# Patient Record
Sex: Male | Born: 1955 | Race: White | Hispanic: No | Smoking: Never smoker
Health system: Southern US, Community
[De-identification: ages and names within clinical notes are randomized; demographics above are authoritative.]

## PROBLEM LIST (undated history)

## (undated) DIAGNOSIS — F329 Major depressive disorder, single episode, unspecified: Secondary | ICD-10-CM

## (undated) DIAGNOSIS — R600 Localized edema: Secondary | ICD-10-CM

## (undated) DIAGNOSIS — Z9189 Other specified personal risk factors, not elsewhere classified: Secondary | ICD-10-CM

## (undated) DIAGNOSIS — Z8673 Personal history of transient ischemic attack (TIA), and cerebral infarction without residual deficits: Secondary | ICD-10-CM

## (undated) DIAGNOSIS — I319 Disease of pericardium, unspecified: Secondary | ICD-10-CM

## (undated) DIAGNOSIS — D126 Benign neoplasm of colon, unspecified: Secondary | ICD-10-CM

## (undated) DIAGNOSIS — E785 Hyperlipidemia, unspecified: Secondary | ICD-10-CM

## (undated) DIAGNOSIS — C801 Malignant (primary) neoplasm, unspecified: Secondary | ICD-10-CM

## (undated) DIAGNOSIS — M545 Low back pain, unspecified: Secondary | ICD-10-CM

## (undated) DIAGNOSIS — I10 Essential (primary) hypertension: Principal | ICD-10-CM

## (undated) DIAGNOSIS — K760 Fatty (change of) liver, not elsewhere classified: Secondary | ICD-10-CM

## (undated) DIAGNOSIS — M5136 Other intervertebral disc degeneration, lumbar region: Secondary | ICD-10-CM

## (undated) DIAGNOSIS — N529 Male erectile dysfunction, unspecified: Secondary | ICD-10-CM

## (undated) DIAGNOSIS — M51369 Other intervertebral disc degeneration, lumbar region without mention of lumbar back pain or lower extremity pain: Secondary | ICD-10-CM

## (undated) DIAGNOSIS — F418 Other specified anxiety disorders: Principal | ICD-10-CM

## (undated) HISTORY — PX: COLONOSCOPY: SHX174

## (undated) HISTORY — DX: Other specified anxiety disorders: F41.8

## (undated) HISTORY — PX: TONSILLECTOMY: SUR1361

## (undated) HISTORY — DX: Hyperlipidemia, unspecified: E78.5

## (undated) HISTORY — PX: OTHER SURGICAL HISTORY: SHX169

## (undated) HISTORY — DX: Benign neoplasm of colon, unspecified: D12.6

## (undated) HISTORY — DX: Localized edema: R60.0

## (undated) HISTORY — DX: Malignant (primary) neoplasm, unspecified: C80.1

## (undated) HISTORY — DX: Other intervertebral disc degeneration, lumbar region: M51.36

## (undated) HISTORY — DX: Other specified personal risk factors, not elsewhere classified: Z91.89

## (undated) HISTORY — DX: Fatty (change of) liver, not elsewhere classified: K76.0

## (undated) HISTORY — DX: Essential (primary) hypertension: I10

## (undated) HISTORY — DX: Disease of pericardium, unspecified: I31.9

## (undated) HISTORY — DX: Personal history of transient ischemic attack (TIA), and cerebral infarction without residual deficits: Z86.73

## (undated) HISTORY — DX: Male erectile dysfunction, unspecified: N52.9

## (undated) HISTORY — DX: Major depressive disorder, single episode, unspecified: F32.9

## (undated) HISTORY — DX: Low back pain, unspecified: M54.50

## (undated) HISTORY — DX: Other intervertebral disc degeneration, lumbar region without mention of lumbar back pain or lower extremity pain: M51.369

---

## 1984-05-22 DIAGNOSIS — I319 Disease of pericardium, unspecified: Secondary | ICD-10-CM

## 1984-05-22 HISTORY — DX: Disease of pericardium, unspecified: I31.9

## 2016-01-13 ENCOUNTER — Telehealth: Payer: Self-pay | Admitting: Behavioral Health

## 2016-01-13 NOTE — Telephone Encounter (Signed)
Patient declined to provide pre-visit info today, voicing that he will update all info at the appointment tomorrow.

## 2016-01-14 ENCOUNTER — Ambulatory Visit (INDEPENDENT_AMBULATORY_CARE_PROVIDER_SITE_OTHER): Payer: Managed Care, Other (non HMO) | Admitting: Family Medicine

## 2016-01-14 ENCOUNTER — Encounter: Payer: Self-pay | Admitting: Family Medicine

## 2016-01-14 DIAGNOSIS — I10 Essential (primary) hypertension: Secondary | ICD-10-CM

## 2016-01-14 DIAGNOSIS — F329 Major depressive disorder, single episode, unspecified: Secondary | ICD-10-CM | POA: Diagnosis not present

## 2016-01-14 DIAGNOSIS — G4700 Insomnia, unspecified: Secondary | ICD-10-CM | POA: Diagnosis not present

## 2016-01-14 DIAGNOSIS — F32A Depression, unspecified: Secondary | ICD-10-CM

## 2016-01-14 HISTORY — DX: Depression, unspecified: F32.A

## 2016-01-14 HISTORY — DX: Essential (primary) hypertension: I10

## 2016-01-14 MED ORDER — BUPROPION HCL 100 MG PO TABS: 100.0000 mg | ORAL_TABLET | Freq: Two times a day (BID) | ORAL | 3 refills | Status: DC

## 2016-01-14 MED ORDER — ZOLPIDEM TARTRATE 5 MG PO TABS: 5.0000 mg | ORAL_TABLET | Freq: Every evening | ORAL | 2 refills | Status: DC | PRN

## 2016-01-14 MED ORDER — LISINOPRIL 40 MG PO TABS: 40.0000 mg | ORAL_TABLET | Freq: Every day | ORAL | 3 refills | Status: DC

## 2016-01-14 NOTE — Progress Notes (Signed)
Pre visit review using our clinic review tool, if applicable. No additional management support is needed unless otherwise documented below in the visit note. 

## 2016-01-14 NOTE — Patient Instructions (Addendum)
Drink a big glass of water, maybe add Metamucil, before eating and eat slow. Bananas and pineapple have considerable amounts of sugar so eat these in moderation. White potatoes are not considered a "healthy" vegetable. Another option for portion control is to get a food scale and weigh your portions.  Consider water aerobics or cycling to help with exercise.  EXERCISES  RANGE OF MOTION (ROM) AND STRETCHING EXERCISES - Low Back Sprain Most people with lower back pain will find that their symptoms get worse with excessive bending forward (flexion) or arching at the lower back (extension). The exercises that will help resolve your symptoms will focus on the opposite motion.  Your physician, physical therapist or athletic trainer will help you determine which exercises will be most helpful to resolve your lower back pain. Do not complete any exercises without first consulting with your caregiver. Discontinue any exercises which make your symptoms worse, until you speak to your caregiver. If you have pain, numbness or tingling which travels down into your buttocks, leg or foot, the goal of the therapy is for these symptoms to move closer to your back and eventually resolve. Sometimes, these leg symptoms will get better, but your lower back pain may worsen. This is often an indication of progress in your rehabilitation. Be very alert to any changes in your symptoms and the activities in which you participated in the 24 hours prior to the change. Sharing this information with your caregiver will allow him or her to most efficiently treat your condition. These exercises may help you when beginning to rehabilitate your injury. Your symptoms may resolve with or without further involvement from your physician, physical therapist or athletic trainer. While completing these exercises, remember:   Restoring tissue flexibility helps normal motion to return to the joints. This allows healthier, less painful movement  and activity.  An effective stretch should be held for at least 30 seconds.  A stretch should never be painful. You should only feel a gentle lengthening or release in the stretched tissue. FLEXION RANGE OF MOTION AND STRETCHING EXERCISES:  STRETCH - Flexion, Single Knee to Chest   Lie on a firm bed or floor with both legs extended in front of you.  Keeping one leg in contact with the floor, bring your opposite knee to your chest. Hold your leg in place by either grabbing behind your thigh or at your knee.  Pull until you feel a gentle stretch in your low back. Hold __________ seconds.  Slowly release your grasp and repeat the exercise with the opposite side. Repeat __________ times. Complete this exercise __________ times per day.   STRETCH - Flexion, Double Knee to Chest  Lie on a firm bed or floor with both legs extended in front of you.  Keeping one leg in contact with the floor, bring your opposite knee to your chest.  Tense your stomach muscles to support your back and then lift your other knee to your chest. Hold your legs in place by either grabbing behind your thighs or at your knees.  Pull both knees toward your chest until you feel a gentle stretch in your low back. Hold __________ seconds.  Tense your stomach muscles and slowly return one leg at a time to the floor. Repeat __________ times. Complete this exercise __________ times per day.   STRETCH - Low Trunk Rotation  Lie on a firm bed or floor. Keeping your legs in front of you, bend your knees so they are both pointed toward  the ceiling and your feet are flat on the floor.  Extend your arms out to the side. This will stabilize your upper body by keeping your shoulders in contact with the floor.  Gently and slowly drop both knees together to one side until you feel a gentle stretch in your low back. Hold for __________ seconds.  Tense your stomach muscles to support your lower back as you bring your knees back to  the starting position. Repeat the exercise to the other side. Repeat __________ times. Complete this exercise __________ times per day  EXTENSION RANGE OF MOTION AND FLEXIBILITY EXERCISES:  STRETCH - Extension, Prone on Elbows   Lie on your stomach on the floor, a bed will be too soft. Place your palms about shoulder width apart and at the height of your head.  Place your elbows under your shoulders. If this is too painful, stack pillows under your chest.  Allow your body to relax so that your hips drop lower and make contact more completely with the floor.  Hold this position for __________ seconds.  Slowly return to lying flat on the floor. Repeat __________ times. Complete this exercise __________ times per day.   RANGE OF MOTION - Extension, Prone Press Ups  Lie on your stomach on the floor, a bed will be too soft. Place your palms about shoulder width apart and at the height of your head.  Keeping your back as relaxed as possible, slowly straighten your elbows while keeping your hips on the floor. You may adjust the placement of your hands to maximize your comfort. As you gain motion, your hands will come more underneath your shoulders.  Hold this position __________ seconds.  Slowly return to lying flat on the floor. Repeat __________ times. Complete this exercise __________ times per day.   RANGE OF MOTION- Quadruped, Neutral Spine   Assume a hands and knees position on a firm surface. Keep your hands under your shoulders and your knees under your hips. You may place padding under your knees for comfort.  Drop your head and point your tailbone toward the ground below you. This will round out your lower back like an angry cat. Hold this position for __________ seconds.  Slowly lift your head and release your tail bone so that your back sags into a large arch, like an old horse.  Hold this position for __________ seconds.  Repeat this until you feel limber in your low  back.  Now, find your "sweet spot." This will be the most comfortable position somewhere between the two previous positions. This is your neutral spine. Once you have found this position, tense your stomach muscles to support your low back.  Hold this position for __________ seconds. Repeat __________ times. Complete this exercise __________ times per day.  STRENGTHENING EXERCISES - Low Back Sprain These exercises may help you when beginning to rehabilitate your injury. These exercises should be done near your "sweet spot." This is the neutral, low-back arch, somewhere between fully rounded and fully arched, that is your least painful position. When performed in this safe range of motion, these exercises can be used for people who have either a flexion or extension based injury. These exercises may resolve your symptoms with or without further involvement from your physician, physical therapist or athletic trainer. While completing these exercises, remember:   Muscles can gain both the endurance and the strength needed for everyday activities through controlled exercises.  Complete these exercises as instructed by your physician, physical  therapist or athletic trainer. Increase the resistance and repetitions only as guided.  You may experience muscle soreness or fatigue, but the pain or discomfort you are trying to eliminate should never worsen during these exercises. If this pain does worsen, stop and make certain you are following the directions exactly. If the pain is still present after adjustments, discontinue the exercise until you can discuss the trouble with your caregiver.  STRENGTHENING - Deep Abdominals, Pelvic Tilt   Lie on a firm bed or floor. Keeping your legs in front of you, bend your knees so they are both pointed toward the ceiling and your feet are flat on the floor.  Tense your lower abdominal muscles to press your low back into the floor. This motion will rotate your pelvis so  that your tail bone is scooping upwards rather than pointing at your feet or into the floor. With a gentle tension and even breathing, hold this position for __________ seconds. Repeat __________ times. Complete this exercise __________ times per day.   STRENGTHENING - Abdominals, Crunches   Lie on a firm bed or floor. Keeping your legs in front of you, bend your knees so they are both pointed toward the ceiling and your feet are flat on the floor. Cross your arms over your chest.  Slightly tip your chin down without bending your neck.  Tense your abdominals and slowly lift your trunk high enough to just clear your shoulder blades. Lifting higher can put excessive stress on the lower back and does not further strengthen your abdominal muscles.  Control your return to the starting position. Repeat __________ times. Complete this exercise __________ times per day.   STRENGTHENING - Quadruped, Opposite UE/LE Lift   Assume a hands and knees position on a firm surface. Keep your hands under your shoulders and your knees under your hips. You may place padding under your knees for comfort.  Find your neutral spine and gently tense your abdominal muscles so that you can maintain this position. Your shoulders and hips should form a rectangle that is parallel with the floor and is not twisted.  Keeping your trunk steady, lift your right hand no higher than your shoulder and then your left leg no higher than your hip. Make sure you are not holding your breath. Hold this position for __________ seconds.  Continuing to keep your abdominal muscles tense and your back steady, slowly return to your starting position. Repeat with the opposite arm and leg. Repeat __________ times. Complete this exercise __________ times per day.   STRENGTHENING - Abdominals and Quadriceps, Straight Leg Raise   Lie on a firm bed or floor with both legs extended in front of you.  Keeping one leg in contact with the floor,  bend the other knee so that your foot can rest flat on the floor.  Find your neutral spine, and tense your abdominal muscles to maintain your spinal position throughout the exercise.  Slowly lift your straight leg off the floor about 6 inches for a count of 15, making sure to not hold your breath.  Still keeping your neutral spine, slowly lower your leg all the way to the floor. Repeat this exercise with each leg __________ times. Complete this exercise __________ times per day. POSTURE AND BODY MECHANICS CONSIDERATIONS - Low Back Sprain Keeping correct posture when sitting, standing or completing your activities will reduce the stress put on different body tissues, allowing injured tissues a chance to heal and limiting painful experiences. The following  are general guidelines for improved posture. Your physician or physical therapist will provide you with any instructions specific to your needs. While reading these guidelines, remember:  The exercises prescribed by your provider will help you have the flexibility and strength to maintain correct postures.  The correct posture provides the best environment for your joints to work. All of your joints have less wear and tear when properly supported by a spine with good posture. This means you will experience a healthier, less painful body.  Correct posture must be practiced with all of your activities, especially prolonged sitting and standing. Correct posture is as important when doing repetitive low-stress activities (typing) as it is when doing a single heavy-load activity (lifting).  RESTING POSITIONS Consider which positions are most painful for you when choosing a resting position. If you have pain with flexion-based activities (sitting, bending, stooping, squatting), choose a position that allows you to rest in a less flexed posture. You would want to avoid curling into a fetal position on your side. If your pain worsens with extension-based  activities (prolonged standing, working overhead), avoid resting in an extended position such as sleeping on your stomach. Most people will find more comfort when they rest with their spine in a more neutral position, neither too rounded nor too arched. Lying on a non-sagging bed on your side with a pillow between your knees, or on your back with a pillow under your knees will often provide some relief. Keep in mind, being in any one position for a prolonged period of time, no matter how correct your posture, can still lead to stiffness. PROPER SITTING POSTURE In order to minimize stress and discomfort on your spine, you must sit with correct posture. Sitting with good posture should be effortless for a healthy body. Returning to good posture is a gradual process. Many people can work toward this most comfortably by using various supports until they have the flexibility and strength to maintain this posture on their own. When sitting with proper posture, your ears will fall over your shoulders and your shoulders will fall over your hips. You should use the back of the chair to support your upper back. Your lower back will be in a neutral position, just slightly arched. You may place a small pillow or folded towel at the base of your lower back for  support.  When working at a desk, create an environment that supports good, upright posture. Without extra support, muscles tire, which leads to excessive strain on joints and other tissues. Keep these recommendations in mind:  CHAIR:  A chair should be able to slide under your desk when your back makes contact with the back of the chair. This allows you to work closely.  The chair's height should allow your eyes to be level with the upper part of your monitor and your hands to be slightly lower than your elbows.  BODY POSITION  Your feet should make contact with the floor. If this is not possible, use a foot rest.  Keep your ears over your shoulders.  This will reduce stress on your neck and low back.  INCORRECT SITTING POSTURES  If you are feeling tired and unable to assume a healthy sitting posture, do not slouch or slump. This puts excessive strain on your back tissues, causing more damage and pain. Healthier options include:  Using more support, like a lumbar pillow.  Switching tasks to something that requires you to be upright or walking.  Talking a brief  walk.  Lying down to rest in a neutral-spine position.  PROLONGED STANDING WHILE SLIGHTLY LEANING FORWARD  When completing a task that requires you to lean forward while standing in one place for a long time, place either foot up on a stationary 2-4 inch high object to help maintain the best posture. When both feet are on the ground, the lower back tends to lose its slight inward curve. If this curve flattens (or becomes too large), then the back and your other joints will experience too much stress, tire more quickly, and can cause pain.  CORRECT STANDING POSTURES Proper standing posture should be assumed with all daily activities, even if they only take a few moments, like when brushing your teeth. As in sitting, your ears should fall over your shoulders and your shoulders should fall over your hips. You should keep a slight tension in your abdominal muscles to brace your spine. Your tailbone should point down to the ground, not behind your body, resulting in an over-extended swayback posture.   INCORRECT STANDING POSTURES  Common incorrect standing postures include a forward head, locked knees and/or an excessive swayback. WALKING Walk with an upright posture. Your ears, shoulders and hips should all line-up.  PROLONGED ACTIVITY IN A FLEXED POSITION When completing a task that requires you to bend forward at your waist or lean over a low surface, try to find a way to stabilize 3 out of 4 of your limbs. You can place a hand or elbow on your thigh or rest a knee on the surface you  are reaching across. This will provide you more stability, so that your muscles do not tire as quickly. By keeping your knees relaxed, or slightly bent, you will also reduce stress across your lower back. CORRECT LIFTING TECHNIQUES  DO :  Assume a wide stance. This will provide you more stability and the opportunity to get as close as possible to the object which you are lifting.  Tense your abdominals to brace your spine. Bend at the knees and hips. Keeping your back locked in a neutral-spine position, lift using your leg muscles. Lift with your legs, keeping your back straight.  Test the weight of unknown objects before attempting to lift them.  Try to keep your elbows locked down at your sides in order get the best strength from your shoulders when carrying an object.     Always ask for help when lifting heavy or awkward objects. INCORRECT LIFTING TECHNIQUES DO NOT:   Lock your knees when lifting, even if it is a small object.  Bend and twist. Pivot at your feet or move your feet when needing to change directions.  Assume that you can safely pick up even a paperclip without proper posture.  This information is not intended to replace advice given to you by your health care provider. Make sure you discuss any questions you have with your health care provider.  Document Released: 05/08/2005 Document Revised: 05/29/2014 Document Reviewed: 08/20/2008 Elsevier Interactive Patient Education [employer].

## 2016-01-14 NOTE — Progress Notes (Signed)
Chief Complaint  Patient presents with  . New Patient (Initial Visit)       New Patient Visit SUBJECTIVE: HPI: Javier King is an 60 y.o.male who is being seen for establishing care.  The patient was previously seen at [facility].  Health maintenance history: PSA: Unsure Colonoscopy: Done in 2013; normal due in 2023 Routine blood work: Earlier in the year, unsure what he checked HIV/Hep C screening: Has been screened for HIV, unsure about Hep C Tetanus: Unsure, would like to wait for the records  Concerns: None at this time.  Hypertension Patient presents for hypertension follow up. He does monitor home blood pressures - hasn't been taking medications routinely since his move.  He is compliant with medication- Lisinopril 40 mg daily. Patient has these side effects of medication: none He specifically denies headache, visual changes, chest pain, palpitations, dyspnea, orthopnea, PND or peripheral edema. He is adhering to a low sodium and low fat diet. He exercises rarely.  ROS:  Psych: +racing thoughts Heart: +LE swelling, no chest pain Lungs: No SOB  Past Medical History:  Diagnosis Date  . Depression 01/14/2016  . Essential hypertension 01/14/2016   Past Surgical History:  Procedure Laterality Date  . OTHER SURGICAL HISTORY  2003   Back surgery   Social History   Social History  . Marital status: Divorced   Social History Main Topics  . Smoking status: Never Smoker  . Smokeless tobacco: Never Used  . Alcohol use 1.8 oz/week    1 Glasses of wine, 1 Cans of beer, 1 Shots of liquor per week     Comment: occassionall  . Drug use: No  . Sexual activity: Not Currently   No family history on file.   Current Outpatient Prescriptions:  .  buPROPion (WELLBUTRIN) 100 MG tablet, Take 1 tablet (100 mg total) by mouth 2 (two) times daily., Disp: 180 tablet, Rfl: 3 .  lisinopril (PRINIVIL,ZESTRIL) 40 MG tablet, Take 1 tablet (40 mg total) by mouth daily., Disp: 90 tablet,  Rfl: 3 .  zolpidem (AMBIEN) 5 MG tablet, Take 1 tablet (5 mg total) by mouth at bedtime as needed for sleep., Disp: 30 tablet, Rfl: 2  ROS Consitutional: Denies fevers, chills  Cardiovascular: Denies chest pain or pressure, palpitations  Respiratory: Denies dyspnea, cough   OBJECTIVE: BP (!) 155/80 (BP Location: Right Arm, Cuff Size: Large)   Pulse 73   Temp 98.4 F (36.9 C) (Oral)   Resp 17   Ht 6' (1.829 m)   Wt 268 lb 9.6 oz (121.8 kg)   SpO2 98%   BMI 36.43 kg/m   Constitutional: -  VS reviewed -  Well developed, well nourished, appears stated age -  No apparent distress  Psychiatric: -  Oriented to person, place, and time -  Memory intact -  Affect and mood normal -  Fluent conversation, good eye contact -  Judgment and insight age appropriate  ENMT: -  Ears are patent b/l without erythema or discharge. TM's are shiny and clear b/l without evidence of effusion or infection. -  Oral mucosa without lesions, tongue and uvula midline    Tonsils not enlarged, no erythema, no exudate, trachea midline    Pharynx moist, no lesions, no erythema  Neck: -  No gross swelling, no palpable masses -  Thyroid midline, not enlarged, mobile, no palpable masses  Cardiovascular: -  RRR, no murmurs -  1+ pitting edema b/l up to distal 1/3 of tibia b/l  Respiratory: -  Normal  respiratory effort, no accessory muscle use, no retraction -  Breath sounds equal, no wheezes, no ronchi, no crackles  Musculoskeletal: -  No clubbing, no cyanosis -  Gait normal  Skin: -  No significant lesion on inspection -  Warm and dry to palpation   ASSESSMENT/PLAN: Essential hypertension  Depression  Insomnia  Patient instructed to sign release of records form for her previous PCP. Will hold off on labs and imms for now. BP is elevated, but he has not been taking his medications routinely. Discussed his long-term use of Ambien. He uses it on weeknights due to racing thoughts and need for sleep. He has  been using it for 10 years. I stated that I will not stop him cold [place] today, but would like him to consider other options for sleep in the future. I suspect he has an anxiety component that is not adequately treated causing this. He did agree to consider this and voiced understanding of my future vision of his health. Patient should return in 4 weeks to recheck BP. The patient voiced understanding and agreement to the plan.   [place]

## 2016-01-20 ENCOUNTER — Telehealth: Payer: Self-pay | Admitting: Family Medicine

## 2016-01-20 ENCOUNTER — Encounter: Payer: Self-pay | Admitting: Family Medicine

## 2016-01-20 DIAGNOSIS — D369 Benign neoplasm, unspecified site: Secondary | ICD-10-CM | POA: Insufficient documentation

## 2016-01-20 DIAGNOSIS — Z9189 Other specified personal risk factors, not elsewhere classified: Secondary | ICD-10-CM | POA: Insufficient documentation

## 2016-01-20 DIAGNOSIS — E785 Hyperlipidemia, unspecified: Secondary | ICD-10-CM

## 2016-01-20 HISTORY — DX: Other specified personal risk factors, not elsewhere classified: Z91.89

## 2016-01-20 NOTE — Telephone Encounter (Signed)
Review of Medical Records  Javier King is a 60 y.o. male last seen on 01/14/2016 to establish with my practice as a new patient.  Javier King has a medical history and records which I felt required additional time spent outside of his office visit for the purpose of reviewing his records.  Available records were reviewed in detail and updates to Javier King medical records include:  Update problem list: Lumbar DDD- XR 01/16/14 showed this Erectile dysfunction History of TIA/Stroke Hx of abnormal liver enzymes Hyperlipidemia Tubular adenoma of colon (scope done 02/17/10)-needed 5 year follow up, not 10; according to his records, he was due in 01/2015; the polyp was found in the descending colon.  Famhx Mother- heart disease Sibilings: DM II, leukemia  Allergies: PCN  Caffeine intake: 2-3 cups of coffee daily  Medication list Cialis 5 mg q 24 hrs  Flonase 50 mcg, 2 sprays each nostril daily Gabapentin 300 mg TID He has tried and failed Celexa for his anxiety/depression  Labs: CMP and CBC unremarkable; lipid profile showed HDL of 61, TG's of 158, and LDL of 124, 10 yr CVD is >7.5% combined with his BP at his visit with me.   Past Medical History:  Diagnosis Date  . 10 year risk of MI or stroke 7.5% or greater 01/20/2016  . DDD (degenerative disc disease), lumbar   . Depression 01/14/2016  . Erectile dysfunction   . Essential hypertension 01/14/2016  . History of TIA (transient ischemic attack)   . Hyperlipidemia   . Tubular adenoma of colon    02/17/10    Past Surgical History:  Procedure Laterality Date  . OTHER SURGICAL HISTORY  2003   Back surgery    Family History  Problem Relation Age of Onset  . Heart disease Mother   . Diabetes Sister     Social History   Social History  . Marital status: Divorced   Social History Main Topics  . Smoking status: Never Smoker  . Smokeless tobacco: Never Used  . Alcohol use 1.8 oz/week    1 Glasses of wine, 1 Cans of beer, 1 Shots  of liquor per week     Comment: occassionall  . Drug use: No  . Sexual activity: Not Currently    Current Outpatient Prescriptions on File Prior to Visit  Medication Sig Dispense Refill  . buPROPion (WELLBUTRIN) 100 MG tablet Take 1 tablet (100 mg total) by mouth 2 (two) times daily. 180 tablet 3  . lisinopril (PRINIVIL,ZESTRIL) 40 MG tablet Take 1 tablet (40 mg total) by mouth daily. 90 tablet 3  . zolpidem (AMBIEN) 5 MG tablet Take 1 tablet (5 mg total) by mouth at bedtime as needed for sleep. 30 tablet 2   Additional health maintenance recommendations and orders following his initial visit include the following:  Hyperlipidemia  10 year risk of MI or stroke 7.5% or greater  Tubular adenoma  I did not see any evidence of immunization records other than a note that showed he received an influenza vaccination.  I recommended Javier King follow up in 3 week(s) as originally planned.  Total service time without direct contact: 35 minutes.  Javier King

## 2016-01-21 ENCOUNTER — Encounter: Payer: Self-pay | Admitting: Family Medicine

## 2016-02-17 ENCOUNTER — Ambulatory Visit (INDEPENDENT_AMBULATORY_CARE_PROVIDER_SITE_OTHER): Payer: Managed Care, Other (non HMO) | Admitting: Family Medicine

## 2016-02-17 ENCOUNTER — Encounter: Payer: Self-pay | Admitting: Family Medicine

## 2016-02-17 VITALS — BP 130/68 | HR 82 | Temp 97.7°F | Ht 72.0 in | Wt 269.2 lb

## 2016-02-17 DIAGNOSIS — F418 Other specified anxiety disorders: Secondary | ICD-10-CM

## 2016-02-17 DIAGNOSIS — I10 Essential (primary) hypertension: Secondary | ICD-10-CM

## 2016-02-17 DIAGNOSIS — D369 Benign neoplasm, unspecified site: Secondary | ICD-10-CM

## 2016-02-17 DIAGNOSIS — Z9189 Other specified personal risk factors, not elsewhere classified: Secondary | ICD-10-CM | POA: Diagnosis not present

## 2016-02-17 HISTORY — DX: Other specified anxiety disorders: F41.8

## 2016-02-17 MED ORDER — DULOXETINE HCL 30 MG PO CPEP: 30.0000 mg | ORAL_CAPSULE | Freq: Every day | ORAL | 0 refills | Status: DC

## 2016-02-17 NOTE — Progress Notes (Signed)
Chief Complaint  Patient presents with  . Follow-up    on BP  . Anxiety    along trouble sleeping    Subjective Javier King is a 60 y.o. male who presents for hypertension follow up. He does not monitor home blood pressures. He is compliant with medications- Lisinopril 40 mg daily. Patient has these side effects of medication: none He is adhering to a low sodium and low fat diet. Current exercise: rare walking   Anxiety +Hx of anxiety. Poor sleep- will wake up in middle of night and be unable to return to sleep, racing thoughts, anxious thoughts. May be a component of depression as well. He has been on Zoloft and Prozac in the past, but had sexual side effects with both. No thoughts of harming self or others. No self medication with alcohol. rx drugs or illicit drugs. He does not follow with a counselor and is not currently on any medication for this other then Ambien, which he was started on in the past.  Tubular adenoma +hx of tubular adenoma with recommended f/u in 5 years. He was due in 2016 and confirms that he has not had a follow up colonoscopy just yet.  Cholesterol 10 yr CVD is >7.5%. He did have TIA listed in his medical history, but he questions this diagnosis. He is not on a statin.   Past Medical History:  Diagnosis Date  . 10 year risk of MI or stroke 7.5% or greater 01/20/2016  . 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 DDD (degenerative disc disease), lumbar   . Depression 01/14/2016  . Erectile dysfunction   . Essential hypertension 01/14/2016  . History of TIA (transient ischemic attack)   . Hyperlipidemia   . Tubular adenoma of colon    02/17/10   Family History  Problem Relation Age of Onset  . Heart disease Mother   . Diabetes Sister      Medications Current Outpatient Prescriptions on File Prior to Visit  Medication Sig Dispense Refill  . buPROPion (WELLBUTRIN) 100 MG tablet Take 1 tablet (100 mg total) by mouth 2 (two) times daily. 180  tablet 3  . lisinopril (PRINIVIL,ZESTRIL) 40 MG tablet Take 1 tablet (40 mg total) by mouth daily. 90 tablet 3  . zolpidem (AMBIEN) 5 MG tablet Take 1 tablet (5 mg total) by mouth at bedtime as needed for sleep. 30 tablet 2   Allergies Allergies  Allergen Reactions  . Penicillins Itching    Review of Systems Eye:  no recent significant change in vision Cardiovascular:  no exercise intolerance, no chest pain, no palpitations Respiratory:  no cough or shortness of breath Psych: No homicidal or suicidal thoughts  Exam BP 130/68 (BP Location: Left Arm, Patient Position: Sitting, Cuff Size: Large)   Pulse 82   Temp 97.7 F (36.5 C) (Oral)   Ht 6' (1.829 m)   Wt 269 lb 3.2 oz (122.1 kg)   SpO2 97%   BMI 36.51 kg/m  General:  well developed, well nourished, in no apparent distress Skin:  warm, no pallor or diaphoresis Eyes:  pupils equal and round, sclera anicteric without injection Neck: neck supple without adenopathy, thyromegaly, masses, or bruits  Lungs:  clear to auscultation, breath sounds equal bilaterally Cardio:  regular rate and rhythm without murmurs, heart sounds without clicks or rubs Abdomen:  abdomen soft, nontender; bowel sounds normal; no masses or organomegaly Musculoskeletal:  symmetrical muscle groups noted without atrophy or deformity Extremities:  no clubbing, cyanosis, or edema, no  deformities, no skin discoloration Psych: well oriented with normal range of affect and appropriate judgment/insight  Anxiety associated with depression - Plan: DULoxetine (CYMBALTA) 30 MG capsule  Essential hypertension  Tubular adenoma - Plan: Ambulatory referral to Gastroenterology  10 year risk of MI or stroke 7.5% or greater  Orders as above. Continue current therapy for HTN. Start SNRI for anxiety, could try Trazodone vs Wellbutrin vs Effexor vs Elavil in future. Recommended counseling as well and provided number to call if he changes his mind. Refer to GI for  colonoscopy to recheck adenoma.  Opted for lifestyle modifications regarding cholesterol/risk; will recheck lipids in 6 mo. F/u in 4-6 weeks to recheck anxiety. The patient voiced understanding and agreement to the plan.  [place], DO 02/17/16  8:45 AM

## 2016-02-17 NOTE — Patient Instructions (Addendum)
[phone]: number for counseling  Continue with your blood pressure medicine.  Exercise, 150 min/week, generally 30 minutes on 5 days of the week is recommended.

## 2016-02-17 NOTE — Progress Notes (Signed)
Pre visit review using our clinic review tool, if applicable. No additional management support is needed unless otherwise documented below in the visit note. 

## 2016-03-23 ENCOUNTER — Telehealth: Payer: Self-pay | Admitting: Internal Medicine

## 2016-03-30 ENCOUNTER — Encounter: Payer: Self-pay | Admitting: Family Medicine

## 2016-03-30 ENCOUNTER — Ambulatory Visit (INDEPENDENT_AMBULATORY_CARE_PROVIDER_SITE_OTHER): Payer: Managed Care, Other (non HMO) | Admitting: Family Medicine

## 2016-03-30 VITALS — BP 152/84 | HR 78 | Temp 98.2°F | Ht 73.0 in | Wt 267.4 lb

## 2016-03-30 DIAGNOSIS — M545 Low back pain, unspecified: Secondary | ICD-10-CM

## 2016-03-30 DIAGNOSIS — F418 Other specified anxiety disorders: Secondary | ICD-10-CM

## 2016-03-30 DIAGNOSIS — I10 Essential (primary) hypertension: Secondary | ICD-10-CM | POA: Diagnosis not present

## 2016-03-30 MED ORDER — VENLAFAXINE HCL ER 37.5 MG PO CP24: 37.5000 mg | ORAL_CAPSULE | Freq: Every day | ORAL | 1 refills | Status: DC

## 2016-03-30 MED ORDER — CYCLOBENZAPRINE HCL 5 MG PO TABS: 5.0000 mg | ORAL_TABLET | Freq: Three times a day (TID) | ORAL | 0 refills | Status: DC | PRN

## 2016-03-30 NOTE — Progress Notes (Signed)
Pre visit review using our clinic review tool, if applicable. No additional management support is needed unless otherwise documented below in the visit note. 

## 2016-03-30 NOTE — Patient Instructions (Addendum)
Please consider counseling. The medical literature and evidence-based guidelines support it. Contact [phone] to schedule an appointment or inquire about cost/insurance coverage.  EXERCISES  RANGE OF MOTION (ROM) AND STRETCHING EXERCISES - Low Back Sprain Most people with lower back pain will find that their symptoms get worse with excessive bending forward (flexion) or arching at the lower back (extension). The exercises that will help resolve your symptoms will focus on the opposite motion.  Your physician, physical therapist or athletic trainer will help you determine which exercises will be most helpful to resolve your lower back pain. Do not complete any exercises without first consulting with your caregiver. Discontinue any exercises which make your symptoms worse, until you speak to your caregiver. If you have pain, numbness or tingling which travels down into your buttocks, leg or foot, the goal of the therapy is for these symptoms to move closer to your back and eventually resolve. Sometimes, these leg symptoms will get better, but your lower back pain may worsen. This is often an indication of progress in your rehabilitation. Be very alert to any changes in your symptoms and the activities in which you participated in the 24 hours prior to the change. Sharing this information with your caregiver will allow him or her to most efficiently treat your condition. These exercises may help you when beginning to rehabilitate your injury. Your symptoms may resolve with or without further involvement from your physician, physical therapist or athletic trainer. While completing these exercises, remember:   Restoring tissue flexibility helps normal motion to return to the joints. This allows healthier, less painful movement and activity.  An effective stretch should be held for at least 30 seconds.  A stretch should never be painful. You should only feel a gentle lengthening or release in the  stretched tissue. FLEXION RANGE OF MOTION AND STRETCHING EXERCISES:  STRETCH - Flexion, Single Knee to Chest   Lie on a firm bed or floor with both legs extended in front of you.  Keeping one leg in contact with the floor, bring your opposite knee to your chest. Hold your leg in place by either grabbing behind your thigh or at your knee.  Pull until you feel a gentle stretch in your low back. Hold 15-20 seconds.  Slowly release your grasp and repeat the exercise with the opposite side. Repeat 2 times. Complete this exercise 1-2 times per day.   STRETCH - Flexion, Double Knee to Chest  Lie on a firm bed or floor with both legs extended in front of you.  Keeping one leg in contact with the floor, bring your opposite knee to your chest.  Tense your stomach muscles to support your back and then lift your other knee to your chest. Hold your legs in place by either grabbing behind your thighs or at your knees.  Pull both knees toward your chest until you feel a gentle stretch in your low back. Hold 15-20 seconds.  Tense your stomach muscles and slowly return one leg at a time to the floor. Repeat 2 times. Complete this exercise 1-2 times per day.   STRETCH - Low Trunk Rotation  Lie on a firm bed or floor. Keeping your legs in front of you, bend your knees so they are both pointed toward the ceiling and your feet are flat on the floor.  Extend your arms out to the side. This will stabilize your upper body by keeping your shoulders in contact with the floor.  Gently and slowly drop  both knees together to one side until you feel a gentle stretch in your low back. Hold for 15-20 seconds.  Tense your stomach muscles to support your lower back as you bring your knees back to the starting position. Repeat the exercise to the other side. Repeat 2 times. Complete this exercise 1-2 times per day  EXTENSION RANGE OF MOTION AND FLEXIBILITY EXERCISES:  STRETCH - Extension, Prone on Elbows   Lie  on your stomach on the floor, a bed will be too soft. Place your palms about shoulder width apart and at the height of your head.  Place your elbows under your shoulders. If this is too painful, stack pillows under your chest.  Allow your body to relax so that your hips drop lower and make contact more completely with the floor.  Hold this position for 15-20 seconds.  Slowly return to lying flat on the floor. Repeat 2 times. Complete this exercise 1-2 times per day.   RANGE OF MOTION - Extension, Prone Press Ups  Lie on your stomach on the floor, a bed will be too soft. Place your palms about shoulder width apart and at the height of your head.  Keeping your back as relaxed as possible, slowly straighten your elbows while keeping your hips on the floor. You may adjust the placement of your hands to maximize your comfort. As you gain motion, your hands will come more underneath your shoulders.  Hold this position 15-20 seconds.  Slowly return to lying flat on the floor. Repeat 2 times. Complete this exercise 1-2 times per day.   RANGE OF MOTION- Quadruped, Neutral Spine   Assume a hands and knees position on a firm surface. Keep your hands under your shoulders and your knees under your hips. You may place padding under your knees for comfort.  Drop your head and point your tailbone toward the ground below you. This will round out your lower back like an angry cat. Hold this position for 15-20 seconds.  Slowly lift your head and release your tail bone so that your back sags into a large arch, like an old horse.  Hold this position for 15-20 seconds.  Repeat this until you feel limber in your low back.  Now, find your "sweet spot." This will be the most comfortable position somewhere between the two previous positions. This is your neutral spine. Once you have found this position, tense your stomach muscles to support your low back.  Hold this position for 15-20 seconds. Repeat 2  times. Complete this exercise 1-2 times per day.  STRENGTHENING EXERCISES - Low Back Sprain These exercises may help you when beginning to rehabilitate your injury. These exercises should be done near your "sweet spot." This is the neutral, low-back arch, somewhere between fully rounded and fully arched, that is your least painful position. When performed in this safe range of motion, these exercises can be used for people who have either a flexion or extension based injury. These exercises may resolve your symptoms with or without further involvement from your physician, physical therapist or athletic trainer. While completing these exercises, remember:   Muscles can gain both the endurance and the strength needed for everyday activities through controlled exercises.  Complete these exercises as instructed by your physician, physical therapist or athletic trainer. Increase the resistance and repetitions only as guided.  You may experience muscle soreness or fatigue, but the pain or discomfort you are trying to eliminate should never worsen during these exercises. If this  pain does worsen, stop and make certain you are following the directions exactly. If the pain is still present after adjustments, discontinue the exercise until you can discuss the trouble with your caregiver.  STRENGTHENING - Deep Abdominals, Pelvic Tilt   Lie on a firm bed or floor. Keeping your legs in front of you, bend your knees so they are both pointed toward the ceiling and your feet are flat on the floor.  Tense your lower abdominal muscles to press your low back into the floor. This motion will rotate your pelvis so that your tail bone is scooping upwards rather than pointing at your feet or into the floor. With a gentle tension and even breathing, hold this position for 10-15 seconds. Repeat 2 times. Complete this exercise 1 time per day.   STRENGTHENING - Abdominals, Crunches   Lie on a firm bed or floor. Keeping  your legs in front of you, bend your knees so they are both pointed toward the ceiling and your feet are flat on the floor. Cross your arms over your chest.  Slightly tip your chin down without bending your neck.  Tense your abdominals and slowly lift your trunk high enough to just clear your shoulder blades. Lifting higher can put excessive stress on the lower back and does not further strengthen your abdominal muscles.  Control your return to the starting position. Repeat 2 times. Complete this exercise once every 1-2 days.   STRENGTHENING - Quadruped, Opposite UE/LE Lift   Assume a hands and knees position on a firm surface. Keep your hands under your shoulders and your knees under your hips. You may place padding under your knees for comfort.  Find your neutral spine and gently tense your abdominal muscles so that you can maintain this position. Your shoulders and hips should form a rectangle that is parallel with the floor and is not twisted.  Keeping your trunk steady, lift your right hand no higher than your shoulder and then your left leg no higher than your hip. Make sure you are not holding your breath. Hold this position for 15-20 seconds.  Continuing to keep your abdominal muscles tense and your back steady, slowly return to your starting position. Repeat with the opposite arm and leg. Repeat 2 times. Complete this exercise once every 1-2 days.   STRENGTHENING - Abdominals and Quadriceps, Straight Leg Raise   Lie on a firm bed or floor with both legs extended in front of you.  Keeping one leg in contact with the floor, bend the other knee so that your foot can rest flat on the floor.  Find your neutral spine, and tense your abdominal muscles to maintain your spinal position throughout the exercise.  Slowly lift your straight leg off the floor about 6 inches for a count of 15, making sure to not hold your breath.  Still keeping your neutral spine, slowly lower your leg all  the way to the floor. Repeat this exercise with each leg 2 times. Complete this exercise once every 1-2 days. POSTURE AND BODY MECHANICS CONSIDERATIONS - Low Back Sprain Keeping correct posture when sitting, standing or completing your activities will reduce the stress put on different body tissues, allowing injured tissues a chance to heal and limiting painful experiences. The following are general guidelines for improved posture. Your physician or physical therapist will provide you with any instructions specific to your needs. While reading these guidelines, remember:  The exercises prescribed by your provider will help you have the  flexibility and strength to maintain correct postures.  The correct posture provides the best environment for your joints to work. All of your joints have less wear and tear when properly supported by a spine with good posture. This means you will experience a healthier, less painful body.  Correct posture must be practiced with all of your activities, especially prolonged sitting and standing. Correct posture is as important when doing repetitive low-stress activities (typing) as it is when doing a single heavy-load activity (lifting).  RESTING POSITIONS Consider which positions are most painful for you when choosing a resting position. If you have pain with flexion-based activities (sitting, bending, stooping, squatting), choose a position that allows you to rest in a less flexed posture. You would want to avoid curling into a fetal position on your side. If your pain worsens with extension-based activities (prolonged standing, working overhead), avoid resting in an extended position such as sleeping on your stomach. Most people will find more comfort when they rest with their spine in a more neutral position, neither too rounded nor too arched. Lying on a non-sagging bed on your side with a pillow between your knees, or on your back with a pillow under your knees will  often provide some relief. Keep in mind, being in any one position for a prolonged period of time, no matter how correct your posture, can still lead to stiffness. PROPER SITTING POSTURE In order to minimize stress and discomfort on your spine, you must sit with correct posture. Sitting with good posture should be effortless for a healthy body. Returning to good posture is a gradual process. Many people can work toward this most comfortably by using various supports until they have the flexibility and strength to maintain this posture on their own. When sitting with proper posture, your ears will fall over your shoulders and your shoulders will fall over your hips. You should use the back of the chair to support your upper back. Your lower back will be in a neutral position, just slightly arched. You may place a small pillow or folded towel at the base of your lower back for  support.  When working at a desk, create an environment that supports good, upright posture. Without extra support, muscles tire, which leads to excessive strain on joints and other tissues. Keep these recommendations in mind:  CHAIR:  A chair should be able to slide under your desk when your back makes contact with the back of the chair. This allows you to work closely.  The chair's height should allow your eyes to be level with the upper part of your monitor and your hands to be slightly lower than your elbows.  BODY POSITION  Your feet should make contact with the floor. If this is not possible, use a foot rest.  Keep your ears over your shoulders. This will reduce stress on your neck and low back.  INCORRECT SITTING POSTURES  If you are feeling tired and unable to assume a healthy sitting posture, do not slouch or slump. This puts excessive strain on your back tissues, causing more damage and pain. Healthier options include:  Using more support, like a lumbar pillow.  Switching tasks to something that requires you to  be upright or walking.  Talking a brief walk.  Lying down to rest in a neutral-spine position.  PROLONGED STANDING WHILE SLIGHTLY LEANING FORWARD  When completing a task that requires you to lean forward while standing in one place for a long time, place  either foot up on a stationary 2-4 inch high object to help maintain the best posture. When both feet are on the ground, the lower back tends to lose its slight inward curve. If this curve flattens (or becomes too large), then the back and your other joints will experience too much stress, tire more quickly, and can cause pain.  CORRECT STANDING POSTURES Proper standing posture should be assumed with all daily activities, even if they only take a few moments, like when brushing your teeth. As in sitting, your ears should fall over your shoulders and your shoulders should fall over your hips. You should keep a slight tension in your abdominal muscles to brace your spine. Your tailbone should point down to the ground, not behind your body, resulting in an over-extended swayback posture.   INCORRECT STANDING POSTURES  Common incorrect standing postures include a forward head, locked knees and/or an excessive swayback. WALKING Walk with an upright posture. Your ears, shoulders and hips should all line-up.  PROLONGED ACTIVITY IN A FLEXED POSITION When completing a task that requires you to bend forward at your waist or lean over a low surface, try to find a way to stabilize 3 out of 4 of your limbs. You can place a hand or elbow on your thigh or rest a knee on the surface you are reaching across. This will provide you more stability, so that your muscles do not tire as quickly. By keeping your knees relaxed, or slightly bent, you will also reduce stress across your lower back. CORRECT LIFTING TECHNIQUES  DO :  Assume a wide stance. This will provide you more stability and the opportunity to get as close as possible to the object which you are  lifting.  Tense your abdominals to brace your spine. Bend at the knees and hips. Keeping your back locked in a neutral-spine position, lift using your leg muscles. Lift with your legs, keeping your back straight.  Test the weight of unknown objects before attempting to lift them.  Try to keep your elbows locked down at your sides in order get the best strength from your shoulders when carrying an object.     Always ask for help when lifting heavy or awkward objects. INCORRECT LIFTING TECHNIQUES DO NOT:   Lock your knees when lifting, even if it is a small object.  Bend and twist. Pivot at your feet or move your feet when needing to change directions.  Assume that you can safely pick up even a paperclip without proper posture.

## 2016-03-30 NOTE — Progress Notes (Signed)
Chief Complaint  Patient presents with  . Follow-up    pt states he feel like he is coming down with the flu/ Pt also reports new anxiety rx helps but has issues with sexual climax    Subjective Javier King presents for f/u anxiety/depression.  Recently started on Cymbalta-worked well. Has failed Zoloft, Prozac and now having issue with sexual side effects of Cymbalta. No thoughts of harming self or others. No self-medication with alcohol, prescription drugs or illicit drugs. He is not following with a counselor or psychologist.  Low back pain Strained back, given Flexeril that was helpful. Would like a refill. Does not do a whole lot of stretching/exercises at home. Has had PT in the past and did not do well with it. No numbness, tingling, weakness, or loss of bowel/bladder control.  Hypertension Patient presents for hypertension follow up. He does not monitor home blood pressures. He is compliant with medications- Lisinopril 40 mg daily. Patient has these side effects of medication: none He specifically denies headache, visual changes, chest pain, palpitations, dyspnea, orthopnea, PND or peripheral edema. He is adhering to a low sodium and low fat diet. He exercises rarely.  ROS Psych: No homicidal or suicidal thoughts Chest: No chest pain Lungs: No SOB  Past Medical History:  Diagnosis Date  . 10 year risk of MI or stroke 7.5% or greater 01/20/2016  . 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 DDD (degenerative disc disease), lumbar   . Depression 01/14/2016  . Erectile dysfunction   . Essential hypertension 01/14/2016  . History of TIA (transient ischemic attack)   . Hyperlipidemia   . Tubular adenoma of colon    02/17/10   Family History  Problem Relation Age of Onset  . Heart disease Mother   . Diabetes Sister      Medication List       Accurate as of 03/30/16  4:41 PM. Always use your most recent med list.          buPROPion 100 MG  tablet Commonly known as:  WELLBUTRIN Take 1 tablet (100 mg total) by mouth 2 (two) times daily.   cyclobenzaprine 5 MG tablet Commonly known as:  FLEXERIL Take 1 tablet (5 mg total) by mouth 3 (three) times daily as needed for muscle spasms. 1-2 tablets by mouth every 8 hours as needed.   lisinopril 40 MG tablet Commonly known as:  PRINIVIL,ZESTRIL Take 1 tablet (40 mg total) by mouth daily.   venlafaxine XR 37.5 MG 24 hr capsule Commonly known as:  EFFEXOR-XR Take 1 capsule (37.5 mg total) by mouth daily with breakfast.   zolpidem 5 MG tablet Commonly known as:  AMBIEN Take 1 tablet (5 mg total) by mouth at bedtime as needed for sleep.       Exam BP (!) 152/84 (BP Location: Left Arm, Patient Position: Sitting, Cuff Size: Large)   Pulse 78   Temp 98.2 F (36.8 C) (Oral)   Ht 6\' 1"  (1.854 m)   Wt 267 lb 6.4 oz (121.3 kg)   SpO2 98%   BMI 35.28 kg/m  General:  well developed, well nourished, in no apparent distress Eyes: PERRLA, EOMi, MMM Neck: neck supple without adenopathy, thyromegaly, or masses Lungs:  clear to auscultation, breath sounds equal bilaterally, no respiratory distress Cardio:  regular rate and rhythm without murmurs, heart sounds without clicks or rubs, no LE edema, no bruits Neuro: No cerebellar signs, patellar reflex 2/4 b/l, calcaneal reflex 1/4 b/l, no clonus MSK: Mild TTP  and hypertonicity over lumbar paraspinal musculature; neg straight leg and lesegue b/l; +Poor ROM of hamstrings b/l Psych: well oriented with normal range of affect and age-appropriate judgement/insight, alert and oriented x4.  Assessment and Plan  Anxiety associated with depression - Plan: venlafaxine XR (EFFEXOR-XR) 37.5 MG 24 hr capsule  Acute bilateral low back pain without sciatica - Plan: cyclobenzaprine (FLEXERIL) 5 MG tablet  Essential hypertension  Orders as above. Will change Cymbalta to Effexor. If still having issues with side effects, will try Trazodone and  consider increasing Wellbutrin. Number for counseling also provided again.  Home stretches and given for back. Offered PT, but due to his bad experiences, will hold off for now. I do not want him on chronic muscle relaxants. F/u in 1 mo, 2 weeks to recheck BP. The patient voiced understanding and agreement to the plan.  [place], DO 03/30/16 4:41 PM

## 2016-04-05 ENCOUNTER — Encounter: Payer: Self-pay | Admitting: Internal Medicine

## 2016-04-05 NOTE — Telephone Encounter (Signed)
Dr. Henrene Pastor reviewed records and has accepted patient. Ok to schedule Direct colon. Colonoscopy scheduled.

## 2016-04-20 ENCOUNTER — Ambulatory Visit (INDEPENDENT_AMBULATORY_CARE_PROVIDER_SITE_OTHER): Payer: Managed Care, Other (non HMO) | Admitting: Family Medicine

## 2016-04-20 VITALS — BP 138/78 | HR 85

## 2016-04-20 DIAGNOSIS — I10 Essential (primary) hypertension: Secondary | ICD-10-CM

## 2016-04-20 NOTE — Progress Notes (Signed)
Pre visit review using our clinic review tool, if applicable. No additional management support is needed unless otherwise documented below in the visit note.   Per 03/30/16 AVS: Return in about 4 weeks (around 04/27/2016) for Recheck anxiety; 2 weeks for BP check nurse visit.     Per Dr.Wendling: Continue current medication regimen. Follow up as            scheduled 05/02/16.

## 2016-04-20 NOTE — Patient Instructions (Signed)
Per Dr.Wendling: Continue current medication regimen. Follow up as scheduled 05/02/16.

## 2016-04-20 NOTE — Progress Notes (Signed)
Before implementing new blood pressure guidelines, I will await a statement from the [employer]. As of now he is at goal. I will see him at his follow-up visit.

## 2016-05-03 ENCOUNTER — Ambulatory Visit (INDEPENDENT_AMBULATORY_CARE_PROVIDER_SITE_OTHER): Payer: Managed Care, Other (non HMO) | Admitting: Family Medicine

## 2016-05-03 ENCOUNTER — Encounter: Payer: Self-pay | Admitting: Family Medicine

## 2016-05-03 VITALS — BP 130/80 | HR 80 | Temp 97.7°F | Ht 73.0 in | Wt 269.8 lb

## 2016-05-03 DIAGNOSIS — F418 Other specified anxiety disorders: Secondary | ICD-10-CM | POA: Diagnosis not present

## 2016-05-03 DIAGNOSIS — Z23 Encounter for immunization: Secondary | ICD-10-CM | POA: Diagnosis not present

## 2016-05-03 MED ORDER — DULOXETINE HCL 60 MG PO CPEP: 60.0000 mg | ORAL_CAPSULE | Freq: Every day | ORAL | 1 refills | Status: DC

## 2016-05-03 NOTE — Patient Instructions (Signed)
Think about cognitive behavioral therapy over the next month. If the medicine is not as effective as we hope, this would be a great adjunctive option.   If you are interested: Contact [phone] to schedule an appointment or inquire about cost/insurance coverage. Request "cognitive behavioral therapy for anxiety symptoms" if you choose to do this.

## 2016-05-03 NOTE — Progress Notes (Signed)
Chief Complaint  Patient presents with  . Follow-up    4 weeks-on anxiety-pt states he don't feel the new med helped much.    Subjective Javier King presents for f/u anxiety/depression.   Recently changed from Zoloft to Effexor 37.5 mg. Does not follow with psychiatrist or counselor. Has failed Zoloft and Prozac due to sexual side effects. No thoughts of harming self or others. No self-medication with alcohol, prescription drugs or illicit drugs.  ROS Psych: No homicidal or suicidal thoughts  Past Medical History:  Diagnosis Date  . 10 year risk of MI or stroke 7.5% or greater 01/20/2016  . 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 DDD (degenerative disc disease), lumbar   . Depression 01/14/2016  . Erectile dysfunction   . Essential hypertension 01/14/2016  . History of TIA (transient ischemic attack)   . Hyperlipidemia   . Tubular adenoma of colon    02/17/10   Family History  Problem Relation Age of Onset  . Heart disease Mother   . Diabetes Sister      Medication List       Accurate as of 05/03/16  4:27 PM. Always use your most recent med list.          buPROPion 100 MG tablet Commonly known as:  WELLBUTRIN Take 1 tablet (100 mg total) by mouth 2 (two) times daily.   cyclobenzaprine 5 MG tablet Commonly known as:  FLEXERIL Take 1 tablet (5 mg total) by mouth 3 (three) times daily as needed for muscle spasms. 1-2 tablets by mouth every 8 hours as needed.   DULoxetine 60 MG capsule Commonly known as:  CYMBALTA Take 1 capsule (60 mg total) by mouth daily.   lisinopril 40 MG tablet Commonly known as:  PRINIVIL,ZESTRIL Take 1 tablet (40 mg total) by mouth daily.   zolpidem 5 MG tablet Commonly known as:  AMBIEN Take 1 tablet (5 mg total) by mouth at bedtime as needed for sleep.       Exam BP 130/80 (BP Location: Left Arm, Patient Position: Sitting, Cuff Size: Large)   Pulse 80   Temp 97.7 F (36.5 C) (Oral)   Ht 6\' 1"  (1.854 m)   Wt 269  lb 12.8 oz (122.4 kg)   SpO2 99%   BMI 35.60 kg/m  General:  well developed, well nourished, in no apparent distress Neck: neck supple without adenopathy, thyromegaly, or masses Lungs:  clear to auscultation, breath sounds equal bilaterally, no respiratory distress Cardio:  regular rate and rhythm without murmurs, heart sounds without clicks or rubs, no LE edema Psych: well oriented with normal range of affect and age-appropriate judgement/insight, alert and oriented x4.  Assessment and Plan  Anxiety associated with depression - Plan: DULoxetine (CYMBALTA) 60 MG capsule  Orders as above. Stop Effexor. F/u in 1 mo. If no improvement, will push CBT harder and will try Trintellix.  The patient voiced understanding and agreement to the plan.  [place], DO 05/03/16 4:27 PM

## 2016-05-25 ENCOUNTER — Ambulatory Visit (AMBULATORY_SURGERY_CENTER): Payer: Self-pay

## 2016-05-25 VITALS — Ht 73.0 in | Wt 272.0 lb

## 2016-05-25 DIAGNOSIS — Z86010 Personal history of colonic polyps: Secondary | ICD-10-CM

## 2016-05-25 MED ORDER — NA SULFATE-K SULFATE-MG SULF 17.5-3.13-1.6 GM/177ML PO SOLN: ORAL | 0 refills | Status: DC

## 2016-05-25 NOTE — Progress Notes (Signed)
Per pt, no allergies to soy or egg products.Pt not taking any weight loss meds or using  O2 at home. 

## 2016-06-05 ENCOUNTER — Ambulatory Visit (AMBULATORY_SURGERY_CENTER): Payer: Managed Care, Other (non HMO) | Admitting: Internal Medicine

## 2016-06-05 ENCOUNTER — Encounter: Payer: Self-pay | Admitting: Internal Medicine

## 2016-06-05 VITALS — BP 156/84 | HR 78 | Temp 97.1°F | Resp 19 | Ht 73.0 in | Wt 272.0 lb

## 2016-06-05 DIAGNOSIS — Z86010 Personal history of colonic polyps: Secondary | ICD-10-CM | POA: Diagnosis not present

## 2016-06-05 DIAGNOSIS — D120 Benign neoplasm of cecum: Secondary | ICD-10-CM | POA: Diagnosis not present

## 2016-06-05 MED ORDER — SODIUM CHLORIDE 0.9 % IV SOLN: 500.0000 mL | INTRAVENOUS | Status: DC

## 2016-06-05 NOTE — Op Note (Signed)
[facility] Patient Name: Javier King Procedure Date: 06/05/2016 3:13 PM MRN: [id number] Endoscopist: Docia Chuck. Henrene Pastor , MD Age: 61 Referring MD:  Date of Birth: 1955/07/07 Gender: Male Account #: [id number] Procedure:                Colonoscopy, with hot snare polypectomy x 1 Indications:              Surveillance: Personal history of adenomatous                            polyps on last colonoscopy > 5 years ago, High risk                            colon cancer surveillance: Personal history of                            non-advanced adenoma. Index examination September                            2011 in [place]:                Monitored Anesthesia Care Procedure:                Pre-Anesthesia Assessment:                           - Prior to the procedure, a History and Physical                            was performed, and patient medications and                            allergies were reviewed. The patient's tolerance of                            previous anesthesia was also reviewed. The risks                            and benefits of the procedure and the sedation                            options and risks were discussed with the patient.                            All questions were answered, and informed consent                            was obtained. Prior Anticoagulants: The patient has                            taken no previous anticoagulant or antiplatelet                            agents. ASA Grade Assessment: II - A patient with  mild systemic disease. After reviewing the risks                            and benefits, the patient was deemed in                            satisfactory condition to undergo the procedure.                           After obtaining informed consent, the colonoscope                            was passed under direct vision. Throughout the                            procedure, the patient's  blood pressure, pulse, and                            oxygen saturations were monitored continuously. The                            Model CF-HQ190L [id number]) scope was introduced                            through the anus and advanced to the the cecum,                            identified by appendiceal orifice and ileocecal                            valve. The ileocecal valve, appendiceal orifice,                            and rectum were photographed. The quality of the                            bowel preparation was good. The colonoscopy was                            performed without difficulty. The patient tolerated                            the procedure well. The bowel preparation used was                            Suprep. Scope In: 3:22:44 PM Scope Out: 3:41:40 PM Scope Withdrawal Time: 0 hours 17 minutes 1 second  Total Procedure Duration: 0 hours 18 minutes 56 seconds  Findings:                 A 12 mm polyp was found in the ileocecal valve. The                            polyp was sessile. The polyp was removed with a  hot                            snare. Resection and retrieval were complete.                           A few diverticula were found in the left colon.                           Internal hemorrhoids were found during retroflexion.                           The exam was otherwise without abnormality on                            direct and retroflexion views. Complications:            No immediate complications. Estimated blood loss:                            None. Estimated Blood Loss:     Estimated blood loss: none. Impression:               - One 12 mm polyp at the ileocecal valve, removed                            with a hot snare. Resected and retrieved.                           - Diverticulosis in the left colon.                           - Internal hemorrhoids.                           - The examination was otherwise normal on direct                             and retroflexion views. Recommendation:           - Repeat colonoscopy in 3 years for surveillance.                           - Patient has a contact number available for                            emergencies. The signs and symptoms of potential                            delayed complications were discussed with the                            patient. Return to normal activities tomorrow.                            Written discharge instructions were provided to the  patient.                           - Resume previous diet.                           - Continue present medications.                           - Await pathology results. Docia Chuck. Henrene Pastor, MD 06/05/2016 3:52:04 PM This report has been signed electronically.

## 2016-06-05 NOTE — Progress Notes (Signed)
Called to room to assist during endoscopic procedure.  Patient ID and intended procedure confirmed with present staff. Received instructions for my participation in the procedure from the performing physician.  

## 2016-06-05 NOTE — Patient Instructions (Signed)
Impression/Recommendations:  Polyp handout given to patient. Diverticulosis handout given to patient. Hemorrhoid handout given to patient.  Repeat colonoscopy in 3 years for surveillance.  YOU HAD AN ENDOSCOPIC PROCEDURE TODAY AT THE [facility]:   Refer to the procedure report that was given to you for any specific questions about what was found during the examination.  If the procedure report does not answer your questions, please call your gastroenterologist to clarify.  If you requested that your care partner not be given the details of your procedure findings, then the procedure report has been included in a sealed envelope for you to review at your convenience later.  YOU SHOULD EXPECT: Some feelings of bloating in the abdomen. Passage of more gas than usual.  Walking can help get rid of the air that was put into your GI tract during the procedure and reduce the bloating. If you had a lower endoscopy (such as a colonoscopy or flexible sigmoidoscopy) you may notice spotting of blood in your stool or on the toilet paper. If you underwent a bowel prep for your procedure, you may not have a normal bowel movement for a few days.  Please Note:  You might notice some irritation and congestion in your nose or some drainage.  This is from the oxygen used during your procedure.  There is no need for concern and it should clear up in a day or so.  SYMPTOMS TO REPORT IMMEDIATELY:   Following lower endoscopy (colonoscopy or flexible sigmoidoscopy):  Excessive amounts of blood in the stool  Significant tenderness or worsening of abdominal pains  Swelling of the abdomen that is new, acute  Fever of 100F or higher For urgent or emergent issues, a gastroenterologist can be reached at any hour by calling [phone].   DIET:  We do recommend a small meal at first, but then you may proceed to your regular diet.  Drink plenty of fluids but you should avoid alcoholic beverages for 24  hours.  ACTIVITY:  You should plan to take it easy for the rest of today and you should NOT DRIVE or use heavy machinery until tomorrow (because of the sedation medicines used during the test).    FOLLOW UP: Our staff will call the number listed on your records the next business day following your procedure to check on you and address any questions or concerns that you may have regarding the information given to you following your procedure. If we do not reach you, we will leave a message.  However, if you are feeling well and you are not experiencing any problems, there is no need to return our call.  We will assume that you have returned to your regular daily activities without incident.  If any biopsies were taken you will be contacted by phone or by letter within the next 1-3 weeks.  Please call us at [phone] if you have not heard about the biopsies in 3 weeks.    SIGNATURES/CONFIDENTIALITY: You and/or your care partner have signed paperwork which will be entered into your electronic medical record.  These signatures attest to the fact that that the information above on your After Visit Summary has been reviewed and is understood.  Full responsibility of the confidentiality of this discharge information lies with you and/or your care-partner. 

## 2016-06-05 NOTE — Progress Notes (Signed)
To recovery, report to [place], RN, VSS 

## 2016-06-06 ENCOUNTER — Telehealth: Payer: Self-pay

## 2016-06-06 NOTE — Telephone Encounter (Signed)
  Follow up Call-  Call back number 06/05/2016  Post procedure Call Back phone  # [phone]  Permission to leave phone message Yes  Some recent data might be hidden     Patient questions:  Do you have a fever, pain , or abdominal swelling? No. Pain Score  0 *  Have you tolerated food without any problems? Yes.    Have you been able to return to your normal activities? Yes.    Do you have any questions about your discharge instructions: Diet   No. Medications  No. Follow up visit  No.  Do you have questions or concerns about your Care? No.  Actions: * If pain score is 4 or above: No action needed, pain <4.

## 2016-06-07 ENCOUNTER — Ambulatory Visit: Payer: Managed Care, Other (non HMO) | Admitting: Family Medicine

## 2016-06-12 ENCOUNTER — Encounter: Payer: Self-pay | Admitting: Internal Medicine

## 2016-06-23 ENCOUNTER — Ambulatory Visit (INDEPENDENT_AMBULATORY_CARE_PROVIDER_SITE_OTHER): Payer: Managed Care, Other (non HMO) | Admitting: Family Medicine

## 2016-06-23 ENCOUNTER — Encounter: Payer: Self-pay | Admitting: Family Medicine

## 2016-06-23 VITALS — BP 140/80 | HR 86 | Temp 97.7°F | Ht 73.0 in | Wt 272.4 lb

## 2016-06-23 DIAGNOSIS — F418 Other specified anxiety disorders: Secondary | ICD-10-CM | POA: Diagnosis not present

## 2016-06-23 DIAGNOSIS — G4700 Insomnia, unspecified: Secondary | ICD-10-CM | POA: Diagnosis not present

## 2016-06-23 MED ORDER — TRAZODONE HCL 100 MG PO TABS: 100.0000 mg | ORAL_TABLET | Freq: Every day | ORAL | 1 refills | Status: DC

## 2016-06-23 MED ORDER — ZOLPIDEM TARTRATE 5 MG PO TABS: 5.0000 mg | ORAL_TABLET | Freq: Every evening | ORAL | 5 refills | Status: DC | PRN

## 2016-06-23 NOTE — Progress Notes (Signed)
Pre visit review using our clinic review tool, if applicable. No additional management support is needed unless otherwise documented below in the visit note. 

## 2016-06-23 NOTE — Progress Notes (Signed)
Chief Complaint  Patient presents with  . Follow-up    1 month on meds    Subjective Javier King presents for f/u anxiety/depression.  Was changed from Effexor to Cymbalta at his last visit. Noticed some improvement, but still was not as much as the Zoloft. He is still having issues with ED. He is not following w a Social worker. Has failed Zoloft, Prozac, Effexor, Cymbalta. No thoughts of harming self or others. No self-medication with alcohol, prescription drugs or illicit drugs.  ROS Psych: No homicidal or suicidal thoughts  Past Medical History:  Diagnosis Date  . 10 year risk of MI or stroke 7.5% or greater 01/20/2016  . Anxiety   . 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 Cancer ([place]) [id number]   skin cancer  . DDD (degenerative disc disease), lumbar   . Depression 01/14/2016  . Erectile dysfunction   . Essential hypertension 01/14/2016  . History of TIA (transient ischemic attack)    per pt/ unsure if had TIA   . Hyperlipidemia   . Pericarditis 1986  . Tubular adenoma of colon    02/17/10   Family History  Problem Relation Age of Onset  . Heart disease Mother   . Diabetes Sister   . Alzheimer's disease Father   . Parkinsonism Father   . Leukemia Sister    Allergies as of 06/23/2016      Reactions   Penicillins Itching      Medication List       Accurate as of 06/23/16  4:31 PM. Always use your most recent med list.          buPROPion 100 MG tablet Commonly known as:  WELLBUTRIN Take 1 tablet (100 mg total) by mouth 2 (two) times daily.   cyclobenzaprine 5 MG tablet Commonly known as:  FLEXERIL Take 1 tablet (5 mg total) by mouth 3 (three) times daily as needed for muscle spasms. 1-2 tablets by mouth every 8 hours as needed.   DULoxetine 60 MG capsule Commonly known as:  CYMBALTA Take 1 capsule (60 mg total) by mouth daily.   lisinopril 40 MG tablet Commonly known as:  PRINIVIL,ZESTRIL Take 1 tablet (40 mg total) by mouth daily.    traZODone 100 MG tablet Commonly known as:  DESYREL Take 1 tablet (100 mg total) by mouth at bedtime.   zolpidem 5 MG tablet Commonly known as:  AMBIEN Take 1 tablet (5 mg total) by mouth at bedtime as needed for sleep.       Exam BP 140/80 (BP Location: Left Arm, Patient Position: Sitting, Cuff Size: Large)   Pulse 86   Temp 97.7 F (36.5 C) (Oral)   Ht 6\' 1"  (1.854 m)   Wt 272 lb 6.4 oz (123.6 kg)   SpO2 98%   BMI 35.94 kg/m  General:  well developed, well nourished, in no apparent distress Neck: neck supple without adenopathy, thyromegaly, or masses Lungs:  clear to auscultation, breath sounds equal bilaterally, no respiratory distress Cardio:  regular rate and rhythm without murmurs, heart sounds without clicks or rubs Psych: well oriented with normal range of affect and age-appropriate judgement/insight, alert and oriented x4.  Assessment and Plan  Anxiety associated with depression - Plan: zolpidem (AMBIEN) 5 MG tablet, traZODone (DESYREL) 100 MG tablet  Insomnia, unspecified type - Plan: zolpidem (AMBIEN) 5 MG tablet, traZODone (DESYREL) 100 MG tablet  Orders as above. Start Trazodone. May add Buspar if he is feeling well and not having adverse effects. F/u in 6  weeks- will also recheck BP. The patient voiced understanding and agreement to the plan.  [place], DO 06/23/16 4:31 PM

## 2016-08-03 ENCOUNTER — Ambulatory Visit: Payer: Managed Care, Other (non HMO) | Admitting: Family Medicine

## 2016-08-12 ENCOUNTER — Other Ambulatory Visit: Payer: Self-pay | Admitting: Family Medicine

## 2016-08-12 DIAGNOSIS — F418 Other specified anxiety disorders: Secondary | ICD-10-CM

## 2016-08-12 DIAGNOSIS — G4700 Insomnia, unspecified: Secondary | ICD-10-CM

## 2016-08-14 ENCOUNTER — Other Ambulatory Visit: Payer: Self-pay | Admitting: Family Medicine

## 2016-08-14 DIAGNOSIS — G4700 Insomnia, unspecified: Secondary | ICD-10-CM

## 2016-08-14 DIAGNOSIS — F418 Other specified anxiety disorders: Secondary | ICD-10-CM

## 2016-08-14 NOTE — Telephone Encounter (Signed)
Called and [facility] @ 7:22pm @ [phone]) asking the pt to RTC regarding refill request.//AB/CMA

## 2016-08-16 NOTE — Telephone Encounter (Signed)
Rx was filled on (08/16/16).//AB/CMA

## 2016-08-16 NOTE — Telephone Encounter (Signed)
Rx sent to the pharmacy by e-script.//AB/CMA 

## 2016-08-17 ENCOUNTER — Encounter: Payer: Self-pay | Admitting: Family Medicine

## 2016-08-17 ENCOUNTER — Ambulatory Visit (INDEPENDENT_AMBULATORY_CARE_PROVIDER_SITE_OTHER): Payer: Managed Care, Other (non HMO) | Admitting: Family Medicine

## 2016-08-17 VITALS — BP 160/80 | HR 81 | Temp 97.7°F | Ht 73.0 in | Wt 275.6 lb

## 2016-08-17 DIAGNOSIS — I10 Essential (primary) hypertension: Secondary | ICD-10-CM

## 2016-08-17 DIAGNOSIS — F418 Other specified anxiety disorders: Secondary | ICD-10-CM

## 2016-08-17 MED ORDER — CHLORTHALIDONE 25 MG PO TABS: 25.0000 mg | ORAL_TABLET | Freq: Every day | ORAL | 2 refills | Status: DC

## 2016-08-17 NOTE — Patient Instructions (Signed)
Around 3 times per week, check your blood pressure 4 times per day. Twice in the morning and twice in the evening. The readings should be at least one minute apart. Write down these values and bring them to your next nurse visit/appointment.  When you check your BP, make sure you have been doing something calm/relaxing 5 minutes prior to checking. Both feet should be flat on the floor and you should be sitting. Use your left arm and make sure it is in a relaxed position (on a table), and that the cuff is at the approximate level/height of your heart.

## 2016-08-17 NOTE — Progress Notes (Signed)
Pre visit review using our clinic review tool, if applicable. No additional management support is needed unless otherwise documented below in the visit note. 

## 2016-08-17 NOTE — Progress Notes (Signed)
Chief Complaint  Patient presents with  . Follow-up    on anxiety meds-pt states the meds are working    Subjective Javier King is a 61 y.o. male who presents for hypertension follow up. He does not monitor home blood pressures. He is compliant with medications- Lisinopril 40 mg daily. Patient has these side effects of medication: none He is not adhering to a healthy diet overall. Current exercise: starting to walk  Around 2 months ago, the patient was started on trazodone 100 mg nightly. He states that he is not having any side effects from this and feels it is working well. He is also sleeping better not taking Ambien every night. No self-medication. No homicidal or suicidal ideation.   Past Medical History:  Diagnosis Date  . 10 year risk of MI or stroke 7.5% or greater 01/20/2016  . Anxiety   . 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 Cancer ([place]) [id number]   skin cancer  . DDD (degenerative disc disease), lumbar   . Depression 01/14/2016  . Erectile dysfunction   . Essential hypertension 01/14/2016  . History of TIA (transient ischemic attack)    per pt/ unsure if had TIA   . Hyperlipidemia   . Pericarditis 1986  . Tubular adenoma of colon    02/17/10   Family History  Problem Relation Age of Onset  . Heart disease Mother   . Diabetes Sister   . Alzheimer's disease Father   . Parkinsonism Father   . Leukemia Sister      Medications Current Outpatient Prescriptions on File Prior to Visit  Medication Sig Dispense Refill  . buPROPion (WELLBUTRIN) 100 MG tablet Take 1 tablet (100 mg total) by mouth 2 (two) times daily. 180 tablet 3  . cyclobenzaprine (FLEXERIL) 5 MG tablet Take 1 tablet (5 mg total) by mouth 3 (three) times daily as needed for muscle spasms. 1-2 tablets by mouth every 8 hours as needed. 30 tablet 0  . lisinopril (PRINIVIL,ZESTRIL) 40 MG tablet Take 1 tablet (40 mg total) by mouth daily. 90 tablet 3  . traZODone (DESYREL) 100 MG tablet TAKE 1  TABLET(100 MG) BY MOUTH AT BEDTIME 30 tablet 1  . zolpidem (AMBIEN) 5 MG tablet Take 1 tablet (5 mg total) by mouth at bedtime as needed for sleep. 30 tablet 5   Allergies Allergies  Allergen Reactions  . Penicillins Itching    Review of Systems Cardiovascular: no chest pain Respiratory:  no shortness of breath  Exam BP (!) 160/80 (BP Location: Left Arm, Patient Position: Sitting, Cuff Size: Large)   Pulse 81   Temp 97.7 F (36.5 C) (Oral)   Ht 6\' 1"  (1.854 m)   Wt 275 lb 9.6 oz (125 kg)   SpO2 98%   BMI 36.36 kg/m  General:  well developed, well nourished, in no apparent distress Skin:  warm, no pallor or diaphoresis Eyes:  pupils equal and round, sclera anicteric without injection Heart :RRR, no murmurs, no bruits, no LE edema Lungs:  clear to auscultation, no accessory muscle use Psych: well oriented with normal range of affect and appropriate judgment/insight  Essential hypertension - Plan: chlorthalidone (HYGROTON) 25 MG tablet, Basic metabolic panel  Anxiety associated with depression  Orders as above.  Counseled on diet and exercise. We have finally made some improvement with his medication regimen for anxiety. I will not change anything. F/u in 6 weeks to recheck BP. Instructed him to keep home blood pressures. Follow-up in one week for labs. The  patient voiced understanding and agreement to the plan.  [place], DO 08/17/16  4:38 PM

## 2016-08-24 ENCOUNTER — Other Ambulatory Visit (INDEPENDENT_AMBULATORY_CARE_PROVIDER_SITE_OTHER): Payer: Managed Care, Other (non HMO)

## 2016-08-24 DIAGNOSIS — I10 Essential (primary) hypertension: Secondary | ICD-10-CM | POA: Diagnosis not present

## 2016-08-24 LAB — BASIC METABOLIC PANEL
BUN: 14 mg/dL (ref 6–23)
CALCIUM: 9.8 mg/dL (ref 8.4–10.5)
CO2: 29 meq/L (ref 19–32)
CREATININE: 1.31 mg/dL (ref 0.40–1.50)
Chloride: 99 mEq/L (ref 96–112)
GFR: 59.62 mL/min — ABNORMAL LOW (ref >60.00)
Glucose, Bld: 101 mg/dL — ABNORMAL HIGH (ref 70–99)
Potassium: 3.7 mEq/L (ref 3.5–5.1)
Sodium: 135 mEq/L (ref 135–145)

## 2016-09-08 ENCOUNTER — Telehealth: Payer: Self-pay | Admitting: Family Medicine

## 2016-09-08 DIAGNOSIS — M545 Low back pain, unspecified: Secondary | ICD-10-CM

## 2016-09-08 MED ORDER — CYCLOBENZAPRINE HCL 5 MG PO TABS: 5.0000 mg | ORAL_TABLET | Freq: Three times a day (TID) | ORAL | 3 refills | Status: DC | PRN

## 2016-09-08 MED ORDER — METHYLPREDNISOLONE 4 MG PO TBPK: ORAL_TABLET | ORAL | 0 refills | Status: DC

## 2016-09-08 NOTE — Telephone Encounter (Signed)
Done

## 2016-09-08 NOTE — Telephone Encounter (Signed)
°  Relation to EV:QWQV Call back number:[phone] Pharmacy: [id number] - [place], [place] - [address] [phone] (Phone) [phone] (Fax)    Reason for call:  Patient requesting Methylprednisolone pill form (patient states Rx has been prescribed before, chart doesn't reflect) for he's ongoing back pain and cyclobenzaprine (FLEXERIL) 5 MG tablet , please advise

## 2016-09-28 ENCOUNTER — Encounter: Payer: Self-pay | Admitting: Family Medicine

## 2016-09-28 ENCOUNTER — Ambulatory Visit (INDEPENDENT_AMBULATORY_CARE_PROVIDER_SITE_OTHER): Payer: Managed Care, Other (non HMO) | Admitting: Family Medicine

## 2016-09-28 VITALS — BP 130/78 | HR 80 | Temp 97.9°F | Ht 73.0 in | Wt 272.2 lb

## 2016-09-28 DIAGNOSIS — I10 Essential (primary) hypertension: Secondary | ICD-10-CM | POA: Diagnosis not present

## 2016-09-28 NOTE — Patient Instructions (Addendum)
Healthy Eating Plan Many factors influence your heart health, including eating and exercise habits. Heart (coronary) risk increases with abnormal blood fat (lipid) levels. Heart-healthy meal planning includes limiting unhealthy fats, increasing healthy fats, and making other small dietary changes. This includes maintaining a healthy body weight to help keep lipid levels within a normal range.  WHAT IS MY PLAN?  Your health care provider recommends that you:  Drink a glass of water before meals to help with satiety.  Eat slowly.  An alternative to the water is to add Metamucil. This will help with satiety as well. It does contain calories, unlike water.  WHAT TYPES OF FAT SHOULD I CHOOSE?  Choose healthy fats more often. Choose monounsaturated and polyunsaturated fats, such as olive oil and canola oil, flaxseeds, walnuts, almonds, and seeds.  Eat more omega-3 fats. Good choices include salmon, mackerel, sardines, tuna, flaxseed oil, and ground flaxseeds. Aim to eat fish at least two times each week.  Avoid foods with partially hydrogenated oils in them. These contain trans fats. Examples of foods that contain trans fats are stick margarine, some tub margarines, cookies, crackers, and other baked goods. If you are going to avoid a fat, this is the one to avoid!  WHAT GENERAL GUIDELINES DO I NEED TO FOLLOW?  Check food labels carefully to identify foods with trans fats. Avoid these types of options when possible.  Fill one half of your plate with vegetables and green salads. Eat 4-5 servings of vegetables per day. A serving of vegetables equals 1 cup of raw leafy vegetables,  cup of raw or cooked cut-up vegetables, or  cup of vegetable juice.  Fill one fourth of your plate with whole grains. Look for the word "whole" as the first word in the ingredient list.  Fill one fourth of your plate with lean protein foods.  Eat 4-5 servings of fruit per day. A serving of fruit equals one medium  whole fruit,  cup of dried fruit,  cup of fresh, frozen, or canned fruit. Try to avoid fruits in cups/syrups as the sugar content can be high.  Eat more foods that contain soluble fiber. Examples of foods that contain this type of fiber are apples, broccoli, carrots, beans, peas, and barley. Aim to get 20-30 g of fiber per day.  Eat more home-cooked food and less restaurant, buffet, and fast food.  Limit or avoid alcohol.  Limit foods that are high in starch and sugar.  Avoid fried foods when able.  Cook foods by using methods other than frying. Baking, boiling, grilling, and broiling are all great options. Other fat-reducing suggestions include: ? Removing the skin from poultry. ? Removing all visible fats from meats. ? Skimming the fat off of stews, soups, and gravies before serving them. ? Steaming vegetables in water or broth.  Lose weight if you are overweight. Losing just 5-10% of your initial body weight can help your overall health and prevent diseases such as diabetes and heart disease.  Increase your consumption of nuts, legumes, and seeds to 4-5 servings per week. One serving of dried beans or legumes equals  cup after being cooked, one serving of nuts equals 1 ounces, and one serving of seeds equals  ounce or 1 tablespoon.  WHAT ARE GOOD FOODS CAN I EAT? Grains Grainy breads (try to find bread that is 3 g of fiber per slice or greater), oatmeal, light popcorn. Whole-grain cereals. Rice and pasta, including brown rice and those that are made with whole wheat.   Edamame pasta is a great alternative to grain pasta. It has a higher protein content. Try to avoid significant consumption of white bread, sugary cereals, or pastries/baked goods.  Vegetables All vegetables. Cooked white potatoes do not count as vegetables.  Fruits All fruits, but limit pineapple and bananas as these fruits have a higher sugar content.  Meats and Other Protein Sources Lean, well-trimmed beef,  veal, pork, and lamb. Chicken and [place] without skin. All fish and shellfish. Wild duck, rabbit, pheasant, and venison. Egg whites or low-cholesterol egg substitutes. Dried beans, peas, lentils, and tofu.Seeds and most nuts.  Dairy Low-fat or nonfat cheeses, including ricotta, string, and mozzarella. Skim or 1% milk that is liquid, powdered, or evaporated. Buttermilk that is made with low-fat milk. Nonfat or low-fat yogurt. Soy/Almond milk are good alternatives if you cannot handle dairy.  Beverages Water is the best for you. Sports drinks with less sugar are more desirable unless you are a highly active athlete.  Sweets and Desserts Sherbets and fruit ices. Honey, jam, marmalade, jelly, and syrups. Dark chocolate.  Eat all sweets and desserts in moderation.  Fats and Oils Nonhydrogenated (trans-free) margarines. Vegetable oils, including soybean, sesame, sunflower, olive, peanut, safflower, corn, canola, and cottonseed. Salad dressings or mayonnaise that are made with a vegetable oil. Limit added fats and oils that you use for cooking, baking, salads, and as spreads.  Other Cocoa powder. Coffee and tea. Most condiments.  The items listed above may not be a complete list of recommended foods or beverages. Contact your dietitian for more options.  OK to check BP 1-2 times per week, 2 readings per day that you check, 1 minute apart. Alternate between checking AM and PM.

## 2016-09-28 NOTE — Progress Notes (Signed)
Chief Complaint  Patient presents with  . Follow-up    6 weeks for recheck on BP    Subjective Javier King is a 61 y.o. male who presents for hypertension follow up. He does monitor home blood pressures. Blood pressures ranging from 120-130's/70's on average. He is compliant with medications- lisinopril 40 mg daily, recently started on Chlorthalidone 25 mg daily. Patient has these side effects of medication: none He is adhering to a healthy diet overall. Subscribes to [employer]- is doing much better with portion control.  Current exercise: walking- used to be a runner but it is too hard on his back now.    Past Medical History:  Diagnosis Date  . 10 year risk of MI or stroke 7.5% or greater 01/20/2016  . Anxiety   . 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 Cancer ([place]) [id number]   skin cancer  . DDD (degenerative disc disease), lumbar   . Depression 01/14/2016  . Erectile dysfunction   . Essential hypertension 01/14/2016  . History of TIA (transient ischemic attack)    per pt/ unsure if had TIA   . Hyperlipidemia   . Pericarditis 1986  . Tubular adenoma of colon    02/17/10   Family History  Problem Relation Age of Onset  . Heart disease Mother   . Diabetes Sister   . Alzheimer's disease Father   . Parkinsonism Father   . Leukemia Sister      Medications Current Outpatient Prescriptions on File Prior to Visit  Medication Sig Dispense Refill  . buPROPion (WELLBUTRIN) 100 MG tablet Take 1 tablet (100 mg total) by mouth 2 (two) times daily. 180 tablet 3  . chlorthalidone (HYGROTON) 25 MG tablet Take 1 tablet (25 mg total) by mouth daily. 30 tablet 2  . cyclobenzaprine (FLEXERIL) 5 MG tablet Take 1 tablet (5 mg total) by mouth 3 (three) times daily as needed for muscle spasms. 1-2 tablets by mouth every 8 hours as needed. 30 tablet 3  . lisinopril (PRINIVIL,ZESTRIL) 40 MG tablet Take 1 tablet (40 mg total) by mouth daily. 90 tablet 3  . traZODone (DESYREL) 100  MG tablet TAKE 1 TABLET(100 MG) BY MOUTH AT BEDTIME 30 tablet 1  . zolpidem (AMBIEN) 5 MG tablet Take 1 tablet (5 mg total) by mouth at bedtime as needed for sleep. 30 tablet 5   Allergies Allergies  Allergen Reactions  . Penicillins Itching    Review of Systems Cardiovascular: no chest pain Respiratory:  no shortness of breath  Exam BP 130/78 (BP Location: Left Arm, Patient Position: Sitting, Cuff Size: Large)   Pulse 80   Temp 97.9 F (36.6 C) (Oral)   Ht 6\' 1"  (1.854 m)   Wt 272 lb 3.2 oz (123.5 kg)   SpO2 98%   BMI 35.91 kg/m  General:  well developed, well nourished, in no apparent distress Skin:  warm, no pallor or diaphoresis Eyes:  pupils equal and round, sclera anicteric without injection Heart :RRR, no murmurs, no bruits, 2+ pitting LE edema b/l up to 1/2 tibia Lungs:  clear to auscultation, no accessory muscle use Psych: well oriented with normal range of affect and appropriate judgment/insight  Essential hypertension  Cont current regimen. Counseled on diet and exercise- Challenged him to go to the gym. It seems like there are some self-conscious issues. Cycling is another option. F/u in 3 mo, at earliest convenience for nurse visit to make sure his home BP monitor is accurate. OK to back off on BP  readings to 1-2 times per week, 2 readings per day, still 1 min apart. Of note, the patient noted that he uses Ambien around 1-2 times per mo now that the Trazodone is working well! Great news. The patient voiced understanding and agreement to the plan.  Greater than 15 minutes were spent face to face with the patient with greater than 50% of this time spent counseling on diet and exercise, treatment options and plan moving forward.    [place], DO 09/28/16  4:48 PM

## 2016-10-12 ENCOUNTER — Other Ambulatory Visit: Payer: Self-pay | Admitting: Family Medicine

## 2016-10-12 DIAGNOSIS — F418 Other specified anxiety disorders: Secondary | ICD-10-CM

## 2016-10-12 DIAGNOSIS — G4700 Insomnia, unspecified: Secondary | ICD-10-CM

## 2016-10-12 NOTE — Telephone Encounter (Signed)
Requesting Trazodone 100mg -Take 1 tablet by mouth as bedtime. Last refill:08/16/16;#30,1 Last OV:09/28/16 Please advise.//AB/CMA

## 2016-11-07 ENCOUNTER — Other Ambulatory Visit: Payer: Self-pay | Admitting: Medical

## 2016-11-07 DIAGNOSIS — G4700 Insomnia, unspecified: Secondary | ICD-10-CM

## 2016-11-07 DIAGNOSIS — F418 Other specified anxiety disorders: Secondary | ICD-10-CM

## 2016-12-14 ENCOUNTER — Other Ambulatory Visit: Payer: Self-pay | Admitting: Family Medicine

## 2016-12-14 DIAGNOSIS — G4700 Insomnia, unspecified: Secondary | ICD-10-CM

## 2016-12-14 DIAGNOSIS — F418 Other specified anxiety disorders: Secondary | ICD-10-CM

## 2016-12-15 NOTE — Telephone Encounter (Signed)
Requesting Trazodone 100mg -Take 1 tablet by mouth every night at bedtime. Last refill:11/07/16;#30,0 Last OV:09/28/16-Next:12/28/16 Please advise.//AB/CMA

## 2016-12-18 MED ORDER — TRAZODONE HCL 100 MG PO TABS: 100.0000 mg | ORAL_TABLET | Freq: Every day | ORAL | 1 refills | Status: DC

## 2016-12-28 ENCOUNTER — Encounter: Payer: Self-pay | Admitting: Family Medicine

## 2016-12-28 ENCOUNTER — Ambulatory Visit (INDEPENDENT_AMBULATORY_CARE_PROVIDER_SITE_OTHER): Payer: Managed Care, Other (non HMO) | Admitting: Family Medicine

## 2016-12-28 ENCOUNTER — Telehealth: Payer: Self-pay | Admitting: Family Medicine

## 2016-12-28 VITALS — BP 120/78 | HR 78 | Temp 97.6°F | Ht 73.0 in | Wt 256.0 lb

## 2016-12-28 DIAGNOSIS — I10 Essential (primary) hypertension: Secondary | ICD-10-CM | POA: Diagnosis not present

## 2016-12-28 DIAGNOSIS — G4700 Insomnia, unspecified: Secondary | ICD-10-CM | POA: Diagnosis not present

## 2016-12-28 DIAGNOSIS — F418 Other specified anxiety disorders: Secondary | ICD-10-CM | POA: Diagnosis not present

## 2016-12-28 MED ORDER — ZOLPIDEM TARTRATE 5 MG PO TABS: 5.0000 mg | ORAL_TABLET | Freq: Every evening | ORAL | 2 refills | Status: DC | PRN

## 2016-12-28 MED ORDER — CHLORTHALIDONE 25 MG PO TABS: 25.0000 mg | ORAL_TABLET | Freq: Every day | ORAL | 1 refills | Status: DC

## 2016-12-28 MED ORDER — BUPROPION HCL 100 MG PO TABS: 100.0000 mg | ORAL_TABLET | Freq: Two times a day (BID) | ORAL | 3 refills | Status: DC

## 2016-12-28 NOTE — Progress Notes (Addendum)
Chief Complaint  Patient presents with  . Follow-up    3 mos for recheck HTN and mood-pt states doing good    Subjective Javier King is a 61 y.o. male who presents for hypertension follow up. He does not routinely monitor home blood pressures. He is compliant with medications- chlorthalidone 25 mg daily and lisinopril 40 mg daily. Patient has these side effects of medication: none He is adhering to a healthy diet overall. He has lost around 16 pounds since last visit. Current exercise: walking and biking  He also presented for follow-up depression with anxiety. His mood is doing very well. He is compliant with his trazodone 100 mg daily. He is not having any side effects with this medicine. He is using Ambien around 1-2 times per month.    Past Medical History:  Diagnosis Date  . 10 year risk of MI or stroke 7.5% or greater 01/20/2016  . Anxiety   . 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 Cancer ([place]) [id number]   skin cancer  . DDD (degenerative disc disease), lumbar   . Depression 01/14/2016  . Erectile dysfunction   . Essential hypertension 01/14/2016  . History of TIA (transient ischemic attack)    per pt/ unsure if had TIA   . Hyperlipidemia   . Pericarditis 1986  . Tubular adenoma of colon    02/17/10   Family History  Problem Relation Age of Onset  . Heart disease Mother   . Diabetes Sister   . Alzheimer's disease Father   . Parkinsonism Father   . Leukemia Sister      Medications Current Outpatient Prescriptions on File Prior to Visit  Medication Sig Dispense Refill  . buPROPion (WELLBUTRIN) 100 MG tablet Take 1 tablet (100 mg total) by mouth 2 (two) times daily. 180 tablet 3  . chlorthalidone (HYGROTON) 25 MG tablet Take 1 tablet (25 mg total) by mouth daily. 30 tablet 2  . cyclobenzaprine (FLEXERIL) 5 MG tablet Take 1 tablet (5 mg total) by mouth 3 (three) times daily as needed for muscle spasms. 1-2 tablets by mouth every 8 hours as needed. 30  tablet 3  . lisinopril (PRINIVIL,ZESTRIL) 40 MG tablet Take 1 tablet (40 mg total) by mouth daily. 90 tablet 3  . traZODone (DESYREL) 100 MG tablet Take 1 tablet (100 mg total) by mouth at bedtime. 90 tablet 1  . zolpidem (AMBIEN) 5 MG tablet Take 1 tablet (5 mg total) by mouth at bedtime as needed for sleep. 30 tablet 5   Allergies Allergies  Allergen Reactions  . Penicillins Itching    Review of Systems Cardiovascular: no chest pain Respiratory:  no shortness of breath  Exam BP 120/78 (BP Location: Left Arm, Patient Position: Sitting, Cuff Size: Normal)   Pulse 78   Temp 97.6 F (36.4 C) (Oral)   Ht 6\' 1"  (1.854 m)   Wt 256 lb (116.1 kg)   SpO2 98%   BMI 33.78 kg/m  General:  well developed, well nourished, in no apparent distress Skin:  warm, no pallor or diaphoresis Eyes:  pupils equal and round, sclera anicteric without injection Heart :RRR, no bruits, 1+ pitting b/l LE edema up to distal 1/3 of tibia (improved from last time) Lungs:  clear to auscultation, no accessory muscle use Psych: well oriented with normal range of affect and appropriate judgment/insight  Essential hypertension - Plan: chlorthalidone (HYGROTON) 25 MG tablet  Anxiety associated with depression - Plan: zolpidem (AMBIEN) 5 MG tablet  Insomnia, unspecified type -  Plan: zolpidem (AMBIEN) 5 MG tablet  Orders as above. Recheck renal and potassium levels today. Counseled on diet and exercise- he is doing very well. As he continues to lose weight, I would like him to check his blood pressure intermittently, around once per week. If it continues go down, his will let us know and we will wean down on his medicine. We'll refill Ambien as he is using it on an as-needed basis very infrequently. Continue trazodone. F/u in 6 mo. The patient voiced understanding and agreement to the plan.  [place], DO 12/28/16  4:11 PM

## 2016-12-28 NOTE — Patient Instructions (Signed)
Keep up the great work!  Give [place] 2-3 business days to get the results of your labs back.   Let us know if you need anything.

## 2016-12-29 LAB — BASIC METABOLIC PANEL
BUN: 13 mg/dL (ref 6–23)
CHLORIDE: 96 meq/L (ref 96–112)
CO2: 29 mEq/L (ref 19–32)
Calcium: 9.6 mg/dL (ref 8.4–10.5)
Creatinine, Ser: 1.17 mg/dL (ref 0.40–1.50)
GFR: 67.84 mL/min (ref >60.00)
GLUCOSE: 89 mg/dL (ref 70–99)
Potassium: 4 mEq/L (ref 3.5–5.1)
Sodium: 132 mEq/L — ABNORMAL LOW (ref 135–145)

## 2017-01-08 ENCOUNTER — Other Ambulatory Visit: Payer: Self-pay | Admitting: Family Medicine

## 2017-01-08 DIAGNOSIS — E871 Hypo-osmolality and hyponatremia: Secondary | ICD-10-CM

## 2017-01-12 ENCOUNTER — Other Ambulatory Visit (INDEPENDENT_AMBULATORY_CARE_PROVIDER_SITE_OTHER): Payer: Managed Care, Other (non HMO)

## 2017-01-12 DIAGNOSIS — E871 Hypo-osmolality and hyponatremia: Secondary | ICD-10-CM

## 2017-01-12 NOTE — Addendum Note (Signed)
Addended by: Caffie Pinto on: 01/12/2017 03:46 PM   Modules accepted: Orders

## 2017-01-13 LAB — BASIC METABOLIC PANEL
BUN: 17 mg/dL (ref 7–25)
CALCIUM: 9.7 mg/dL (ref 8.6–10.3)
CO2: 27 mmol/L (ref 20–32)
CREATININE: 1.24 mg/dL (ref 0.70–1.33)
Chloride: 98 mmol/L (ref 98–110)
Glucose, Bld: 73 mg/dL (ref 65–99)
Potassium: 4.2 mmol/L (ref 3.5–5.3)
Sodium: 136 mmol/L (ref 135–146)

## 2017-01-25 ENCOUNTER — Other Ambulatory Visit: Payer: Self-pay | Admitting: Family Medicine

## 2017-01-25 MED ORDER — TADALAFIL 20 MG PO TABS: 10.0000 mg | ORAL_TABLET | ORAL | 1 refills | Status: DC | PRN

## 2017-01-25 MED ORDER — LISINOPRIL 40 MG PO TABS: 40.0000 mg | ORAL_TABLET | Freq: Every day | ORAL | 3 refills | Status: DC

## 2017-01-25 NOTE — Telephone Encounter (Signed)
Patient informed prescription sent in

## 2017-01-25 NOTE — Telephone Encounter (Signed)
Pt would like to know if provider could prescribe Cialis, he said that he use to take it in the past.   He would like to have it sent to the [facility] on file.

## 2017-01-25 NOTE — Telephone Encounter (Signed)
Done

## 2017-01-29 ENCOUNTER — Telehealth: Payer: Self-pay | Admitting: Family Medicine

## 2017-01-29 DIAGNOSIS — M545 Low back pain, unspecified: Secondary | ICD-10-CM

## 2017-01-29 NOTE — Telephone Encounter (Signed)
Self.   Pt is requesting for his back pain, pt would like to request medication Methylprednisolone. He said that it has worked for him in the past.    Pharmacy: [facility] [address]

## 2017-01-30 NOTE — Telephone Encounter (Signed)
Relation to TD:DUKG Call back number: Pharmacy:   Reason for call:  Checking on the status of Methylprednisolone and cyclobenzaprine (FLEXERIL) 5 MG tablet request, informed patient PCP is out of the office, please advise

## 2017-01-31 MED ORDER — CYCLOBENZAPRINE HCL 5 MG PO TABS: 5.0000 mg | ORAL_TABLET | Freq: Three times a day (TID) | ORAL | 3 refills | Status: DC | PRN

## 2017-01-31 NOTE — Telephone Encounter (Signed)
Please advise on refill for flexeril  Last filled 09/08/16 Qty:30 Rf:3  Last OV: 12/28/16

## 2017-01-31 NOTE — Telephone Encounter (Signed)
Refill of Flexeril sent in. TY.

## 2017-02-01 MED ORDER — MELOXICAM 15 MG PO TABS: 15.0000 mg | ORAL_TABLET | Freq: Every day | ORAL | 0 refills | Status: DC

## 2017-02-01 NOTE — Telephone Encounter (Signed)
Pt called in to follow up on refill request for Methylprednisolone    Pt would like to have today if possible. Pt says that it helped for his back pain.    Please advise.

## 2017-02-01 NOTE — Addendum Note (Signed)
Addended by: Ames Coupe on: 02/01/2017 01:11 PM   Modules accepted: Orders

## 2017-02-01 NOTE — Telephone Encounter (Signed)
I called in something else that I expect to work just as well, but would have fewer side effects. TY.

## 2017-02-01 NOTE — Telephone Encounter (Signed)
Patient informed of instructions on medication. He verbalized understanding.

## 2017-02-24 ENCOUNTER — Other Ambulatory Visit: Payer: Self-pay | Admitting: Family Medicine

## 2017-03-22 ENCOUNTER — Other Ambulatory Visit: Payer: Self-pay | Admitting: Family Medicine

## 2017-04-11 ENCOUNTER — Encounter: Payer: Self-pay | Admitting: Family Medicine

## 2017-04-11 ENCOUNTER — Ambulatory Visit: Payer: Managed Care, Other (non HMO) | Admitting: Family Medicine

## 2017-04-11 VITALS — BP 110/72 | HR 75 | Temp 97.6°F | Ht 73.0 in | Wt 260.2 lb

## 2017-04-11 DIAGNOSIS — I10 Essential (primary) hypertension: Secondary | ICD-10-CM | POA: Diagnosis not present

## 2017-04-11 DIAGNOSIS — M533 Sacrococcygeal disorders, not elsewhere classified: Secondary | ICD-10-CM

## 2017-04-11 DIAGNOSIS — F418 Other specified anxiety disorders: Secondary | ICD-10-CM | POA: Diagnosis not present

## 2017-04-11 DIAGNOSIS — G4700 Insomnia, unspecified: Secondary | ICD-10-CM

## 2017-04-11 MED ORDER — TRAZODONE HCL 100 MG PO TABS: 100.0000 mg | ORAL_TABLET | Freq: Every day | ORAL | 1 refills | Status: DC

## 2017-04-11 MED ORDER — METHYLPREDNISOLONE 4 MG PO TBPK: ORAL_TABLET | ORAL | 0 refills | Status: DC

## 2017-04-11 MED ORDER — MELOXICAM 15 MG PO TABS: ORAL_TABLET | ORAL | 0 refills | Status: DC

## 2017-04-11 MED ORDER — CHLORTHALIDONE 25 MG PO TABS: 25.0000 mg | ORAL_TABLET | Freq: Every day | ORAL | 1 refills | Status: DC

## 2017-04-11 NOTE — Progress Notes (Signed)
Musculoskeletal Exam  Patient: Javier King DOB: March 04, 1956  DOS: 04/11/2017  SUBJECTIVE:  Chief Complaint:   Chief Complaint  Patient presents with  . Leg Pain    left leg pain    Javier King is a 61 y.o.  male for evaluation and treatment of his back pain.   Onset:  2 weeks ago.  No injury or change in activity. Location: Lower L Character:  aching  Progression of issue:  is unchanged Associated symptoms: He feels that his left foot has decreased range of motion and weakness, he does feel better today Denies bowel/bladder incontinence or weakness Treatment: to date has been OTC NSAIDS and muscle relaxers.   Neurovascular symptoms: no He has a history of 3 back surgeries and foot drop.  He had weakness, decreased sensation, and an abnormal exam at that time.  ROS: Gastrointestinal: no bowel incontinence Genitourinary: No bladder incontinence Musculoskeletal/Extremities: +back pain Neurologic: no numbness, tingling no weakness   Past Medical History:  Diagnosis Date  . 10 year risk of MI or stroke 7.5% or greater 01/20/2016  . Anxiety   . 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 Cancer ([place]) [id number]   skin cancer  . DDD (degenerative disc disease), lumbar   . Depression 01/14/2016  . Erectile dysfunction   . Essential hypertension 01/14/2016  . History of TIA (transient ischemic attack)    per pt/ unsure if had TIA   . Hyperlipidemia   . Pericarditis 1986  . Tubular adenoma of colon    02/17/10   Current Outpatient Medications  Medication Sig Dispense Refill  . buPROPion (WELLBUTRIN) 100 MG tablet Take 1 tablet (100 mg total) by mouth 2 (two) times daily. 180 tablet 3  . chlorthalidone (HYGROTON) 25 MG tablet Take 1 tablet (25 mg total) by mouth daily. 90 tablet 1  . cyclobenzaprine (FLEXERIL) 5 MG tablet Take 1 tablet (5 mg total) by mouth 3 (three) times daily as needed for muscle spasms. 1-2 tablets by mouth every 8 hours as needed. 30 tablet 3   . lisinopril (PRINIVIL,ZESTRIL) 40 MG tablet Take 1 tablet (40 mg total) by mouth daily. 90 tablet 3  . meloxicam (MOBIC) 15 MG tablet TAKE 1 TABLET(15 MG) BY MOUTH DAILY 30 tablet 0  . tadalafil (CIALIS) 20 MG tablet Take 0.5-1 tablets (10-20 mg total) by mouth every other day as needed for erectile dysfunction. 5 tablet 1  . traZODone (DESYREL) 100 MG tablet Take 1 tablet (100 mg total) by mouth at bedtime. 90 tablet 1  . zolpidem (AMBIEN) 5 MG tablet Take 1 tablet (5 mg total) by mouth at bedtime as needed for sleep. 30 tablet 2  . methylPREDNISolone (MEDROL DOSEPAK) 4 MG TBPK tablet Follow instructions on package. 21 tablet 0   Allergies  Allergen Reactions  . Penicillins Itching   Objective:  VITAL SIGNS: BP 110/72 (BP Location: Left Arm, Patient Position: Sitting, Cuff Size: Large)   Pulse 75   Temp 97.6 F (36.4 C) (Oral)   Ht 6\' 1"  (1.854 m)   Wt 260 lb 4 oz (118 kg)   SpO2 97%   BMI 34.34 kg/m  Constitutional: Well formed, well developed. No acute distress. Musculoskeletal: low back.   Poor range of motion of hamstrings bilaterally Tenderness to palpation: Yes, over the left SI joint Deformity: no Ecchymosis: no Straight leg test: negative for 5/5 strength throughout Neurologic: Normal sensory function. No focal deficits noted. DTR's equal and symmetry in LE's. No clonus. Psychiatric: Normal mood. Age appropriate  judgment and insight. Alert & oriented x 3.    Assessment:  Sacroiliac joint pain  Essential hypertension - Plan: chlorthalidone (HYGROTON) 25 MG tablet  Anxiety associated with depression - Plan: traZODone (DESYREL) 100 MG tablet  Insomnia, unspecified type - Plan: traZODone (DESYREL) 100 MG tablet  Plan: His exam today is reassuring.  Medrol Dosepak.  Stretches and exercises/heat encouraged.  Offered injection, however he declined at this time.  Refills as above. Follow-up as needed. The patient voiced understanding and agreement to the  plan.   [place], DO 04/11/17  4:32 PM

## 2017-04-11 NOTE — Patient Instructions (Signed)
Stay diligent with heat and stretching.  I do not think this is related to a neurologic issue at this time. If anything changes, let me know right away.  Let us know if you need anything.

## 2017-04-11 NOTE — Progress Notes (Signed)
Pre visit review using our clinic review tool, if applicable. No additional management support is needed unless otherwise documented below in the visit note. 

## 2017-04-27 ENCOUNTER — Other Ambulatory Visit: Payer: Self-pay | Admitting: Family Medicine

## 2017-05-29 ENCOUNTER — Other Ambulatory Visit: Payer: Self-pay | Admitting: Family Medicine

## 2017-05-29 NOTE — Telephone Encounter (Signed)
Last Rx:  05/02/17, #30 LOV:  04/11/17 NOV: 06/28/17  Please advise quantity / refills?

## 2017-06-28 ENCOUNTER — Ambulatory Visit: Payer: Managed Care, Other (non HMO) | Admitting: Family Medicine

## 2017-06-28 ENCOUNTER — Encounter: Payer: Self-pay | Admitting: Family Medicine

## 2017-06-28 VITALS — BP 110/78 | HR 75 | Temp 98.1°F | Ht 73.0 in | Wt 266.4 lb

## 2017-06-28 DIAGNOSIS — M545 Low back pain, unspecified: Secondary | ICD-10-CM

## 2017-06-28 DIAGNOSIS — I10 Essential (primary) hypertension: Secondary | ICD-10-CM

## 2017-06-28 DIAGNOSIS — F418 Other specified anxiety disorders: Secondary | ICD-10-CM | POA: Diagnosis not present

## 2017-06-28 MED ORDER — CYCLOBENZAPRINE HCL 5 MG PO TABS: 5.0000 mg | ORAL_TABLET | Freq: Three times a day (TID) | ORAL | 3 refills | Status: DC | PRN

## 2017-06-28 NOTE — Progress Notes (Signed)
Chief Complaint  Patient presents with  . Follow-up    Subjective Javier King is a 62 y.o. male who presents for hypertension follow up. He does not monitor home blood pressures. Blood pressures ranging from 110's/70's on average. He is compliant with medications. Patient has these side effects of medication: none He is adhering to a healthy diet overall. Current exercise: cycling  He is also being treated for depression and anxiety with trazodone and Wellbutrin.  He has failed various SSRIs and SSRIs due to side effects.  He is doing well currently.  He is not following with a counselor or psychologist.  No thoughts of harming himself or others.  He uses Ambien around once per month.   Past Medical History:  Diagnosis Date  . 10 year risk of MI or stroke 7.5% or greater 01/20/2016  . Anxiety   . 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 Cancer ([place]) [id number]   skin cancer  . DDD (degenerative disc disease), lumbar   . Depression 01/14/2016  . Erectile dysfunction   . Essential hypertension 01/14/2016  . History of TIA (transient ischemic attack)    per pt/ unsure if had TIA   . Hyperlipidemia   . Pericarditis 1986  . Tubular adenoma of colon    02/17/10    Review of Systems Cardiovascular: no chest pain Respiratory:  no shortness of breath  Exam BP 110/78 (BP Location: Left Arm, Patient Position: Sitting, Cuff Size: Large)   Pulse 75   Temp 98.1 F (36.7 C) (Oral)   Ht 6\' 1"  (1.854 m)   Wt 266 lb 6 oz (120.8 kg)   SpO2 97%   BMI 35.14 kg/m  General:  well developed, well nourished, in no apparent distress Skin: warm, no pallor or diaphoresis Eyes: pupils equal and round, sclera anicteric without injection Heart: RRR, no bruits, 2+ b/l LE edema Lungs: clear to auscultation, no accessory muscle use Psych: well oriented with normal range of affect and appropriate judgment/insight  Essential hypertension  Anxiety associated with depression  Cont  regimen.  Counseled on diet and exercise- needs to get back with it.  F/u in for CPE when he turns 62. The patient voiced understanding and agreement to the plan.  [place], DO 06/28/17  4:13 PM

## 2017-06-28 NOTE — Patient Instructions (Signed)
Get back on the bike and exercising routinely.  Get back to eating a clean diet.  Let us know if you need anything.

## 2017-06-28 NOTE — Progress Notes (Signed)
Pre visit review using our clinic review tool, if applicable. No additional management support is needed unless otherwise documented below in the visit note. 

## 2017-07-10 ENCOUNTER — Other Ambulatory Visit: Payer: Self-pay | Admitting: Family Medicine

## 2017-07-10 DIAGNOSIS — M545 Low back pain, unspecified: Secondary | ICD-10-CM

## 2017-07-10 DIAGNOSIS — F418 Other specified anxiety disorders: Secondary | ICD-10-CM

## 2017-07-10 DIAGNOSIS — G4700 Insomnia, unspecified: Secondary | ICD-10-CM

## 2017-07-10 NOTE — Telephone Encounter (Signed)
Copied from [place]. Topic: Quick Communication - Rx Refill/Question >> Jul 10, 2017  9:30 AM Clack, Laban Emperor wrote: Medication: traZODone, lisinopril, meloxicam and cyclobenzaprine   Has the patient contacted their pharmacy? No. (new pharmacy)   (Agent: If no, request that the patient contact the pharmacy for the refill.)   Preferred Pharmacy (with phone number or street name): [facility], [address] [phone] (Phone) [phone] (Fax)     Agent: Please be advised that RX refills may take up to 3 business days. We ask that you follow-up with your pharmacy.

## 2017-07-11 MED ORDER — CYCLOBENZAPRINE HCL 5 MG PO TABS: 5.0000 mg | ORAL_TABLET | Freq: Three times a day (TID) | ORAL | 1 refills | Status: DC | PRN

## 2017-07-11 MED ORDER — TRAZODONE HCL 100 MG PO TABS: 100.0000 mg | ORAL_TABLET | Freq: Every day | ORAL | 3 refills | Status: DC

## 2017-07-11 MED ORDER — CYCLOBENZAPRINE HCL 5 MG PO TABS: 5.0000 mg | ORAL_TABLET | Freq: Three times a day (TID) | ORAL | 5 refills | Status: DC | PRN

## 2017-07-11 NOTE — Addendum Note (Signed)
Addended by: Ames Coupe on: 07/11/2017 07:41 AM   Modules accepted: Orders

## 2017-07-11 NOTE — Telephone Encounter (Addendum)
Refilled, can we cancel rx's I erroneously sent to [facility] please?Marland Kitchen

## 2017-07-11 NOTE — Telephone Encounter (Signed)
Advise on trazodone and flexeril refil

## 2017-07-16 ENCOUNTER — Ambulatory Visit: Payer: Self-pay

## 2017-07-16 MED ORDER — CYCLOBENZAPRINE HCL 5 MG PO TABS: 5.0000 mg | ORAL_TABLET | Freq: Two times a day (BID) | ORAL | 1 refills | Status: DC | PRN

## 2017-07-16 NOTE — Telephone Encounter (Signed)
There are 2 different sets of directions for flexeril and the pharmacy needs clarification on this.

## 2017-07-16 NOTE — Telephone Encounter (Signed)
[facility] needs verification of Flexeril - there are two sets of directions on prescription they received. [phone].

## 2017-07-16 NOTE — Telephone Encounter (Signed)
See Medication refill note.

## 2017-07-16 NOTE — Addendum Note (Signed)
Addended by: Sharon Seller B on: 07/16/2017 03:40 PM   Modules accepted: Orders

## 2017-07-16 NOTE — Telephone Encounter (Signed)
Take 1 tab twice daily as needed for muscle spasms. TY.

## 2017-07-22 ENCOUNTER — Other Ambulatory Visit: Payer: Self-pay | Admitting: Family Medicine

## 2017-08-14 ENCOUNTER — Other Ambulatory Visit: Payer: Self-pay | Admitting: Family Medicine

## 2017-08-14 DIAGNOSIS — I10 Essential (primary) hypertension: Secondary | ICD-10-CM

## 2017-08-14 NOTE — Telephone Encounter (Signed)
Rx approved and sent to the pharmacy by e-script.//AB/CMA 

## 2017-08-16 ENCOUNTER — Other Ambulatory Visit: Payer: Self-pay | Admitting: Family Medicine

## 2017-08-16 DIAGNOSIS — M545 Low back pain, unspecified: Secondary | ICD-10-CM

## 2017-08-28 ENCOUNTER — Other Ambulatory Visit: Payer: Self-pay | Admitting: Family Medicine

## 2017-09-25 ENCOUNTER — Other Ambulatory Visit: Payer: Self-pay | Admitting: Family Medicine

## 2017-10-01 ENCOUNTER — Other Ambulatory Visit: Payer: Self-pay | Admitting: Family Medicine

## 2017-10-01 DIAGNOSIS — F418 Other specified anxiety disorders: Secondary | ICD-10-CM

## 2017-10-01 DIAGNOSIS — G4700 Insomnia, unspecified: Secondary | ICD-10-CM

## 2017-10-01 NOTE — Telephone Encounter (Signed)
Copied from [place] [id number]. Topic: Quick Communication - Rx Refill/Question >> Oct 01, 2017  1:31 PM Bea Graff, NT wrote: Medication: zolpidem (AMBIEN) 5 MG tablet  Has the patient contacted their pharmacy? Yes.   (Agent: If no, request that the patient contact the pharmacy for the refill.) Preferred Pharmacy (with phone number or street name): [id number] - [place], [place] - [address] AT [address] [phone] (Phone) [phone] (Fax)     Agent: Please be advised that RX refills may take up to 3 business days. We ask that you follow-up with your pharmacy.

## 2017-10-02 NOTE — Telephone Encounter (Signed)
Refill request for Zolpidem (Ambien) 5mg , last filled on 12/28/16 #30 with 2 refills  LOV: 06/28/17 Dr. Nani Ravens  [facility]     [address]  [facility]

## 2017-10-02 NOTE — Telephone Encounter (Signed)
Last office visit on 06/28/2017 Last refill on 12/28/2017  #30 with 2 refills.

## 2017-10-02 NOTE — Addendum Note (Signed)
Addended by: Dimple Nanas on: 10/02/2017 12:00 PM   Modules accepted: Orders

## 2017-10-03 ENCOUNTER — Other Ambulatory Visit: Payer: Self-pay | Admitting: Family Medicine

## 2017-10-03 DIAGNOSIS — F418 Other specified anxiety disorders: Secondary | ICD-10-CM

## 2017-10-03 DIAGNOSIS — G4700 Insomnia, unspecified: Secondary | ICD-10-CM

## 2017-10-03 MED ORDER — ZOLPIDEM TARTRATE 5 MG PO TABS: 5.0000 mg | ORAL_TABLET | Freq: Every evening | ORAL | 2 refills | Status: DC | PRN

## 2017-10-11 ENCOUNTER — Other Ambulatory Visit: Payer: Self-pay | Admitting: Family Medicine

## 2017-10-12 NOTE — Telephone Encounter (Signed)
Pt called in to follow up on Rx. Pt take medication for back spasms.    [phone] - pt would like to have confirmation on when/if Rx if filled.   Pharmacy:  [facility] [place], [place] - [address] AT [place] [phone] (Phone) [phone] (Fax

## 2017-10-12 NOTE — Telephone Encounter (Signed)
Please advise in PCPs absence.  

## 2017-11-14 ENCOUNTER — Other Ambulatory Visit: Payer: Self-pay | Admitting: Family Medicine

## 2017-11-14 DIAGNOSIS — I10 Essential (primary) hypertension: Secondary | ICD-10-CM

## 2017-11-14 MED ORDER — CHLORTHALIDONE 25 MG PO TABS: 25.0000 mg | ORAL_TABLET | Freq: Every day | ORAL | 1 refills | Status: DC

## 2017-12-12 ENCOUNTER — Telehealth: Payer: Self-pay

## 2017-12-12 DIAGNOSIS — I10 Essential (primary) hypertension: Secondary | ICD-10-CM

## 2017-12-12 MED ORDER — LISINOPRIL 40 MG PO TABS: 40.0000 mg | ORAL_TABLET | Freq: Every day | ORAL | 1 refills | Status: DC

## 2017-12-12 NOTE — Telephone Encounter (Signed)
Paper rx request for lisinopril received, and medication filled per protocol.

## 2017-12-26 ENCOUNTER — Other Ambulatory Visit: Payer: Self-pay

## 2017-12-26 DIAGNOSIS — I10 Essential (primary) hypertension: Secondary | ICD-10-CM

## 2017-12-26 MED ORDER — LISINOPRIL 40 MG PO TABS: 40.0000 mg | ORAL_TABLET | Freq: Every day | ORAL | 1 refills | Status: DC

## 2018-02-28 ENCOUNTER — Encounter: Payer: Self-pay | Admitting: Family Medicine

## 2018-02-28 ENCOUNTER — Ambulatory Visit (INDEPENDENT_AMBULATORY_CARE_PROVIDER_SITE_OTHER): Payer: Managed Care, Other (non HMO) | Admitting: Family Medicine

## 2018-02-28 VITALS — BP 120/72 | HR 71 | Temp 97.8°F | Ht 73.0 in | Wt 284.4 lb

## 2018-02-28 DIAGNOSIS — G4700 Insomnia, unspecified: Secondary | ICD-10-CM

## 2018-02-28 DIAGNOSIS — Z1159 Encounter for screening for other viral diseases: Secondary | ICD-10-CM | POA: Diagnosis not present

## 2018-02-28 DIAGNOSIS — Z0000 Encounter for general adult medical examination without abnormal findings: Secondary | ICD-10-CM | POA: Diagnosis not present

## 2018-02-28 DIAGNOSIS — Z23 Encounter for immunization: Secondary | ICD-10-CM

## 2018-02-28 DIAGNOSIS — F418 Other specified anxiety disorders: Secondary | ICD-10-CM | POA: Diagnosis not present

## 2018-02-28 LAB — COMPREHENSIVE METABOLIC PANEL
ALT: 29 U/L (ref 0–53)
AST: 22 U/L (ref 0–37)
Albumin: 4.1 g/dL (ref 3.5–5.2)
Alkaline Phosphatase: 39 U/L (ref 39–117)
BILIRUBIN TOTAL: 0.7 mg/dL (ref 0.2–1.2)
BUN: 15 mg/dL (ref 6–23)
CALCIUM: 9.8 mg/dL (ref 8.4–10.5)
CHLORIDE: 97 meq/L (ref 96–112)
CO2: 29 meq/L (ref 19–32)
Creatinine, Ser: 1.33 mg/dL (ref 0.40–1.50)
GFR: 58.28 mL/min — AB (ref >60.00)
Glucose, Bld: 106 mg/dL — ABNORMAL HIGH (ref 70–99)
POTASSIUM: 4 meq/L (ref 3.5–5.1)
Sodium: 133 mEq/L — ABNORMAL LOW (ref 135–145)
Total Protein: 6.7 g/dL (ref 6.0–8.3)

## 2018-02-28 LAB — LIPID PANEL
Cholesterol: 207 mg/dL — ABNORMAL HIGH (ref 0–200)
HDL: 38.3 mg/dL — ABNORMAL LOW (ref >39.00)
LDL CALC: 134 mg/dL — AB (ref 0–99)
NONHDL: 168.51
TRIGLYCERIDES: 174 mg/dL — AB (ref 0.0–149.0)
Total CHOL/HDL Ratio: 5
VLDL: 34.8 mg/dL (ref 0.0–40.0)

## 2018-02-28 MED ORDER — BUPROPION HCL 100 MG PO TABS: 100.0000 mg | ORAL_TABLET | Freq: Two times a day (BID) | ORAL | 3 refills | Status: DC

## 2018-02-28 MED ORDER — ZOLPIDEM TARTRATE 5 MG PO TABS: 5.0000 mg | ORAL_TABLET | Freq: Every evening | ORAL | 0 refills | Status: DC | PRN

## 2018-02-28 NOTE — Patient Instructions (Addendum)
It was good to see you today.  Give [place] 2-3 business days to get the results of your labs back.   Goal weight loss: 15-20 lb weight loss  Get back on wagon with your diet and exercising.  Let us know if you need anything.

## 2018-02-28 NOTE — Progress Notes (Signed)
Chief Complaint  Patient presents with  . Annual Exam    Well Male Javier King is here for a complete physical.   His last physical was >1 year ago.  Current diet: in general, a poor diet as of late.  Current exercise: none lately  Weight trend: has gained. Daytime fatigue? Some Seat belt? Yes.    Health maintenance Shingrix- No Colonoscopy- Yes Tetanus- No HIV- Yes Hep C- No Prostate cancer screening- No   Past Medical History:  Diagnosis Date  . 10 year risk of MI or stroke 7.5% or greater 01/20/2016  . 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 Cancer ([place]) [id number]   skin cancer  . DDD (degenerative disc disease), lumbar   . Depression 01/14/2016  . Erectile dysfunction   . Essential hypertension 01/14/2016  . History of TIA (transient ischemic attack)    per pt/ unsure if had TIA   . Hyperlipidemia   . Pericarditis 1986  . Tubular adenoma of colon    02/17/10      Past Surgical History:  Procedure Laterality Date  . COLONOSCOPY    . OTHER SURGICAL HISTORY  2003,2004   Back surgery/ 2 laminectomies in lower back  . TONSILLECTOMY      Medications  Current Outpatient Medications on File Prior to Visit  Medication Sig Dispense Refill  . chlorthalidone (HYGROTON) 25 MG tablet Take 1 tablet (25 mg total) by mouth daily. 90 tablet 1  . cyclobenzaprine (FLEXERIL) 5 MG tablet Take 1 tablet (5 mg total) by mouth 2 (two) times daily as needed. 180 tablet 1  . lisinopril (PRINIVIL,ZESTRIL) 40 MG tablet Take 1 tablet (40 mg total) by mouth daily. 90 tablet 1  . meloxicam (MOBIC) 15 MG tablet TAKE 1 TABLET(15 MG) BY MOUTH DAILY 30 tablet 0  . tadalafil (CIALIS) 20 MG tablet Take 0.5-1 tablets (10-20 mg total) by mouth every other day as needed for erectile dysfunction. 5 tablet 1  . traZODone (DESYREL) 100 MG tablet Take 1 tablet (100 mg total) by mouth at bedtime. 90 tablet 3  . zolpidem (AMBIEN) 5 MG tablet Take 1 tablet (5 mg total) by mouth at bedtime as  needed for sleep. 30 tablet 2  . buPROPion (WELLBUTRIN) 100 MG tablet Take 1 tablet (100 mg total) by mouth 2 (two) times daily. 180 tablet 3   Allergies Allergies  Allergen Reactions  . Penicillins Itching    Family History Family History  Problem Relation Age of Onset  . Heart disease Mother   . Diabetes Sister   . Alzheimer's disease Father   . Parkinsonism Father   . Leukemia Sister     Review of Systems: Constitutional:  no fevers Eye:  no recent significant change in vision Ear/Nose/Mouth/Throat:  Ears:  no hearing loss Nose/Mouth/Throat:  no complaints of nasal congestion, no sore throat Cardiovascular:  no chest pain, no palpitations Respiratory:  no cough and no shortness of breath Gastrointestinal:  no abdominal pain, no change in bowel habits GU:  Male: negative for dysuria, frequency, and incontinence and negative for prostate symptoms Musculoskeletal/Extremities: +chronic back pain; otherwise no pain, redness, or swelling of the joints Integumentary (Skin/Breast):  no abnormal skin lesions reported Neurologic:  no headaches Endocrine: No unexpected weight changes Hematologic/Lymphatic:  no abnormal bleeding  Exam BP 120/72 (BP Location: Left Arm, Patient Position: Sitting, Cuff Size: Large)   Pulse 71   Temp 97.8 F (36.6 C) (Oral)   Ht 6\' 1"  (1.854 m)   Wt 284  lb 6 oz (129 kg)   SpO2 96%   BMI 37.52 kg/m  General:  well developed, well nourished, in no apparent distress Skin:  no significant moles, warts, or growths Head:  no masses, lesions, or tenderness Eyes:  pupils equal and round, sclera anicteric without injection Ears:  canals without lesions, TMs shiny without retraction, no obvious effusion, no erythema Nose:  nares patent, septum midline, mucosa normal Throat/Pharynx:  lips and gingiva without lesion; tongue and uvula midline; non-inflamed pharynx; no exudates or postnasal drainage Neck: neck supple without adenopathy, thyromegaly, or  masses Lungs:  clear to auscultation, breath sounds equal bilaterally, no respiratory distress Cardio:  regular rate and rhythm, no LE edema, no bruits Abdomen:  abdomen soft, nontender; bowel sounds normal; no masses or organomegaly Genital (male): circumcised penis, no lesions or discharge; testes present bilaterally without masses or tenderness Rectal: Deferred Musculoskeletal:  symmetrical muscle groups noted without atrophy or deformity Extremities:  no clubbing, cyanosis, or edema, no deformities, no skin discoloration Neuro:  gait normal; deep tendon reflexes normal and symmetric Psych: well oriented with normal range of affect and appropriate judgment/insight  Assessment and Plan  Well adult exam - Plan: Comprehensive metabolic panel, Lipid panel  Anxiety associated with depression - Plan: zolpidem (AMBIEN) 5 MG tablet, buPROPion (WELLBUTRIN) 100 MG tablet  Insomnia, unspecified type - Plan: zolpidem (AMBIEN) 5 MG tablet  Encounter for hepatitis C screening test for low risk patient - Plan: Hepatitis C antibody   Well 62 y.o. male. Counseled on diet and exercise. Counseled on risks and benefits of prostate cancer screening with PSA. The patient agrees to forego testing. Immunizations, labs, and further orders as above. Update imms. Follow up in 6 mo or prn. The patient voiced understanding and agreement to the plan.  [place], DO 02/28/18 7:29 AM

## 2018-02-28 NOTE — Addendum Note (Signed)
Addended by: Sharon Seller B on: 02/28/2018 07:40 AM   Modules accepted: Orders

## 2018-02-28 NOTE — Progress Notes (Signed)
Pre visit review using our clinic review tool, if applicable. No additional management support is needed unless otherwise documented below in the visit note. 

## 2018-03-01 LAB — HEPATITIS C ANTIBODY
HEP C AB: NONREACTIVE
SIGNAL TO CUT-OFF: 0.05 (ref <1.00)

## 2018-04-08 ENCOUNTER — Other Ambulatory Visit: Payer: Self-pay | Admitting: Family Medicine

## 2018-05-04 ENCOUNTER — Other Ambulatory Visit: Payer: Self-pay | Admitting: Family Medicine

## 2018-05-07 ENCOUNTER — Encounter: Payer: Self-pay | Admitting: Family Medicine

## 2018-05-07 ENCOUNTER — Other Ambulatory Visit: Payer: Self-pay | Admitting: Family Medicine

## 2018-05-07 ENCOUNTER — Ambulatory Visit: Payer: Managed Care, Other (non HMO) | Admitting: Family Medicine

## 2018-05-07 VITALS — BP 110/78 | HR 86 | Temp 98.3°F | Ht 73.0 in | Wt 277.5 lb

## 2018-05-07 DIAGNOSIS — M545 Low back pain, unspecified: Secondary | ICD-10-CM

## 2018-05-07 MED ORDER — MELOXICAM 15 MG PO TABS: 15.0000 mg | ORAL_TABLET | Freq: Every day | ORAL | 0 refills | Status: DC

## 2018-05-07 MED ORDER — METHYLPREDNISOLONE 4 MG PO TBPK: ORAL_TABLET | ORAL | 0 refills | Status: DC

## 2018-05-07 MED ORDER — CYCLOBENZAPRINE HCL 5 MG PO TABS: 5.0000 mg | ORAL_TABLET | Freq: Two times a day (BID) | ORAL | 1 refills | Status: DC | PRN

## 2018-05-07 NOTE — Patient Instructions (Signed)
Ice/cold pack over area for 10-15 min twice daily.  Heat (pad or rice pillow in microwave) over affected area, 10-15 minutes twice daily.   OK to take Tylenol 1000 mg (2 extra strength tabs) or 975 mg (3 regular strength tabs) every 6 hours as needed.  Go back to your stretches and the exercises you are able to tolerate.  Let us know if you need anything.

## 2018-05-07 NOTE — Progress Notes (Signed)
Pre visit review using our clinic review tool, if applicable. No additional management support is needed unless otherwise documented below in the visit note. 

## 2018-05-07 NOTE — Progress Notes (Signed)
Musculoskeletal Exam  Patient: Javier King DOB: 1955/10/03  DOS: 05/07/2018  SUBJECTIVE:  Chief Complaint:   Chief Complaint  Patient presents with  . Back Pain    Javier King is a 62 y.o.  male for evaluation and treatment of his back pain.   Onset:  3 days ago. Was getting out of bed when it happened.  Location: lower R Character:  aching and sharp  Progression of issue:  is unchanged Associated symptoms: Some radiation down legs Denies bowel/bladder incontinence or weakness Treatment: to date has been muscle relaxers.   Neurovascular symptoms: no  ROS: Musculoskeletal/Extremities: +back pain  Past Medical History:  Diagnosis Date  . 10 year risk of MI or stroke 7.5% or greater 01/20/2016  . 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 Cancer ([place]) [id number]   skin cancer  . DDD (degenerative disc disease), lumbar   . Depression 01/14/2016  . Erectile dysfunction   . Essential hypertension 01/14/2016  . History of TIA (transient ischemic attack)    per pt/ unsure if had TIA   . Hyperlipidemia   . Pericarditis 1986  . Tubular adenoma of colon    02/17/10    Objective:  VITAL SIGNS: BP 110/78 (BP Location: Left Arm, Patient Position: Sitting, Cuff Size: Large)   Pulse 86   Temp 98.3 F (36.8 C) (Oral)   Ht 6\' 1"  (1.854 m)   Wt 277 lb 8 oz (125.9 kg)   SpO2 98%   BMI 36.61 kg/m  Constitutional: Well formed, well developed. No acute distress. HENT: Normocephalic, atraumatic.  Thorax & Lungs:  No accessory muscle use Extremities: No clubbing. No cyanosis. No edema.  Skin: Warm. Dry. No erythema. No rash.  Musculoskeletal: low back.   Tenderness to palpation: Yes, R lumbar region Deformity: no Ecchymosis: no Straight leg test: negative for Poor hamstring flexibility b/l. Neurologic: Normal sensory function. No focal deficits noted. 5/5 strength throughout. Psychiatric: Normal mood. Age appropriate judgment and insight. Alert & oriented x 3.     Assessment:  Acute right-sided low back pain without sciatica - Plan: cyclobenzaprine (FLEXERIL) 5 MG tablet, methylPREDNISolone (MEDROL DOSEPAK) 4 MG TBPK tablet  Plan: Orders as above. Stretches/exercises, heat, ice, Tylenol. F/u prn. The patient voiced understanding and agreement to the plan.   [place], DO 05/07/18  3:12 PM

## 2018-06-02 ENCOUNTER — Other Ambulatory Visit: Payer: Self-pay | Admitting: Family Medicine

## 2018-06-04 ENCOUNTER — Ambulatory Visit (INDEPENDENT_AMBULATORY_CARE_PROVIDER_SITE_OTHER): Payer: BLUE CROSS/BLUE SHIELD

## 2018-06-04 DIAGNOSIS — Z23 Encounter for immunization: Secondary | ICD-10-CM | POA: Diagnosis not present

## 2018-06-25 ENCOUNTER — Other Ambulatory Visit: Payer: Self-pay | Admitting: Family Medicine

## 2018-06-25 DIAGNOSIS — I10 Essential (primary) hypertension: Secondary | ICD-10-CM

## 2018-07-30 ENCOUNTER — Other Ambulatory Visit: Payer: Self-pay | Admitting: Family Medicine

## 2018-07-30 DIAGNOSIS — I10 Essential (primary) hypertension: Secondary | ICD-10-CM

## 2018-07-31 ENCOUNTER — Other Ambulatory Visit: Payer: Self-pay | Admitting: Family Medicine

## 2018-07-31 DIAGNOSIS — G4700 Insomnia, unspecified: Secondary | ICD-10-CM

## 2018-07-31 DIAGNOSIS — F418 Other specified anxiety disorders: Secondary | ICD-10-CM

## 2018-07-31 MED ORDER — ZOLPIDEM TARTRATE 5 MG PO TABS: 5.0000 mg | ORAL_TABLET | Freq: Every evening | ORAL | 0 refills | Status: DC | PRN

## 2018-08-02 ENCOUNTER — Other Ambulatory Visit: Payer: Self-pay | Admitting: Family Medicine

## 2018-08-02 DIAGNOSIS — G4700 Insomnia, unspecified: Secondary | ICD-10-CM

## 2018-08-02 DIAGNOSIS — F418 Other specified anxiety disorders: Secondary | ICD-10-CM

## 2018-08-30 ENCOUNTER — Ambulatory Visit: Payer: Managed Care, Other (non HMO) | Admitting: Family Medicine

## 2018-09-02 ENCOUNTER — Other Ambulatory Visit: Payer: Self-pay | Admitting: Family Medicine

## 2018-09-02 ENCOUNTER — Ambulatory Visit: Payer: BLUE CROSS/BLUE SHIELD | Admitting: Family Medicine

## 2018-09-13 ENCOUNTER — Telehealth: Payer: Self-pay | Admitting: Family Medicine

## 2018-09-13 NOTE — Telephone Encounter (Signed)
Called and did speak to the patient to let him know if having any problems currently we are offering Virtual Visits. He appreciated the call and stated will keep it in mind and appreciated all Health Care workers are doing currently.

## 2018-10-28 ENCOUNTER — Other Ambulatory Visit: Payer: Self-pay | Admitting: Family Medicine

## 2018-10-28 MED ORDER — MELOXICAM 15 MG PO TABS: 15.0000 mg | ORAL_TABLET | Freq: Every day | ORAL | 0 refills | Status: DC

## 2018-11-29 ENCOUNTER — Other Ambulatory Visit: Payer: Self-pay | Admitting: Family Medicine

## 2018-11-29 DIAGNOSIS — M545 Low back pain, unspecified: Secondary | ICD-10-CM

## 2019-01-08 ENCOUNTER — Telehealth: Payer: Self-pay | Admitting: *Deleted

## 2019-01-08 DIAGNOSIS — G4700 Insomnia, unspecified: Secondary | ICD-10-CM

## 2019-01-08 DIAGNOSIS — F418 Other specified anxiety disorders: Secondary | ICD-10-CM

## 2019-01-08 MED ORDER — ZOLPIDEM TARTRATE 5 MG PO TABS: 5.0000 mg | ORAL_TABLET | Freq: Every evening | ORAL | 0 refills | Status: DC | PRN

## 2019-01-08 NOTE — Telephone Encounter (Signed)
[facility] ambien.

## 2019-02-11 ENCOUNTER — Encounter: Payer: Self-pay | Admitting: Family Medicine

## 2019-02-11 ENCOUNTER — Ambulatory Visit (INDEPENDENT_AMBULATORY_CARE_PROVIDER_SITE_OTHER): Payer: BC Managed Care – PPO | Admitting: Family Medicine

## 2019-02-11 ENCOUNTER — Other Ambulatory Visit: Payer: Self-pay

## 2019-02-11 VITALS — BP 122/72 | HR 81 | Temp 96.0°F | Ht 73.0 in | Wt 278.1 lb

## 2019-02-11 DIAGNOSIS — Z23 Encounter for immunization: Secondary | ICD-10-CM | POA: Diagnosis not present

## 2019-02-11 DIAGNOSIS — M545 Low back pain, unspecified: Secondary | ICD-10-CM

## 2019-02-11 DIAGNOSIS — R5383 Other fatigue: Secondary | ICD-10-CM | POA: Diagnosis not present

## 2019-02-11 MED ORDER — METHYLPREDNISOLONE 4 MG PO TBPK: ORAL_TABLET | ORAL | 0 refills | Status: DC

## 2019-02-11 NOTE — Progress Notes (Signed)
Chief Complaint  Patient presents with  . Fatigue  . Back Pain    Subjective: Patient is a 63 y.o. male here for fatigue.  This has been going on for past 6 mo. Has been walking TID for 15 min at a time. He has been working from home during that time. Notices it is usually a couple hours after eating. Does not happen when he skips breakfast as he usually does. No snoring, gets around 9 hrs of sleep nightly. Mood has been stable on current medication regimen. Diet is poor for breakfast and lunch. Weight is stable without any unexpected changes.   He is also having a flare of his back pain. This happens from time to time. He has difficulty doing HEP 2/2 discomfort from the stretches/exercises. Has been to PT without much success. No bowel/bladder incontinence. Medrol dospaks typically do well. No recent inj or change in activity, has been steadily worsening over past 3 weeks. He is using Flexeril prn with relief.    ROS: Endo: +fatigue, no wt changes MSK: +back pain  Past Medical History:  Diagnosis Date  . 10 year risk of MI or stroke 7.5% or greater 01/20/2016  . 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 Cancer ([place]) [id number]   skin cancer  . DDD (degenerative disc disease), lumbar   . Depression 01/14/2016  . Erectile dysfunction   . Essential hypertension 01/14/2016  . History of TIA (transient ischemic attack)    per pt/ unsure if had TIA   . Hyperlipidemia   . Pericarditis 1986  . Tubular adenoma of colon    02/17/10    Objective: BP 122/72 (BP Location: Left Arm, Patient Position: Sitting, Cuff Size: Large)   Pulse 81   Temp (!) 96 F (35.6 C) (Temporal)   Ht 6\' 1"  (1.854 m)   Wt 278 lb 2 oz (126.2 kg)   SpO2 97%   BMI 36.69 kg/m  General: Awake, appears stated age HEENT: MMM, EOMi Heart: RRR, no LE edema Lungs: CTAB, no rales, wheezes or rhonchi. No accessory muscle use MSK: Mild ttp over lumbar parasp msc b/l, +hamstring tightness b/l Neuro: Mildly  antalgic gait, no cerebellar signs, neg straight leg b/l Psych: Age appropriate judgment and insight, normal affect and mood  Assessment and Plan: Fatigue, unspecified type - Plan: CBC, Comprehensive metabolic panel, TSH, T4, free, Vitamin D (25 hydroxy)  Acute right-sided low back pain without sciatica - Plan: methylPREDNISolone (MEDROL DOSEPAK) 4 MG TBPK tablet  Need for influenza vaccination - Plan: Flu Vaccine QUAD 6+ mos PF IM (Fluarix Quad PF)  Ck labs, this is likely related to his poor diet and is a "sugar crash". Consider higher protein and more complex carbs. Counseled on diet and exercise. BP good today. Cont Flexeril, heat, add steroid. Try to do stretches if able. Discussed referral to PT vs PM&R, he has had injections and did not do well. F/u in 6 mo for CPE or prn.  The patient voiced understanding and agreement to the plan.  [place], DO 02/11/19  8:22 PM

## 2019-02-11 NOTE — Patient Instructions (Addendum)
Keep the diet clean and stay active.  Try to clean up the diet when you eat. This could be a "sugar crash".   Continue exercising. Consider stretching/yoga as able.   Heat (pad or rice pillow in microwave) over affected area, 10-15 minutes twice daily.   Ice/cold pack over area for 10-15 min twice daily.  Let us know if you need anything.

## 2019-02-12 LAB — CBC
HCT: 41.1 % (ref 39.0–52.0)
Hemoglobin: 14.3 g/dL (ref 13.0–17.0)
MCHC: 34.8 g/dL (ref 30.0–36.0)
MCV: 94 fl (ref 78.0–100.0)
Platelets: 237 10*3/uL (ref 150.0–400.0)
RBC: 4.38 Mil/uL (ref 4.22–5.81)
RDW: 13.2 % (ref 11.5–15.5)
WBC: 9.3 10*3/uL (ref 4.0–10.5)

## 2019-02-12 LAB — COMPREHENSIVE METABOLIC PANEL
ALT: 39 U/L (ref 0–53)
AST: 32 U/L (ref 0–37)
Albumin: 4.3 g/dL (ref 3.5–5.2)
Alkaline Phosphatase: 45 U/L (ref 39–117)
BUN: 13 mg/dL (ref 6–23)
CO2: 25 mEq/L (ref 19–32)
Calcium: 9.7 mg/dL (ref 8.4–10.5)
Chloride: 91 mEq/L — ABNORMAL LOW (ref 96–112)
Creatinine, Ser: 1.02 mg/dL (ref 0.40–1.50)
GFR: 74.25 mL/min (ref >60.00)
Glucose, Bld: 87 mg/dL (ref 70–99)
Potassium: 4.7 mEq/L (ref 3.5–5.1)
Sodium: 127 mEq/L — ABNORMAL LOW (ref 135–145)
Total Bilirubin: 0.7 mg/dL (ref 0.2–1.2)
Total Protein: 6.9 g/dL (ref 6.0–8.3)

## 2019-02-12 LAB — T4, FREE: Free T4: 1.08 ng/dL (ref 0.60–1.60)

## 2019-02-12 LAB — TSH: TSH: 2.55 u[IU]/mL (ref 0.35–4.50)

## 2019-02-12 LAB — VITAMIN D 25 HYDROXY (VIT D DEFICIENCY, FRACTURES): VITD: 24.36 ng/mL — ABNORMAL LOW (ref 30.00–100.00)

## 2019-02-14 ENCOUNTER — Other Ambulatory Visit: Payer: Self-pay | Admitting: Family Medicine

## 2019-02-14 DIAGNOSIS — E871 Hypo-osmolality and hyponatremia: Secondary | ICD-10-CM

## 2019-02-20 ENCOUNTER — Other Ambulatory Visit (INDEPENDENT_AMBULATORY_CARE_PROVIDER_SITE_OTHER): Payer: BC Managed Care – PPO

## 2019-02-20 ENCOUNTER — Other Ambulatory Visit: Payer: Self-pay

## 2019-02-20 ENCOUNTER — Other Ambulatory Visit: Payer: BC Managed Care – PPO

## 2019-02-20 DIAGNOSIS — E871 Hypo-osmolality and hyponatremia: Secondary | ICD-10-CM

## 2019-02-21 ENCOUNTER — Other Ambulatory Visit: Payer: Self-pay | Admitting: Family Medicine

## 2019-02-21 DIAGNOSIS — E871 Hypo-osmolality and hyponatremia: Secondary | ICD-10-CM

## 2019-02-21 LAB — BASIC METABOLIC PANEL
BUN: 14 mg/dL (ref 6–23)
CO2: 28 mEq/L (ref 19–32)
Calcium: 9.6 mg/dL (ref 8.4–10.5)
Chloride: 92 mEq/L — ABNORMAL LOW (ref 96–112)
Creatinine, Ser: 1.03 mg/dL (ref 0.40–1.50)
GFR: 73.41 mL/min (ref >60.00)
Glucose, Bld: 84 mg/dL (ref 70–99)
Potassium: 4 mEq/L (ref 3.5–5.1)
Sodium: 130 mEq/L — ABNORMAL LOW (ref 135–145)

## 2019-02-28 ENCOUNTER — Other Ambulatory Visit: Payer: Self-pay

## 2019-02-28 ENCOUNTER — Other Ambulatory Visit (INDEPENDENT_AMBULATORY_CARE_PROVIDER_SITE_OTHER): Payer: BC Managed Care – PPO

## 2019-02-28 DIAGNOSIS — E871 Hypo-osmolality and hyponatremia: Secondary | ICD-10-CM

## 2019-02-28 NOTE — Addendum Note (Signed)
Addended by: Caffie Pinto on: 02/28/2019 02:43 PM   Modules accepted: Orders

## 2019-03-01 LAB — BASIC METABOLIC PANEL
BUN: 11 mg/dL (ref 7–25)
CO2: 26 mmol/L (ref 20–32)
Calcium: 9.5 mg/dL (ref 8.6–10.3)
Chloride: 97 mmol/L — ABNORMAL LOW (ref 98–110)
Creat: 1.04 mg/dL (ref 0.70–1.25)
Glucose, Bld: 96 mg/dL (ref 65–99)
Potassium: 4.1 mmol/L (ref 3.5–5.3)
Sodium: 132 mmol/L — ABNORMAL LOW (ref 135–146)

## 2019-03-31 ENCOUNTER — Other Ambulatory Visit: Payer: Self-pay | Admitting: Family Medicine

## 2019-03-31 ENCOUNTER — Telehealth: Payer: Self-pay

## 2019-03-31 DIAGNOSIS — L905 Scar conditions and fibrosis of skin: Secondary | ICD-10-CM

## 2019-03-31 NOTE — Telephone Encounter (Signed)
Referral done

## 2019-03-31 NOTE — Telephone Encounter (Signed)
Copied from [place] #[id number]. Topic: Referral - Request for Referral >> Mar 31, 2019  8:50 AM Virl Axe D wrote: Has patient seen PCP for this complaint? No *If NO, is insurance requiring patient see PCP for this issue before PCP can refer them? Referral for which specialty: Dermatology Preferred provider/office: In network Reason for referral: Pt had a large area removed from his shoulder years ago and has noticed the same scarring starting to appear again

## 2019-03-31 NOTE — Progress Notes (Signed)
am

## 2019-03-31 NOTE — Telephone Encounter (Signed)
OK 

## 2019-04-01 DIAGNOSIS — M9902 Segmental and somatic dysfunction of thoracic region: Secondary | ICD-10-CM | POA: Diagnosis not present

## 2019-04-01 DIAGNOSIS — M5020 Other cervical disc displacement, unspecified cervical region: Secondary | ICD-10-CM | POA: Diagnosis not present

## 2019-04-01 DIAGNOSIS — M9903 Segmental and somatic dysfunction of lumbar region: Secondary | ICD-10-CM | POA: Diagnosis not present

## 2019-04-01 DIAGNOSIS — M9901 Segmental and somatic dysfunction of cervical region: Secondary | ICD-10-CM | POA: Diagnosis not present

## 2019-04-04 ENCOUNTER — Encounter: Payer: Self-pay | Admitting: Family Medicine

## 2019-04-04 ENCOUNTER — Ambulatory Visit (INDEPENDENT_AMBULATORY_CARE_PROVIDER_SITE_OTHER): Payer: BC Managed Care – PPO | Admitting: Family Medicine

## 2019-04-04 ENCOUNTER — Other Ambulatory Visit: Payer: Self-pay

## 2019-04-04 VITALS — BP 130/82 | HR 80 | Temp 96.5°F | Ht 73.0 in | Wt 278.0 lb

## 2019-04-04 DIAGNOSIS — M542 Cervicalgia: Secondary | ICD-10-CM | POA: Diagnosis not present

## 2019-04-04 MED ORDER — KETOROLAC TROMETHAMINE 60 MG/2ML IM SOLN
60.0000 mg | Freq: Once | INTRAMUSCULAR | Status: AC
  Administered 2019-04-04: 60 mg via INTRAMUSCULAR

## 2019-04-04 MED ORDER — PREDNISONE 20 MG PO TABS: 40.0000 mg | ORAL_TABLET | Freq: Every day | ORAL | 0 refills | Status: DC

## 2019-04-04 MED ORDER — TIZANIDINE HCL 4 MG PO TABS: 4.0000 mg | ORAL_TABLET | Freq: Four times a day (QID) | ORAL | 0 refills | Status: DC | PRN

## 2019-04-04 NOTE — Progress Notes (Signed)
Musculoskeletal Exam  Patient: Javier King DOB: [birth year]  DOS: 04/04/2019  SUBJECTIVE:  Chief Complaint:   Chief Complaint  Patient presents with  . Arm Pain    right  . Shoulder Pain    right    Lugene Kielbasa is a 63 y.o.  male for evaluation and treatment of neck pain.   Onset:  10 days ago. Slept on it wrong Location: R side of neck in back Character:  aching  Progression of issue:  is unchanged Associated symptoms: full ROM painful Treatment: to date has been ice, OTC NSAIDS, acetaminophen, muscle relaxers and heat.   Neurovascular symptoms: no  ROS: Musculoskeletal/Extremities: +neck pain  Past Medical History:  Diagnosis Date  . 10 year risk of MI or stroke 7.5% or greater 01/20/2016  . 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 Cancer ([place]) [id number]   skin cancer  . DDD (degenerative disc disease), lumbar   . Depression 01/14/2016  . Erectile dysfunction   . Essential hypertension 01/14/2016  . History of TIA (transient ischemic attack)    per pt/ unsure if had TIA   . Hyperlipidemia   . Pericarditis 1986  . Tubular adenoma of colon    02/17/10    Objective: VITAL SIGNS: BP 130/82 (BP Location: Right Arm, Patient Position: Sitting, Cuff Size: Large)   Pulse 80   Temp (!) 96.5 F (35.8 C) (Temporal)   Ht 6\' 1"  (1.854 m)   Wt 278 lb (126.1 kg)   SpO2 98%   BMI 36.68 kg/m  Constitutional: Well formed, well developed. No acute distress. Cardiovascular: Brisk cap refill Thorax & Lungs: No accessory muscle use Musculoskeletal: neck.   Normal active range of motion: no.   Normal passive range of motion: no, mildly decreased R rotation Tenderness to palpation: mild midline Deformity: no Ecchymosis: no Tests positive: none Tests negative: Spurling's Neurologic: Normal sensory function. No focal deficits noted. DTR's equal and symmetric in UE's. No clonus. Psychiatric: Normal mood. Age appropriate judgment and insight. Alert & oriented x  3.    Assessment:  Neck pain - Plan: predniSONE (DELTASONE) 20 MG tablet, tiZANidine (ZANAFLEX) 4 MG tablet, ketorolac (TORADOL) injection 60 mg  Plan: Toradol today. Pred burst tomorrow. Sub Flexeril w Zanaflex.  Heat, ice, Tylenol, stretches exercises. F/u prn. The patient voiced understanding and agreement to the plan.   [place], DO 04/04/19  2:41 PM

## 2019-04-04 NOTE — Patient Instructions (Addendum)
OK to take Tylenol 1000 mg (2 extra strength tabs) or 975 mg (3 regular strength tabs) every 6 hours as needed.  Ice/cold pack over area for 10-15 min twice daily.  Heat (pad or rice pillow in microwave) over affected area, 10-15 minutes twice daily.   Don't take NSAIDs while on the steroid.  Don't take Flexeril while on the tizanidine.   EXERCISES RANGE OF MOTION (ROM) AND STRETCHING EXERCISES  These exercises may help you when beginning to rehabilitate your issue. In order to successfully resolve your symptoms, you must improve your posture. These exercises are designed to help reduce the forward-head and rounded-shoulder posture which contributes to this condition. Your symptoms may resolve with or without further involvement from your physician, physical therapist or athletic trainer. While completing these exercises, remember:   Restoring tissue flexibility helps normal motion to return to the joints. This allows healthier, less painful movement and activity.  An effective stretch should be held for at least 20 seconds, although you may need to begin with shorter hold times for comfort.  A stretch should never be painful. You should only feel a gentle lengthening or release in the stretched tissue.  Do not do any stretch or exercise that you cannot tolerate.  STRETCH- Axial Extensors  Lie on your back on the floor. You may bend your knees for comfort. Place a rolled-up hand towel or dish towel, about 2 inches in diameter, under the part of your head that makes contact with the floor.  Gently tuck your chin, as if trying to make a "double chin," until you feel a gentle stretch at the base of your head.  Hold 15-20 seconds. Repeat 2-3 times. Complete this exercise 1 time per day.   STRETCH - Axial Extension   Stand or sit on a firm surface. Assume a good posture: chest up, shoulders drawn back, abdominal muscles slightly tense, knees unlocked (if standing) and feet hip width  apart.  Slowly retract your chin so your head slides back and your chin slightly lowers. Continue to look straight ahead.  You should feel a gentle stretch in the back of your head. Be certain not to feel an aggressive stretch since this can cause headaches later.  Hold for 15-20 seconds. Repeat 2-3 times. Complete this exercise 1 time per day.  STRETCH - Cervical Side Bend   Stand or sit on a firm surface. Assume a good posture: chest up, shoulders drawn back, abdominal muscles slightly tense, knees unlocked (if standing) and feet hip width apart.  Without letting your nose or shoulders move, slowly tip your right / left ear to your shoulder until your feel a gentle stretch in the muscles on the opposite side of your neck.  Hold 15-20 seconds. Repeat 2-3 times. Complete this exercise 1-2 times per day.  STRETCH - Cervical Rotators   Stand or sit on a firm surface. Assume a good posture: chest up, shoulders drawn back, abdominal muscles slightly tense, knees unlocked (if standing) and feet hip width apart.  Keeping your eyes level with the ground, slowly turn your head until you feel a gentle stretch along the back and opposite side of your neck.  Hold 15-20 seconds. Repeat 2-3 times. Complete this exercise 1-2 times per day.  RANGE OF MOTION - Neck Circles   Stand or sit on a firm surface. Assume a good posture: chest up, shoulders drawn back, abdominal muscles slightly tense, knees unlocked (if standing) and feet hip width apart.  Gently roll  your head down and around from the back of one shoulder to the back of the other. The motion should never be forced or painful.  Repeat the motion 10-20 times, or until you feel the neck muscles relax and loosen. Repeat 2-3 times. Complete the exercise 1-2 times per day. STRENGTHENING EXERCISES - Cervical Strain and Sprain These exercises may help you when beginning to rehabilitate your injury. They may resolve your symptoms with or without  further involvement from your physician, physical therapist, or athletic trainer. While completing these exercises, remember:   Muscles can gain both the endurance and the strength needed for everyday activities through controlled exercises.  Complete these exercises as instructed by your physician, physical therapist, or athletic trainer. Progress the resistance and repetitions only as guided.  You may experience muscle soreness or fatigue, but the pain or discomfort you are trying to eliminate should never worsen during these exercises. If this pain does worsen, stop and make certain you are following the directions exactly. If the pain is still present after adjustments, discontinue the exercise until you can discuss the trouble with your clinician.  STRENGTH - Cervical Flexors, Isometric  Face a wall, standing about 6 inches away. Place a small pillow, a ball about 6-8 inches in diameter, or a folded towel between your forehead and the wall.  Slightly tuck your chin and gently push your forehead into the soft object. Push only with mild to moderate intensity, building up tension gradually. Keep your jaw and forehead relaxed.  Hold 10 to 20 seconds. Keep your breathing relaxed.  Release the tension slowly. Relax your neck muscles completely before you start the next repetition. Repeat 2-3 times. Complete this exercise 1 time per day.  STRENGTH- Cervical Lateral Flexors, Isometric   Stand about 6 inches away from a wall. Place a small pillow, a ball about 6-8 inches in diameter, or a folded towel between the side of your head and the wall.  Slightly tuck your chin and gently tilt your head into the soft object. Push only with mild to moderate intensity, building up tension gradually. Keep your jaw and forehead relaxed.  Hold 10 to 20 seconds. Keep your breathing relaxed.  Release the tension slowly. Relax your neck muscles completely before you start the next repetition. Repeat 2-3  times. Complete this exercise 1 time per day.  STRENGTH - Cervical Extensors, Isometric   Stand about 6 inches away from a wall. Place a small pillow, a ball about 6-8 inches in diameter, or a folded towel between the back of your head and the wall.  Slightly tuck your chin and gently tilt your head back into the soft object. Push only with mild to moderate intensity, building up tension gradually. Keep your jaw and forehead relaxed.  Hold 10 to 20 seconds. Keep your breathing relaxed.  Release the tension slowly. Relax your neck muscles completely before you start the next repetition. Repeat 2-3 times. Complete this exercise 1 time per day.  POSTURE AND BODY MECHANICS CONSIDERATIONS Keeping correct posture when sitting, standing or completing your activities will reduce the stress put on different body tissues, allowing injured tissues a chance to heal and limiting painful experiences. The following are general guidelines for improved posture. Your physician or physical therapist will provide you with any instructions specific to your needs. While reading these guidelines, remember:  The exercises prescribed by your provider will help you have the flexibility and strength to maintain correct postures.  The correct posture provides the  optimal environment for your joints to work. All of your joints have less wear and tear when properly supported by a spine with good posture. This means you will experience a healthier, less painful body.  Correct posture must be practiced with all of your activities, especially prolonged sitting and standing. Correct posture is as important when doing repetitive low-stress activities (typing) as it is when doing a single heavy-load activity (lifting).  PROLONGED STANDING WHILE SLIGHTLY LEANING FORWARD When completing a task that requires you to lean forward while standing in one place for a long time, place either foot up on a stationary 2- to 4-inch high  object to help maintain the best posture. When both feet are on the ground, the low back tends to lose its slight inward curve. If this curve flattens (or becomes too large), then the back and your other joints will experience too much stress, fatigue more quickly, and can cause pain.   RESTING POSITIONS Consider which positions are most painful for you when choosing a resting position. If you have pain with flexion-based activities (sitting, bending, stooping, squatting), choose a position that allows you to rest in a less flexed posture. You would want to avoid curling into a fetal position on your side. If your pain worsens with extension-based activities (prolonged standing, working overhead), avoid resting in an extended position such as sleeping on your stomach. Most people will find more comfort when they rest with their spine in a more neutral position, neither too rounded nor too arched. Lying on a non-sagging bed on your side with a pillow between your knees, or on your back with a pillow under your knees will often provide some relief. Keep in mind, being in any one position for a prolonged period of time, no matter how correct your posture, can still lead to stiffness.  WALKING Walk with an upright posture. Your ears, shoulders, and hips should all line up. OFFICE WORK When working at a desk, create an environment that supports good, upright posture. Without extra support, muscles fatigue and lead to excessive strain on joints and other tissues.  CHAIR:  A chair should be able to slide under your desk when your back makes contact with the back of the chair. This allows you to work closely.  The chair's height should allow your eyes to be level with the upper part of your monitor and your hands to be slightly lower than your elbows.  Body position: ? Your feet should make contact with the floor. If this is not possible, use a foot rest. ? Keep your ears over your shoulders. This will  reduce stress on your neck and low back.

## 2019-04-08 ENCOUNTER — Telehealth: Payer: Self-pay | Admitting: Family Medicine

## 2019-04-08 DIAGNOSIS — M542 Cervicalgia: Secondary | ICD-10-CM

## 2019-04-08 MED ORDER — PREDNISONE 20 MG PO TABS: 40.0000 mg | ORAL_TABLET | Freq: Every day | ORAL | 0 refills | Status: AC

## 2019-04-08 MED ORDER — TIZANIDINE HCL 4 MG PO TABS: 4.0000 mg | ORAL_TABLET | Freq: Four times a day (QID) | ORAL | 0 refills | Status: DC | PRN

## 2019-04-08 NOTE — Telephone Encounter (Signed)
Pt states that he will run out of predniSONE (DELTASONE) 20 MG tablet today and will run out of tiZANidine (ZANAFLEX) 4 MG tablet on Thursday. Pt states that it is helping but he is not fully better yet.  Pt wants to know if Dr. Nani Ravens would be willing to give him another few days to a weeks supply to see if he can fully recover. Pt uses     [id number] - [place], [place] - [address] AT [place] [address] [phone] (Phone) [phone] (Fax)

## 2019-04-08 NOTE — Telephone Encounter (Signed)
Refilled the muscle relaxer/called the patient informed (left msg) refills done

## 2019-04-08 NOTE — Telephone Encounter (Signed)
OK to refill Zanaflex, I will send in 3 more days of prednisone. Ty.

## 2019-04-08 NOTE — Addendum Note (Signed)
Addended by: Sharon Seller B on: 04/08/2019 04:42 PM   Modules accepted: Orders

## 2019-04-14 ENCOUNTER — Encounter: Payer: Self-pay | Admitting: Family Medicine

## 2019-04-14 ENCOUNTER — Other Ambulatory Visit: Payer: Self-pay

## 2019-04-14 ENCOUNTER — Ambulatory Visit: Payer: Self-pay

## 2019-04-14 ENCOUNTER — Ambulatory Visit (INDEPENDENT_AMBULATORY_CARE_PROVIDER_SITE_OTHER): Payer: BC Managed Care – PPO | Admitting: Family Medicine

## 2019-04-14 VITALS — BP 108/68 | HR 85 | Temp 97.0°F | Ht 73.0 in | Wt 282.5 lb

## 2019-04-14 DIAGNOSIS — M542 Cervicalgia: Secondary | ICD-10-CM | POA: Diagnosis not present

## 2019-04-14 MED ORDER — GABAPENTIN 400 MG PO CAPS: 400.0000 mg | ORAL_CAPSULE | Freq: Every day | ORAL | 0 refills | Status: DC

## 2019-04-14 MED ORDER — INDOMETHACIN 50 MG PO CAPS: 50.0000 mg | ORAL_CAPSULE | Freq: Three times a day (TID) | ORAL | 0 refills | Status: DC

## 2019-04-14 NOTE — Patient Instructions (Signed)
Continue the exercises.  Heat (pad or rice pillow in microwave) over affected area, 10-15 minutes twice daily.   OK to take Tylenol 1000 mg (2 extra strength tabs) or 975 mg (3 regular strength tabs) every 6 hours as needed.  Let me know next week if no better.  Let us know if you need anything.

## 2019-04-14 NOTE — Telephone Encounter (Signed)
Pt c/o intermittent bilateral arm numbness that worsens after recumbent.  Yest pm both hands numb. Pt stated he still has strength.  Positional changes does help the sensation and pain decreases. But pain comes back.  Pt stated h/o pericarditis and pt stated he couldn't lay flat.  Last night c/o lightheadedness and  I concerned that it could be pericarditis.  Last night with throbbing behind neck and shoulder. Tried heat and ice but pain went away briefly. Pain down right arm and shoulder and when pain increases to side of neck and beside ear- feels like pian increases and now goes to clavicle are. Left side numbness only to left arm. Denies chest pain but feels weakness. Eyes and tongue and lips feel "amped up" irritated.  Pt states he has been having increase in stress and not sleeping as well. Spoke with Christy at office and call transferred to set up appt.  Reason for Disposition . [1] Numbness (i.e., loss of sensation) of the face, arm / hand, or leg / foot on one side of the body AND [2] gradual onset (e.g., days to weeks) AND [3] present now  Answer Assessment - Initial Assessment Questions 1. SYMPTOM: "What is the main symptom you are concerned about?" (e.g., weakness, numbness)     Numbness and tingling to both arms and pain to neck 2. ONSET: "When did this start?" (minutes, hours, days; while sleeping)     3 weeks 3. LAST NORMAL: "When was the last time you were normal (no symptoms)?"     Before 3 weeks ago 4. PATTERN "Does this come and go, or has it been constant since it started?"  "Is it present now?"     Comes and goes- is present but eases off  5. CARDIAC SYMPTOMS: "Have you had any of the following symptoms: chest pain, difficulty breathing, palpitations?"     Occasional SOB when in pain and thinks it is from anxiety due to pain 6. NEUROLOGIC SYMPTOMS: "Have you had any of the following symptoms: headache, dizziness, vision loss, double vision, changes in speech, unsteady on  your feet?"     Unsteady when feeling lightheaded- has had mild headache last on was this am 7. OTHER SYMPTOMS: "Do you have any other symptoms?"     Periodic numbness to left arm goes way with positional changes- sudden urges to go to [place] 8. PREGNANCY: "Is there any chance you are pregnant?" "When was your last menstrual period?"     n/a  Protocols used: NEUROLOGIC DEFICIT-A-AH

## 2019-04-14 NOTE — Progress Notes (Signed)
Chief Complaint  Patient presents with  . Neck Pain  . Shoulder Pain  . Insomnia    Subjective:2 Patient is a 63 y.o. male here for f/u neck pain.  Tx'd for neck pain on 11/13. Tx'd w pred and msc relaxer. Did well initially. Started to get worse over past few days. Felt dry mouth and sob intermittently. Having some burning pain over the RUE. Notes he had pericarditis in past with similar s/s's. He has been using OTC NSAID's a lot.   ROS: MSK: +neck pain  Past Medical History:  Diagnosis Date  . 10 year risk of MI or stroke 7.5% or greater 01/20/2016  . 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 Cancer ([facility]) [id number]   skin cancer  . DDD (degenerative disc disease), lumbar   . Depression 01/14/2016  . Erectile dysfunction   . Essential hypertension 01/14/2016  . History of TIA (transient ischemic attack)    per pt/ unsure if had TIA   . Hyperlipidemia   . Pericarditis 1986  . Tubular adenoma of colon    02/17/10   Objective: BP 108/68 (BP Location: Left Arm, Patient Position: Sitting, Cuff Size: Large)   Pulse 85   Temp (!) 97 F (36.1 C) (Temporal)   Ht 6\' 1"  (1.854 m)   Wt 282 lb 8 oz (128.1 kg)   SpO2 98%   BMI 37.27 kg/m  General: Awake, appears stated age MSK: Mild ttp over lateral neck, no ttp midline or parasp msc, nml ROM Neuro: Strength 5/5 in UE's throughout, DTR's 2/4 biceps b/l wo clonus, sensation intact to light touch. Neg Spurling's.  Heart: RRR Lungs: CTAB, no rales, wheezes or rhonchi. No accessory muscle use Psych: Age appropriate judgment and insight, normal affect and mood  Assessment and Plan: Neck pain - Plan: indomethacin (INDOCIN) 50 MG capsule, gabapentin (NEURONTIN) 400 MG capsule  Start gabapentin and Indomethacin TID. If not improving by next week, will set up with PT vs MRI. Fu prn.  The patient voiced understanding and agreement to the plan.  [place], DO 04/14/19  4:39 PM

## 2019-04-16 ENCOUNTER — Ambulatory Visit: Payer: BC Managed Care – PPO | Admitting: Family Medicine

## 2019-04-24 ENCOUNTER — Encounter: Payer: Self-pay | Admitting: Family Medicine

## 2019-04-25 ENCOUNTER — Other Ambulatory Visit: Payer: Self-pay | Admitting: Family Medicine

## 2019-04-25 DIAGNOSIS — M542 Cervicalgia: Secondary | ICD-10-CM

## 2019-04-25 MED ORDER — INDOMETHACIN 50 MG PO CAPS: 50.0000 mg | ORAL_CAPSULE | Freq: Three times a day (TID) | ORAL | 0 refills | Status: AC

## 2019-06-13 ENCOUNTER — Ambulatory Visit (INDEPENDENT_AMBULATORY_CARE_PROVIDER_SITE_OTHER): Payer: BC Managed Care – PPO | Admitting: Family Medicine

## 2019-06-13 ENCOUNTER — Other Ambulatory Visit: Payer: Self-pay

## 2019-06-13 ENCOUNTER — Encounter: Payer: Self-pay | Admitting: Family Medicine

## 2019-06-13 DIAGNOSIS — M542 Cervicalgia: Secondary | ICD-10-CM | POA: Diagnosis not present

## 2019-06-13 MED ORDER — GABAPENTIN 600 MG PO TABS: 600.0000 mg | ORAL_TABLET | Freq: Every day | ORAL | 0 refills | Status: DC

## 2019-06-13 MED ORDER — PREDNISONE 20 MG PO TABS: 40.0000 mg | ORAL_TABLET | Freq: Every day | ORAL | 0 refills | Status: DC

## 2019-06-13 NOTE — Progress Notes (Signed)
Musculoskeletal Exam  Patient: Javier King DOB: 05-23-1955  DOS: 06/13/2019  SUBJECTIVE:  Chief Complaint:   Chief Complaint  Patient presents with  . Neck Pain    disrupting sleep   . Shoulder Pain    Javier King is a 64 y.o.  male for evaluation and treatment of neck/shoulder pain. Due to COVID-19 pandemic, we are interacting via web portal for an electronic face-to-face visit. I verified patient's ID using 2 identifiers. Patient agreed to proceed with visit via this method. Patient is at home, I am at office. Patient and I are present for visit.   Onset:  2 weeks ago. No inj or change in activity.  Location: R side of neck/shoulder area Had same issue where he failed prednisone and Zanaflex. PT was not helpful. Seemed to resolve w indomethacin. Tried that this time and it did help. Pred helped last time but s/s's resumed.  Character:  aching and boring  Progression of issue:  has worsened Associated symptoms: radiating down R upper ext.  Neurovascular symptoms: +burning/tingling  ROS: Musculoskeletal/Extremities: +neck pain  Past Medical History:  Diagnosis Date  . 10 year risk of MI or stroke 7.5% or greater 01/20/2016  . 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 Cancer ([facility]) [id number]   skin cancer  . DDD (degenerative disc disease), lumbar   . Depression 01/14/2016  . Erectile dysfunction   . Essential hypertension 01/14/2016  . History of TIA (transient ischemic attack)    per pt/ unsure if had TIA   . Hyperlipidemia   . Pericarditis 1986  . Tubular adenoma of colon    02/17/10    Objective: No conversational dyspnea Age appropriate judgment and insight Nml affect and mood  Assessment:  Neck pain - Plan: DG Cervical Spine Complete, predniSONE (DELTASONE) 20 MG tablet, gabapentin (NEURONTIN) 600 MG tablet  Plan: This is a recurrent issue w no clear cause. He has gone through PO therapy, home stretches/exercises, PT. Will restart pred burst and  cont Flexeril. Neurontin for bedtime use. Ck neck film, if neg and he is not improving, will order MRI.  F/u prn. The patient voiced understanding and agreement to the plan.   [place], DO 06/13/19  11:01 AM

## 2019-06-18 ENCOUNTER — Other Ambulatory Visit: Payer: Self-pay

## 2019-06-18 ENCOUNTER — Encounter: Payer: Self-pay | Admitting: Internal Medicine

## 2019-06-18 ENCOUNTER — Ambulatory Visit (INDEPENDENT_AMBULATORY_CARE_PROVIDER_SITE_OTHER): Payer: BC Managed Care – PPO | Admitting: Internal Medicine

## 2019-06-18 VITALS — BP 121/66 | HR 61 | Temp 95.1°F | Resp 16 | Ht 73.0 in | Wt 288.0 lb

## 2019-06-18 DIAGNOSIS — I4891 Unspecified atrial fibrillation: Secondary | ICD-10-CM

## 2019-06-18 DIAGNOSIS — R12 Heartburn: Secondary | ICD-10-CM

## 2019-06-18 DIAGNOSIS — I499 Cardiac arrhythmia, unspecified: Secondary | ICD-10-CM

## 2019-06-18 DIAGNOSIS — M542 Cervicalgia: Secondary | ICD-10-CM | POA: Diagnosis not present

## 2019-06-18 MED ORDER — PANTOPRAZOLE SODIUM 40 MG PO TBEC: 40.0000 mg | DELAYED_RELEASE_TABLET | Freq: Every day | ORAL | 1 refills | Status: DC

## 2019-06-18 MED ORDER — METOPROLOL TARTRATE 25 MG PO TABS: 25.0000 mg | ORAL_TABLET | Freq: Two times a day (BID) | ORAL | 1 refills | Status: DC

## 2019-06-18 MED ORDER — HYDROCODONE-ACETAMINOPHEN 5-325 MG PO TABS: 1.0000 | ORAL_TABLET | Freq: Every evening | ORAL | 0 refills | Status: DC | PRN

## 2019-06-18 NOTE — Progress Notes (Signed)
Subjective:    Patient ID: Javier King, male    DOB: July 20, 1955, 64 y.o.   MRN: [id number]  DOS:  06/18/2019 Type of visit - description: Acute His main concern is neck pain, right-sided.    Was seen by PCP 3 times since November with the same problem. The first round of prednisone did help. He was seen again 06/13/2019, virtually,   x-rays were prescribed at (not done) , he was prescribed prednisone again, this time it did not help. He also was prescribed gabapentin and he does not think is helping much.  Pain started around November 2020, denies any fall or injury. Is located at the right side of the nuchal area, trapezoid area, shoulder and extends to the arm. He is worse at night when he lays down in bed, decreases when he sits in a recliner. Increased with moving his head but not by moving his shoulders. Denies any motor deficits. No lower extremity paresthesias, gait is normal.  On exam I noticed his heart to be somewhat irregular. Denies chest pain no difficulty breathing. He does have mild lower extremity edema which is a chronic issue. He admits to palpitations today. Also heartburn.     Review of Systems See above   Past Medical History:  Diagnosis Date  . 10 year risk of MI or stroke 7.5% or greater 01/20/2016  . 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 Cancer ([place]) [id number]   skin cancer  . DDD (degenerative disc disease), lumbar   . Depression 01/14/2016  . Erectile dysfunction   . Essential hypertension 01/14/2016  . History of TIA (transient ischemic attack)    per pt/ unsure if had TIA   . Hyperlipidemia   . Pericarditis 1986  . Tubular adenoma of colon    02/17/10    Past Surgical History:  Procedure Laterality Date  . COLONOSCOPY    . OTHER SURGICAL HISTORY  2003,2004   Back surgery/ 2 laminectomies in lower back  . TONSILLECTOMY          Objective:   Physical Exam BP 121/66 (BP Location: Left Arm, Patient Position: Sitting,  Cuff Size: Normal)   Pulse 61   Temp (!) 95.1 F (35.1 C) (Temporal)   Resp 16   Ht 6\' 1"  (1.854 m)   Wt 288 lb (130.6 kg)   SpO2 98%   BMI 38.00 kg/m  General:   Well developed, NAD, BMI noted. HEENT:  Normocephalic . Face symmetric, atraumatic Neck: No TTP at the cervical spine. Range of motion: Slightly limited when he turns to the right due to pain but otherwise normal Lungs:  CTA B Normal respiratory effort, no intercostal retractions, no accessory muscle use. Heart: Irregularly irregular,  no murmur.  No pretibial edema bilaterally  Skin: Not pale. Not jaundice Neurologic:  alert & oriented X3.  Speech normal, gait appropriate for age and unassisted Motor and DTRs symmetric Psych--  Cognition and judgment appear intact.  Cooperative with normal attention span and concentration.  Behavior appropriate. No anxious or depressed appearing.      Assessment     64 year old gentleman, PMH includes DJD, HTN, anxiety depression, history of 2 previous back surgeries, presents with:  Neck pain: Currently on gabapentin at night which is not helping much, also take Flexeril as needed. He had a second round of prednisone but this time it did not help. Neuro exam is nonfocal, plan is to refer him to Ortho (after he finished with cardiology, see next). Addendum:  Request some help, pain is pretty significant at night, sent hydrocodone as needed, drowsiness discussed  A. fib: Today I noticed irregular heartbeat, EKG confirms atrial fibrillation. No history of such diagnosis. He is 64 years old, hypertension, no diabetes, last BMP and TSH normal. Plan discussed with cardiology: Referral to cardiology sent. Start aspirin Metoprolol 25 twice daily, check BPs CMP, CBC, repeat TSH ER if chest pain, palpitations, difficulty breathing  Heartburn: The patient describes burning at the throat, start pantoprazole.  I don't  suspect his heartburn is a  angina equivalent.  Nevertheless if  he is not improving on pantoprazole he is to let us know  RTC to see PCP 1 month  Time spent 45 minutes assessing a all issue on 2 new issues  This visit occurred during the SARS-CoV-2 public health emergency.  Safety protocols were in place, including screening questions prior to the visit, additional usage of staff PPE, and extensive cleaning of exam room while observing appropriate contact time as indicated for disinfecting solutions.

## 2019-06-18 NOTE — Patient Instructions (Addendum)
GO TO THE LAB : Get the blood work     GO TO THE FRONT DESK Schedule your next appointment   to see your primary doctor in 1 month  Cardiology will call you  Start aspirin 81 mg OTC 1 tablet daily  Start metoprolol 25 mg 1 tablet twice a day  Start pantoprazole 1 tablet daily before breakfast for heartburn  Check your blood pressure twice a week if possible BP GOAL is between 110/65 and  135/85. If it is consistently higher or lower, let me know   Atrial Fibrillation  Atrial fibrillation is a type of heartbeat that is irregular or fast. If you have this condition, your heart beats without any order. This makes it hard for your heart to pump blood in a normal way. Atrial fibrillation may come and go, or it may become a long-lasting problem. If this condition is not treated, it can put you at higher risk for stroke, heart failure, and other heart problems. What are the causes? This condition may be caused by diseases that damage the heart. They include:  High blood pressure.  Heart failure.  Heart valve disease.  Heart surgery. Other causes include:  Diabetes.  Thyroid disease.  Being overweight.  Kidney disease. Sometimes the cause is not known. What increases the risk? You are more likely to develop this condition if:  You are older.  You smoke.  You exercise often and very hard.  You have a family history of this condition.  You are a man.  You use drugs.  You drink a lot of alcohol.  You have lung conditions, such as emphysema, pneumonia, or COPD.  You have sleep apnea. What are the signs or symptoms? Common symptoms of this condition include:  A feeling that your heart is beating very fast.  Chest pain or discomfort.  Feeling short of breath.  Suddenly feeling light-headed or weak.  Getting tired easily during activity.  Fainting.  Sweating. In some cases, there are no symptoms. How is this treated? Treatment for this condition  depends on underlying conditions and how you feel when you have atrial fibrillation. They include:  Medicines to: ? Prevent blood clots. ? Treat heart rate or heart rhythm problems.  Using devices, such as a pacemaker, to correct heart rhythm problems.  Doing surgery to remove the part of the heart that sends bad signals.  Closing an area where clots can form in the heart (left atrial appendage). In some cases, your doctor will treat other underlying conditions. Follow these instructions at home: Medicines  Take over-the-counter and prescription medicines only as told by your doctor.  Do not take any new medicines without first talking to your doctor.  If you are taking blood thinners: ? Talk with your doctor before you take any medicines that have aspirin or NSAIDs, such as ibuprofen, in them. ? Take your medicine exactly as told by your doctor. Take it at the same time each day. ? Avoid activities that could hurt or bruise you. Follow instructions about how to prevent falls. ? Wear a bracelet that says you are taking blood thinners. Or, carry a card that lists what medicines you take. Lifestyle      Do not use any products that have nicotine or tobacco in them. These include cigarettes, e-cigarettes, and chewing tobacco. If you need help quitting, ask your doctor.  Eat heart-healthy foods. Talk with your doctor about the right eating plan for you.  Exercise regularly as told by  your doctor.  Do not drink alcohol.  Lose weight if you are overweight.  Do not use drugs, including cannabis. General instructions  If you have a condition that causes breathing to stop for a short period of time (apnea), treat it as told by your doctor.  Keep a healthy weight. Do not use diet pills unless your doctor says they are safe for you. Diet pills may make heart problems worse.  Keep all follow-up visits as told by your doctor. This is important. Contact a doctor if:  You notice a  change in the speed, rhythm, or strength of your heartbeat.  You are taking a blood-thinning medicine and you get more bruising.  You get tired more easily when you move or exercise.  You have a sudden change in weight. Get help right away if:   You have pain in your chest or your belly (abdomen).  You have trouble breathing.  You have side effects of blood thinners, such as blood in your vomit, poop (stool), or pee (urine), or bleeding that cannot stop.  You have any signs of a stroke. "BE FAST" is an easy way to remember the main warning signs: ? B - Balance. Signs are dizziness, sudden trouble walking, or loss of balance. ? E - Eyes. Signs are trouble seeing or a change in how you see. ? F - Face. Signs are sudden weakness or loss of feeling in the face, or the face or eyelid drooping on one side. ? A - Arms. Signs are weakness or loss of feeling in an arm. This happens suddenly and usually on one side of the body. ? S - Speech. Signs are sudden trouble speaking, slurred speech, or trouble understanding what people say. ? T - Time. Time to call emergency services. Write down what time symptoms started.  You have other signs of a stroke, such as: ? A sudden, very bad headache with no known cause. ? Feeling like you may vomit (nausea). ? Vomiting. ? A seizure. These symptoms may be an emergency. Do not wait to see if the symptoms will go away. Get medical help right away. Call your local emergency services (911 in the [place] Do not drive yourself to the hospital. Summary  Atrial fibrillation is a type of heartbeat that is irregular or fast.  You are at higher risk of this condition if you smoke, are older, have diabetes, or are overweight.  Follow your doctor's instructions about medicines, diet, exercise, and follow-up visits.  Get help right away if you have signs or symptoms of a stroke.  Get help right away if you cannot catch your breath, or you have chest pain or  discomfort. This information is not intended to replace advice given to you by your health care provider. Make sure you discuss any questions you have with your health care provider. Document Revised: 10/30/2018 Document Reviewed: 10/30/2018 Elsevier Patient Education  [place].

## 2019-06-18 NOTE — Progress Notes (Signed)
Pre visit review using our clinic review tool, if applicable. No additional management support is needed unless otherwise documented below in the visit note. 

## 2019-06-19 ENCOUNTER — Telehealth: Payer: Self-pay

## 2019-06-19 LAB — CBC WITH DIFFERENTIAL/PLATELET
Basophils Absolute: 0 10*3/uL (ref 0.0–0.1)
Basophils Relative: 0.2 % (ref 0.0–3.0)
Eosinophils Absolute: 0 10*3/uL (ref 0.0–0.7)
Eosinophils Relative: 0 % (ref 0.0–5.0)
HCT: 46.7 % (ref 39.0–52.0)
Hemoglobin: 15.8 g/dL (ref 13.0–17.0)
Lymphocytes Relative: 6 % — ABNORMAL LOW (ref 12.0–46.0)
Lymphs Abs: 0.9 10*3/uL (ref 0.7–4.0)
MCHC: 33.9 g/dL (ref 30.0–36.0)
MCV: 93.2 fl (ref 78.0–100.0)
Monocytes Absolute: 0.9 10*3/uL (ref 0.1–1.0)
Monocytes Relative: 6.3 % (ref 3.0–12.0)
Neutro Abs: 13.2 10*3/uL — ABNORMAL HIGH (ref 1.4–7.7)
Neutrophils Relative %: 87.5 % — ABNORMAL HIGH (ref 43.0–77.0)
Platelets: 301 10*3/uL (ref 150.0–400.0)
RBC: 5.01 Mil/uL (ref 4.22–5.81)
RDW: 13 % (ref 11.5–15.5)
WBC: 15 10*3/uL — ABNORMAL HIGH (ref 4.0–10.5)

## 2019-06-19 LAB — COMPREHENSIVE METABOLIC PANEL
ALT: 38 U/L (ref 0–53)
AST: 21 U/L (ref 0–37)
Albumin: 4.2 g/dL (ref 3.5–5.2)
Alkaline Phosphatase: 47 U/L (ref 39–117)
BUN: 26 mg/dL — ABNORMAL HIGH (ref 6–23)
CO2: 29 mEq/L (ref 19–32)
Calcium: 9.8 mg/dL (ref 8.4–10.5)
Chloride: 91 mEq/L — ABNORMAL LOW (ref 96–112)
Creatinine, Ser: 1.17 mg/dL (ref 0.40–1.50)
GFR: 63.3 mL/min (ref >60.00)
Glucose, Bld: 103 mg/dL — ABNORMAL HIGH (ref 70–99)
Potassium: 4.5 mEq/L (ref 3.5–5.1)
Sodium: 128 mEq/L — ABNORMAL LOW (ref 135–145)
Total Bilirubin: 0.7 mg/dL (ref 0.2–1.2)
Total Protein: 6.9 g/dL (ref 6.0–8.3)

## 2019-06-19 LAB — TSH: TSH: 1.35 u[IU]/mL (ref 0.35–4.50)

## 2019-06-19 NOTE — Telephone Encounter (Signed)
Message left for patient to make a new patient appointment with any available MD at [facility].

## 2019-06-19 NOTE — Progress Notes (Signed)
Cardiology Office Note:    Date:  06/20/2019   ID:  Javier King, DOB 12-Mar-1956, MRN [id number]  PCP:  Shelda Pal, DO  Cardiologist:  Elouise Munroe, MD  Electrophysiologist:  None   Referring MD: Shelda Pal*   Chief Complaint: AFIB  History of Present Illness:    Javier King is a 64 y.o. male with a hx of anxiety and depression, musculoskeletal pain, HTN, possible TIA, HLD, pericarditis who presents for incidentally noted atrial fibrillation.   He presented to his primary care doctor's office with musculoskeletal pain in his neck and shoulder and on physical exam was noted to have an irregular heart rhythm.  He was found to have incidentally noted atrial fibrillation.  He tells me that 9 years ago during a work-up for possible TIA atrial fibrillation was mentioned.  He was working on a hot day and afterward was noted to have trouble articulating words.  He did feel a fluttering in his chest at that time.  He feels he was prescribed Plavix at that time but stopped 7 or 8 years ago.  He also has a history of pericarditis with pericardial effusion in 1985 requiring pericardiocentesis per his report.  His musculoskeletal pain is described as a pain in the neck that radiates to his right arm.  He was prescribed prednisone but he does not feel that it helped significantly.  He feels this is all related to lack of sleep.  He denies significant current palpitations, denies chest pain, denies significant exertional shortness of breath.  Has mild chronic lower extremity edema.  Past Medical History:  Diagnosis Date  . 10 year risk of MI or stroke 7.5% or greater 01/20/2016  . 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 Cancer ([place]) [id number]   skin cancer  . DDD (degenerative disc disease), lumbar   . Depression 01/14/2016  . Erectile dysfunction   . Essential hypertension 01/14/2016  . History of TIA (transient ischemic attack)    per pt/ unsure if  had TIA   . Hyperlipidemia   . Pericarditis 1986  . Tubular adenoma of colon    02/17/10    Past Surgical History:  Procedure Laterality Date  . COLONOSCOPY    . OTHER SURGICAL HISTORY  2003,2004   Back surgery/ 2 laminectomies in lower back  . TONSILLECTOMY      Current Medications: Current Meds  Medication Sig  . aspirin EC 81 MG tablet Take 81 mg by mouth daily.  . chlorthalidone (HYGROTON) 25 MG tablet TAKE 1 TABLET DAILY  . cyclobenzaprine (FLEXERIL) 5 MG tablet TAKE 1 TABLET TWICE A DAY AS NEEDED  . lisinopril (PRINIVIL,ZESTRIL) 40 MG tablet TAKE 1 TABLET DAILY  . tadalafil (CIALIS) 20 MG tablet Take 0.5-1 tablets (10-20 mg total) by mouth every other day as needed for erectile dysfunction.  Marland Kitchen zolpidem (AMBIEN) 5 MG tablet Take 1 tablet (5 mg total) by mouth at bedtime as needed for sleep.  . [DISCONTINUED] HYDROcodone-acetaminophen (NORCO/VICODIN) 5-325 MG tablet Take 1-2 tablets by mouth at bedtime as needed.  . [DISCONTINUED] metoprolol tartrate (LOPRESSOR) 25 MG tablet Take 1 tablet (25 mg total) by mouth 2 (two) times daily.  . [DISCONTINUED] pantoprazole (PROTONIX) 40 MG tablet Take 1 tablet (40 mg total) by mouth daily before breakfast.  . [DISCONTINUED] traZODone (DESYREL) 100 MG tablet TAKE 1 TABLET AT BEDTIME     Allergies:   Penicillins   Social History   Socioeconomic History  . Marital status: Divorced  Spouse name: Not on file  . Number of children: Not on file  . Years of education: Not on file  . Highest education level: Not on file  Occupational History  . Not on file  Tobacco Use  . Smoking status: Never Smoker  . Smokeless tobacco: Never Used  Substance and Sexual Activity  . Alcohol use: Yes    Alcohol/week: 3.0 standard drinks    Types: 1 Glasses of wine, 1 Cans of beer, 1 Shots of liquor per week    Comment: occassionall  . Drug use: No  . Sexual activity: Not Currently  Other Topics Concern  . Not on file  Social History Narrative    . Not on file   Social Determinants of Health   Financial Resource Strain:   . Difficulty of Paying Living Expenses: Not on file  Food Insecurity:   . Worried About Charity fundraiser in the Last Year: Not on file  . Ran Out of Food in the Last Year: Not on file  Transportation Needs:   . Lack of Transportation (Medical): Not on file  . Lack of Transportation (Non-Medical): Not on file  Physical Activity:   . Days of Exercise per Week: Not on file  . Minutes of Exercise per Session: Not on file  Stress:   . Feeling of Stress : Not on file  Social Connections:   . Frequency of Communication with Friends and Family: Not on file  . Frequency of Social Gatherings with Friends and Family: Not on file  . Attends Religious Services: Not on file  . Active Member of Clubs or Organizations: Not on file  . Attends Archivist Meetings: Not on file  . Marital Status: Not on file     Family History: The patient's family history includes Alzheimer's disease in his father; Diabetes in his sister; Heart disease in his mother; Leukemia in his sister; Parkinsonism in his father.  ROS:   Please see the history of present illness.    All other systems reviewed and are negative.  EKGs/Labs/Other Studies Reviewed:    The following studies were reviewed today:  EKG:  Sinus rhythm with sinus arrhythmia.  06/18/19 - atrial fibrillation, rate 122  Recent Labs: 06/18/2019: ALT 38; BUN 26; Creatinine, Ser 1.17; Hemoglobin 15.8; Platelets 301.0; Potassium 4.5; Sodium 128; TSH 1.35  Recent Lipid Panel    Component Value Date/Time   CHOL 207 (H) 02/28/2018 0737   TRIG 174.0 (H) 02/28/2018 0737   HDL 38.30 (L) 02/28/2018 0737   CHOLHDL 5 02/28/2018 0737   VLDL 34.8 02/28/2018 0737   LDLCALC 134 (H) 02/28/2018 0737    Physical Exam:    VS:  BP 117/64 (BP Location: Right Arm)   Pulse 70   Temp (!) 96.9 F (36.1 C)   Ht 6\' 1"  (1.854 m)   Wt 289 lb 3.2 oz (131.2 kg)   SpO2 100%    BMI 38.16 kg/m     Wt Readings from Last 5 Encounters:  06/27/19 279 lb 12.8 oz (126.9 kg)  06/20/19 289 lb 3.2 oz (131.2 kg)  06/18/19 288 lb (130.6 kg)  04/14/19 282 lb 8 oz (128.1 kg)  04/04/19 278 lb (126.1 kg)     Constitutional: No acute distress Eyes: sclera non-icteric, normal conjunctiva and lids ENMT: normal dentition, moist mucous membranes Cardiovascular: regular rhythm, normal rate, no murmurs. S1 and S2 normal. Radial pulses normal bilaterally. No jugular venous distention.  Respiratory: clear to auscultation bilaterally GI :  normal bowel sounds, soft and nontender. No distention.   MSK: extremities warm, well perfused. No edema.  NEURO: grossly nonfocal exam, moves all extremities. PSYCH: alert and oriented x 3, normal mood and affect.   ASSESSMENT:    1. Paroxysmal atrial fibrillation (HCC)   2. Mixed hyperlipidemia   3. Essential hypertension   4. Anxiety associated with depression   5. TIA (transient ischemic attack)    PLAN:    PAF - His ecg demonstrated afib at his PCP office 2 days ago. Currently in sinus rhythm. Discussed initiation of eliquis 5 mg BID for anticoagulation in afib. We discussed that ASA is inadequate anticoagulation for atrial fibrillation, and true [facility] is indicated for chadsvasc of 3. Continue metoprolol 25 mg BID.  This patients CHA2DS2-VASc Score and unadjusted Ischemic Stroke Rate (% per year) is equal to 3.2 % stroke rate/year from a score of 3 Above score calculated as 1 point each if present [CHF, HTN, DM, Vascular=MI/PAD/Aortic Plaque, Age if 65-74, or Male] Above score calculated as 2 points each if present [Age > 75, or Stroke/TIA/TE]  HLD - not currently on lipid lowering therapy, and lipids are not optimized. Would initate statin therapy   HTN - on chlorthalidone, lisinopril and now metoprolol. Continue, as BP is at goal. Counseled on use of tadalafil and antihypertensive medications.  TIA - on ASA 81 mg daily however this  should be reviewed in light of use of eliquis.    Total time of encounter: 60 minutes total time of encounter, including 40 minutes spent in face-to-face patient care. This time includes coordination of care and counseling regarding above issues. Remainder of non-face-to-face time involved reviewing chart documents/testing relevant to the patient encounter and documentation in the medical record.  Cherlynn Kaiser, MD Susan Moore  [facility]   Medication Adjustments/Labs and Tests Ordered: Current medicines are reviewed at length with the patient today.  Concerns regarding medicines are outlined above.  Orders Placed This Encounter  Procedures  . EKG 12-Lead  . ECHOCARDIOGRAM COMPLETE   Meds ordered this encounter  Medications  . DISCONTD: apixaban (ELIQUIS) 5 MG TABS tablet    Sig: Take 1 tablet (5 mg total) by mouth 2 (two) times daily.    Dispense:  60 tablet    Refill:  6    Patient Instructions  .Medication Instructions:   start taking Eliquis 5 mg  Take one tablet twice a day   *If you need a refill on your cardiac medications before your next appointment, please call your pharmacy*  Lab Work: Not needed  Testing/Procedures: Will be schedule at [facility] has requested that you have an echocardiogram. Echocardiography is a painless test that uses sound waves to create images of your heart. It provides your doctor with information about the size and shape of your heart and how well your heart's chambers and valves are working. This procedure takes approximately one hour. There are no restrictions for this procedure.    Follow-Up: At [facility], you and your health needs are our priority.  As part of our continuing mission to provide you with exceptional heart care, we have created designated Provider Care Teams.  These Care Teams include your primary Cardiologist (physician) and Advanced Practice Providers (APPs -  Physician  Assistants and Nurse Practitioners) who all work together to provide you with the care you need, when you need it.  Your next appointment:   1 month(s)  The format for  your next appointment:   In Person  Provider:   Cherlynn Kaiser, MD  Other Instructions N/a    Atrial Fibrillation  Atrial fibrillation is a type of heartbeat that is irregular or fast. If you have this condition, your heart beats without any order. This makes it hard for your heart to pump blood in a normal way. Atrial fibrillation may come and go, or it may become a long-lasting problem. If this condition is not treated, it can put you at higher risk for stroke, heart failure, and other heart problems. What are the causes? This condition may be caused by diseases that damage the heart. They include:  High blood pressure.  Heart failure.  Heart valve disease.  Heart surgery. Other causes include:  Diabetes.  Thyroid disease.  Being overweight.  Kidney disease. Sometimes the cause is not known. What increases the risk? You are more likely to develop this condition if:  You are older.  You smoke.  You exercise often and very hard.  You have a family history of this condition.  You are a man.  You use drugs.  You drink a lot of alcohol.  You have lung conditions, such as emphysema, pneumonia, or COPD.  You have sleep apnea. What are the signs or symptoms? Common symptoms of this condition include:  A feeling that your heart is beating very fast.  Chest pain or discomfort.  Feeling short of breath.  Suddenly feeling light-headed or weak.  Getting tired easily during activity.  Fainting.  Sweating. In some cases, there are no symptoms. How is this treated? Treatment for this condition depends on underlying conditions and how you feel when you have atrial fibrillation. They include:  Medicines to: ? Prevent blood clots. ? Treat heart rate or heart rhythm problems.  Using  devices, such as a pacemaker, to correct heart rhythm problems.  Doing surgery to remove the part of the heart that sends bad signals.  Closing an area where clots can form in the heart (left atrial appendage). In some cases, your doctor will treat other underlying conditions. Follow these instructions at home: Medicines  Take over-the-counter and prescription medicines only as told by your doctor.  Do not take any new medicines without first talking to your doctor.  If you are taking blood thinners: ? Talk with your doctor before you take any medicines that have aspirin or NSAIDs, such as ibuprofen, in them. ? Take your medicine exactly as told by your doctor. Take it at the same time each day. ? Avoid activities that could hurt or bruise you. Follow instructions about how to prevent falls. ? Wear a bracelet that says you are taking blood thinners. Or, carry a card that lists what medicines you take. Lifestyle      Do not use any products that have nicotine or tobacco in them. These include cigarettes, e-cigarettes, and chewing tobacco. If you need help quitting, ask your doctor.  Eat heart-healthy foods. Talk with your doctor about the right eating plan for you.  Exercise regularly as told by your doctor.  Do not drink alcohol.  Lose weight if you are overweight.  Do not use drugs, including cannabis. General instructions  If you have a condition that causes breathing to stop for a short period of time (apnea), treat it as told by your doctor.  Keep a healthy weight. Do not use diet pills unless your doctor says they are safe for you. Diet pills may make heart problems worse.  Keep all follow-up visits as told by your doctor. This is important. Contact a doctor if:  You notice a change in the speed, rhythm, or strength of your heartbeat.  You are taking a blood-thinning medicine and you get more bruising.  You get tired more easily when you move or exercise.  You  have a sudden change in weight. Get help right away if:   You have pain in your chest or your belly (abdomen).  You have trouble breathing.  You have side effects of blood thinners, such as blood in your vomit, poop (stool), or pee (urine), or bleeding that cannot stop.  You have any signs of a stroke. "BE FAST" is an easy way to remember the main warning signs: ? B - Balance. Signs are dizziness, sudden trouble walking, or loss of balance. ? E - Eyes. Signs are trouble seeing or a change in how you see. ? F - Face. Signs are sudden weakness or loss of feeling in the face, or the face or eyelid drooping on one side. ? A - Arms. Signs are weakness or loss of feeling in an arm. This happens suddenly and usually on one side of the body. ? S - Speech. Signs are sudden trouble speaking, slurred speech, or trouble understanding what people say. ? T - Time. Time to call emergency services. Write down what time symptoms started.  You have other signs of a stroke, such as: ? A sudden, very bad headache with no known cause. ? Feeling like you may vomit (nausea). ? Vomiting. ? A seizure. These symptoms may be an emergency. Do not wait to see if the symptoms will go away. Get medical help right away. Call your local emergency services (911 in the [place] Do not drive yourself to the hospital. Summary  Atrial fibrillation is a type of heartbeat that is irregular or fast.  You are at higher risk of this condition if you smoke, are older, have diabetes, or are overweight.  Follow your doctor's instructions about medicines, diet, exercise, and follow-up visits.  Get help right away if you have signs or symptoms of a stroke.  Get help right away if you cannot catch your breath, or you have chest pain or discomfort. This information is not intended to replace advice given to you by your health care provider. Make sure you discuss any questions you have with your health care provider. Document  Revised: 10/30/2018 Document Reviewed: 10/30/2018 Elsevier Patient Education  [place].

## 2019-06-19 NOTE — Telephone Encounter (Signed)
-----   Message from Minus Breeding, MD sent at 06/18/2019  4:12 PM EST ----- Atrial fib needs new patient

## 2019-06-20 ENCOUNTER — Encounter: Payer: Self-pay | Admitting: Internal Medicine

## 2019-06-20 ENCOUNTER — Ambulatory Visit (INDEPENDENT_AMBULATORY_CARE_PROVIDER_SITE_OTHER): Payer: BC Managed Care – PPO | Admitting: Internal Medicine

## 2019-06-20 ENCOUNTER — Other Ambulatory Visit: Payer: Self-pay

## 2019-06-20 VITALS — BP 117/64 | HR 70 | Temp 96.9°F | Ht 73.0 in | Wt 289.2 lb

## 2019-06-20 DIAGNOSIS — E782 Mixed hyperlipidemia: Secondary | ICD-10-CM

## 2019-06-20 DIAGNOSIS — G459 Transient cerebral ischemic attack, unspecified: Secondary | ICD-10-CM

## 2019-06-20 DIAGNOSIS — I480 Paroxysmal atrial fibrillation: Secondary | ICD-10-CM

## 2019-06-20 DIAGNOSIS — F418 Other specified anxiety disorders: Secondary | ICD-10-CM

## 2019-06-20 DIAGNOSIS — I10 Essential (primary) hypertension: Secondary | ICD-10-CM | POA: Diagnosis not present

## 2019-06-20 MED ORDER — APIXABAN 5 MG PO TABS: 5.0000 mg | ORAL_TABLET | Freq: Two times a day (BID) | ORAL | 6 refills | Status: DC

## 2019-06-20 NOTE — Patient Instructions (Addendum)
.Medication Instructions:   start taking Eliquis 5 mg  Take one tablet twice a day   *If you need a refill on your cardiac medications before your next appointment, please call your pharmacy*  Lab Work: Not needed  Testing/Procedures: Will be schedule at [facility] has requested that you have an echocardiogram. Echocardiography is a painless test that uses sound waves to create images of your heart. It provides your doctor with information about the size and shape of your heart and how well your heart's chambers and valves are working. This procedure takes approximately one hour. There are no restrictions for this procedure.    Follow-Up: At [facility], you and your health needs are our priority.  As part of our continuing mission to provide you with exceptional heart care, we have created designated Provider Care Teams.  These Care Teams include your primary Cardiologist (physician) and Advanced Practice Providers (APPs -  Physician Assistants and Nurse Practitioners) who all work together to provide you with the care you need, when you need it.  Your next appointment:   1 month(s)  The format for your next appointment:   In Person  Provider:   Cherlynn Kaiser, MD  Other Instructions N/a    Atrial Fibrillation  Atrial fibrillation is a type of heartbeat that is irregular or fast. If you have this condition, your heart beats without any order. This makes it hard for your heart to pump blood in a normal way. Atrial fibrillation may come and go, or it may become a long-lasting problem. If this condition is not treated, it can put you at higher risk for stroke, heart failure, and other heart problems. What are the causes? This condition may be caused by diseases that damage the heart. They include:  High blood pressure.  Heart failure.  Heart valve disease.  Heart surgery. Other causes include:  Diabetes.  Thyroid  disease.  Being overweight.  Kidney disease. Sometimes the cause is not known. What increases the risk? You are more likely to develop this condition if:  You are older.  You smoke.  You exercise often and very hard.  You have a family history of this condition.  You are a man.  You use drugs.  You drink a lot of alcohol.  You have lung conditions, such as emphysema, pneumonia, or COPD.  You have sleep apnea. What are the signs or symptoms? Common symptoms of this condition include:  A feeling that your heart is beating very fast.  Chest pain or discomfort.  Feeling short of breath.  Suddenly feeling light-headed or weak.  Getting tired easily during activity.  Fainting.  Sweating. In some cases, there are no symptoms. How is this treated? Treatment for this condition depends on underlying conditions and how you feel when you have atrial fibrillation. They include:  Medicines to: ? Prevent blood clots. ? Treat heart rate or heart rhythm problems.  Using devices, such as a pacemaker, to correct heart rhythm problems.  Doing surgery to remove the part of the heart that sends bad signals.  Closing an area where clots can form in the heart (left atrial appendage). In some cases, your doctor will treat other underlying conditions. Follow these instructions at home: Medicines  Take over-the-counter and prescription medicines only as told by your doctor.  Do not take any new medicines without first talking to your doctor.  If you are taking blood thinners: ? Talk with your doctor  before you take any medicines that have aspirin or NSAIDs, such as ibuprofen, in them. ? Take your medicine exactly as told by your doctor. Take it at the same time each day. ? Avoid activities that could hurt or bruise you. Follow instructions about how to prevent falls. ? Wear a bracelet that says you are taking blood thinners. Or, carry a card that lists what medicines you  take. Lifestyle      Do not use any products that have nicotine or tobacco in them. These include cigarettes, e-cigarettes, and chewing tobacco. If you need help quitting, ask your doctor.  Eat heart-healthy foods. Talk with your doctor about the right eating plan for you.  Exercise regularly as told by your doctor.  Do not drink alcohol.  Lose weight if you are overweight.  Do not use drugs, including cannabis. General instructions  If you have a condition that causes breathing to stop for a short period of time (apnea), treat it as told by your doctor.  Keep a healthy weight. Do not use diet pills unless your doctor says they are safe for you. Diet pills may make heart problems worse.  Keep all follow-up visits as told by your doctor. This is important. Contact a doctor if:  You notice a change in the speed, rhythm, or strength of your heartbeat.  You are taking a blood-thinning medicine and you get more bruising.  You get tired more easily when you move or exercise.  You have a sudden change in weight. Get help right away if:   You have pain in your chest or your belly (abdomen).  You have trouble breathing.  You have side effects of blood thinners, such as blood in your vomit, poop (stool), or pee (urine), or bleeding that cannot stop.  You have any signs of a stroke. "BE FAST" is an easy way to remember the main warning signs: ? B - Balance. Signs are dizziness, sudden trouble walking, or loss of balance. ? E - Eyes. Signs are trouble seeing or a change in how you see. ? F - Face. Signs are sudden weakness or loss of feeling in the face, or the face or eyelid drooping on one side. ? A - Arms. Signs are weakness or loss of feeling in an arm. This happens suddenly and usually on one side of the body. ? S - Speech. Signs are sudden trouble speaking, slurred speech, or trouble understanding what people say. ? T - Time. Time to call emergency services. Write down what  time symptoms started.  You have other signs of a stroke, such as: ? A sudden, very bad headache with no known cause. ? Feeling like you may vomit (nausea). ? Vomiting. ? A seizure. These symptoms may be an emergency. Do not wait to see if the symptoms will go away. Get medical help right away. Call your local emergency services (911 in the [place] Do not drive yourself to the hospital. Summary  Atrial fibrillation is a type of heartbeat that is irregular or fast.  You are at higher risk of this condition if you smoke, are older, have diabetes, or are overweight.  Follow your doctor's instructions about medicines, diet, exercise, and follow-up visits.  Get help right away if you have signs or symptoms of a stroke.  Get help right away if you cannot catch your breath, or you have chest pain or discomfort. This information is not intended to replace advice given to you by your health care  provider. Make sure you discuss any questions you have with your health care provider. Document Revised: 10/30/2018 Document Reviewed: 10/30/2018 Elsevier Patient Education  [place].

## 2019-06-22 ENCOUNTER — Encounter: Payer: Self-pay | Admitting: Family Medicine

## 2019-06-23 ENCOUNTER — Other Ambulatory Visit: Payer: Self-pay | Admitting: Family Medicine

## 2019-06-23 DIAGNOSIS — M542 Cervicalgia: Secondary | ICD-10-CM

## 2019-06-23 NOTE — Telephone Encounter (Signed)
Received a 90 day refill request for Gabapentin.   Called the patient to clarify if he was wanting or not. He declined this refill for now as he has appt with PCP on Friday 06/27/19 and can discuss options at that time.

## 2019-06-24 ENCOUNTER — Other Ambulatory Visit: Payer: Self-pay

## 2019-06-24 ENCOUNTER — Ambulatory Visit (HOSPITAL_BASED_OUTPATIENT_CLINIC_OR_DEPARTMENT_OTHER)
Admission: RE | Admit: 2019-06-24 | Discharge: 2019-06-24 | Disposition: A | Discharge status: Home / Self Care | Payer: BC Managed Care – PPO | Source: Ambulatory Visit | Attending: Family Medicine | Admitting: Family Medicine

## 2019-06-24 DIAGNOSIS — M542 Cervicalgia: Secondary | ICD-10-CM | POA: Diagnosis not present

## 2019-06-25 ENCOUNTER — Other Ambulatory Visit: Payer: Self-pay

## 2019-06-27 ENCOUNTER — Encounter: Payer: Self-pay | Admitting: Family Medicine

## 2019-06-27 ENCOUNTER — Other Ambulatory Visit: Payer: Self-pay

## 2019-06-27 ENCOUNTER — Ambulatory Visit (INDEPENDENT_AMBULATORY_CARE_PROVIDER_SITE_OTHER): Payer: BC Managed Care – PPO | Admitting: Family Medicine

## 2019-06-27 ENCOUNTER — Ambulatory Visit: Payer: BC Managed Care – PPO | Admitting: Cardiology

## 2019-06-27 VITALS — BP 140/90 | HR 70 | Temp 97.1°F | Resp 18 | Ht 73.0 in | Wt 279.8 lb

## 2019-06-27 DIAGNOSIS — G8929 Other chronic pain: Secondary | ICD-10-CM

## 2019-06-27 DIAGNOSIS — F418 Other specified anxiety disorders: Secondary | ICD-10-CM | POA: Diagnosis not present

## 2019-06-27 DIAGNOSIS — M542 Cervicalgia: Secondary | ICD-10-CM | POA: Diagnosis not present

## 2019-06-27 MED ORDER — PANTOPRAZOLE SODIUM 40 MG PO TBEC: 40.0000 mg | DELAYED_RELEASE_TABLET | Freq: Every day | ORAL | 1 refills | Status: DC

## 2019-06-27 MED ORDER — TRAZODONE HCL 150 MG PO TABS: 150.0000 mg | ORAL_TABLET | Freq: Every day | ORAL | 2 refills | Status: DC

## 2019-06-27 MED ORDER — METOPROLOL TARTRATE 25 MG PO TABS: 25.0000 mg | ORAL_TABLET | Freq: Two times a day (BID) | ORAL | 1 refills | Status: DC

## 2019-06-27 NOTE — Progress Notes (Signed)
Musculoskeletal Exam  Patient: Javier King DOB: 22-Jul-1955  DOS: 06/27/2019  SUBJECTIVE:  Chief Complaint:   Chief Complaint  Patient presents with  . Neck Pain  . Shoulder Pain  . Arm Pain  . Anxiety  . Medication Refill    Wants to discuss medications that he needs to refill     [name] is a 64 y.o.  male for evaluation and treatment of neck pain.   Onset:  3 months ago. No inj or change in activity.  Location: R side of neck Character:  aching and sharp  Progression of issue:  has improved in some ways and worsened in others Associated symptoms: shooting/nerve pain down R arm Treatment: to date has been rest, ice, OTC NSAIDS, prescription NSAIDS, acetaminophen, oral steroids, muscle relaxers and home exercises.   Neurovascular symptoms: no  Over the past 6 weeks, the patient has been experiencing nighttime anxiety.  He feels that he is on a decline in that his feet are above his head.  This position makes it feel like he cannot breathe.  That sensation causes him to have a quite a bit of anxiety.  He is not following with a counselor or psychologist.  He is taking trazodone 100 mg nightly which is helpful for depression and anxiety normally.  He reports compliance with this medication and is not having any adverse effects.  He is not exercising routinely right now which he attributes to poor sleep.  ROS: Musculoskeletal/Extremities: +neck pain Psych: +anxiety  Past Medical History:  Diagnosis Date  . 10 year risk of MI or stroke 7.5% or greater 01/20/2016  . 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 Cancer ([place]) [id number]   skin cancer  . DDD (degenerative disc disease), lumbar   . Depression 01/14/2016  . Erectile dysfunction   . Essential hypertension 01/14/2016  . History of TIA (transient ischemic attack)    per pt/ unsure if had TIA   . Hyperlipidemia   . Pericarditis 1986  . Tubular adenoma of colon    02/17/10    Objective: VITAL SIGNS:  BP 140/90 (BP Location: Left Arm, Patient Position: Sitting, Cuff Size: Normal)   Pulse 70   Temp (!) 97.1 F (36.2 C) (Temporal)   Resp 18   Ht 6\' 1"  (1.854 m)   Wt 279 lb 12.8 oz (126.9 kg)   SpO2 99%   BMI 36.92 kg/m  Constitutional: Well formed, well developed. No acute distress. Cardiovascular: RRR Thorax & Lungs: No accessory muscle use Musculoskeletal: neck.   Normal active range of motion: Yes.   Normal passive range of motion: yes Tenderness to palpation: no Deformity: no Ecchymosis: no Tests positive: Spurling's neg on R, but when he turns head it elicits burning pain down RUE Neurologic: Normal sensory function. No focal deficits noted. No clonus. Psychiatric: Normal mood. Age appropriate judgment and insight. Alert & oriented x 3.    Assessment:  Neck pain, chronic - Plan: Ambulatory referral to Physical Therapy, Ambulatory referral to Physical Medicine Rehab  Anxiety associated with depression - Plan: traZODone (DESYREL) 150 MG tablet  Plan: 1- Refer to PT, discussed PM&R and he would like to proceed. Heat. Stretches/exercises. 2- Increase trazodone from 100 mg qhs to 150 mg qhs. [facility] info provided also.  F/u in 4 weeks. The patient voiced understanding and agreement to the plan.   [place], DO 06/27/19  10:46 AM

## 2019-06-27 NOTE — Patient Instructions (Addendum)
Please consider counseling. Contact [phone] to schedule an appointment or inquire about cost/insurance coverage.  Aim to do some physical exertion for 150 minutes per week. This is typically divided into 5 days per week, 30 minutes per day. The activity should be enough to get your heart rate up. Anything is better than nothing if you have time constraints.  If you do not hear anything about your referral in the next 1-2 weeks, call our office and ask for an update.  Heat (pad or rice pillow in microwave) over affected area, 10-15 minutes twice daily.    Go on aspirin and let your heart doctor know about the cost of Eliquis.    Let us know if you need anything.

## 2019-07-02 ENCOUNTER — Ambulatory Visit (HOSPITAL_COMMUNITY): Payer: BC Managed Care – PPO | Attending: Internal Medicine

## 2019-07-02 ENCOUNTER — Other Ambulatory Visit: Payer: Self-pay

## 2019-07-02 DIAGNOSIS — I480 Paroxysmal atrial fibrillation: Secondary | ICD-10-CM | POA: Diagnosis not present

## 2019-07-03 ENCOUNTER — Ambulatory Visit: Payer: BC Managed Care – PPO | Attending: Family Medicine | Admitting: Physical Therapy

## 2019-07-03 DIAGNOSIS — M5412 Radiculopathy, cervical region: Secondary | ICD-10-CM | POA: Insufficient documentation

## 2019-07-03 DIAGNOSIS — M542 Cervicalgia: Secondary | ICD-10-CM | POA: Diagnosis not present

## 2019-07-03 DIAGNOSIS — R293 Abnormal posture: Secondary | ICD-10-CM | POA: Diagnosis not present

## 2019-07-03 NOTE — Patient Instructions (Signed)
    Home exercise program created by Amarri Michaelson, PT.  For questions, please contact Quinterious Walraven via phone at [phone] or email at [email or web address]  [facility] [place] [address]  [address] [place], [place], [address] Phone: [phone]   Fax:  [phone]    

## 2019-07-03 NOTE — Therapy (Addendum)
[facility] [place] [address]  [place], [place], [address] Phone: [phone]   Fax:  [phone]  Physical Therapy Evaluation / Discharge Summary  Patient Details  Name: Javier King MRN: [id number] Date of Birth: 1955/08/16 Referring Provider (PT): Crosby Oyster [place], [place]   Encounter Date: 07/03/2019  PT End of Session - 07/03/19 1700    Visit Number  1    Number of Visits  4    Date for PT Re-Evaluation  07/31/19    Authorization Type  BCBS - [place]    PT Start Time  1700    PT Stop Time  5035    PT Time Calculation (min)  53 min    Activity Tolerance  Patient tolerated treatment well    Behavior During Therapy  [facility] for tasks assessed/performed       Past Medical History:  Diagnosis Date  . 10 year risk of MI or stroke 7.5% or greater 01/20/2016  . 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 Cancer ([place]) [id number]   skin cancer  . DDD (degenerative disc disease), lumbar   . Depression 01/14/2016  . Erectile dysfunction   . Essential hypertension 01/14/2016  . History of TIA (transient ischemic attack)    per pt/ unsure if had TIA   . Hyperlipidemia   . Pericarditis 1986  . Tubular adenoma of colon    02/17/10    Past Surgical History:  Procedure Laterality Date  . COLONOSCOPY    . OTHER SURGICAL HISTORY  2003,2004   Back surgery/ 2 laminectomies in lower back  . TONSILLECTOMY      There were no vitals filed for this visit.   Subjective Assessment - 07/03/19 1706    Subjective  Pt reporting he was having pain in the R side of his neck extending down his R arm dissipating as it travels down the arm. Pain originated in Nov and then resolved for a while but "came back with a vengeance" in late Dec/Jan. Was referred to PT earlier but did not come due to pain resolving. Biggest complaint was that pain would keep him from sleeping. Pain now resolved again as of Sunday.    Limitations  Other  (comment)   sleeping   Diagnostic tests  Cervical x-ray 06/24/19 - No acute fracture or static listhesis. Mild cervical spondylosis most pronounced at C5-6 and C6-7.    Patient Stated Goals  "to keep the pain from coming back again"    Currently in Pain?  No/denies    Pain Score  0-No pain   5-6/10 on average; 10/10 at worst   Pain Location  Neck    Pain Orientation  Right;Upper;Lower;Lateral    Pain Descriptors / Indicators  --   "nervy" & "stress type tension"   Pain Type  Acute pain    Pain Radiating Towards  pain ("inflamed") down arm into forearm    Pain Onset  More than a month ago   Nov 2020 with recurrent exacerbation in Dec/Jan   Pain Frequency  Intermittent    Aggravating Factors   putting pressure on arm, laying down    Pain Relieving Factors  hot shower, heating pad    Effect of Pain on Daily Activities  interfered with sleep         [facility] PT Assessment - 07/03/19 1700      Assessment   Medical Diagnosis  Neck pain    Referring Provider (PT)  Crosby Oyster  [place], DO    Onset Date/Surgical Date  --   initial onset Nov 2020 with exacerbation late Dec 2020/Jan 2   Next MD Visit  3/2//21    Prior Therapy  PT for LBP      Precautions   Precautions  None      Balance Screen   Has the patient fallen in the past 6 months  No    Has the patient had a decrease in activity level because of a fear of falling?   No    Is the patient reluctant to leave their home because of a fear of falling?   No      Home Environment   Living Environment  Private residence    Living Arrangements  Alone    Type of [place] Access  Level entry      Prior Function   Level of Independence  Independent    Vocation  Full time employment    Vocation Requirements  working from home on [employer]   Overall Cognitive Status  Within Functional Limits for tasks assessed      Observation/Other Assessments   Focus on Therapeutic Outcomes (FOTO)   Neck - 68% (32%  limitation); Predicted 73% (27% limitation)      Posture/Postural Control   Posture/Postural Control  Postural limitations    Postural Limitations  Forward head;Rounded Shoulders;Decreased lumbar lordosis      ROM / Strength   AROM / PROM / Strength  AROM;Strength      AROM   Overall AROM Comments  B shoulder flexion & abduction WFL/WNL but mild/mod restriction with IR/ER (R>L)    AROM Assessment Site  Cervical    Cervical Flexion  58    Cervical Extension  36    Cervical - Right Side Bend  30    Cervical - Left Side Bend  26    Cervical - Right Rotation  62    Cervical - Left Rotation  57      Strength   Strength Assessment Site  Shoulder;Hand    Right/Left Shoulder  Right;Left    Right Shoulder Flexion  5/5    Right Shoulder ABduction  5/5    Right Shoulder Internal Rotation  5/5    Right Shoulder External Rotation  4+/5    Left Shoulder Flexion  5/5    Left Shoulder ABduction  5/5    Left Shoulder Internal Rotation  5/5    Left Shoulder External Rotation  4+/5    Right/Left hand  Right;Left    Right Hand Gross Grasp  Functional    Left Hand Gross Grasp  Functional                Objective measurements completed on examination: See above findings.      [place] Adult PT Treatment/Exercise - 07/03/19 1700      Exercises   Exercises  Neck      Neck Exercises: Theraband   Scapula Retraction  10 reps;Green   5" hold   Scapula Retraction Limitations  + shoulder extension to neutral    Rows  10 reps;Green   5" hold   Rows Limitations  cues for scap retraction with increased hold time      Neck Exercises: Stretches   Corner Stretch  30 seconds;3 reps    Corner Stretch Limitations  3 way doorway stretch  PT Education - 07/03/19 1750    Education Details  PT eval findings, anticipated POC & initial HEP    Person(s) Educated  Patient    Methods  Explanation;Demonstration;Handout;Verbal cues;Tactile cues    Comprehension  Verbalized  understanding;Returned demonstration;Verbal cues required;Tactile cues required;Need further instruction          PT Long Term Goals - 07/03/19 1753      PT LONG TERM GOAL #1   Title  Patient will be independent with initial HEP    Status  New    Target Date  07/31/19      PT LONG TERM GOAL #2   Title  Patient to demonstrate appropriate posture and body mechanics needed for daily activities    Status  New    Target Date  07/31/19      PT LONG TERM GOAL #3   Title  Patient to report ability to perform work and daily activities without pain provocation    Status  New    Target Date  07/31/19      PT LONG TERM GOAL #4   Title  Patient will report no sleep disturbance due to neck pain or UE radiculopathy    Status  New    Target Date  07/31/19             Plan - 07/03/19 1753    Clinical Impression Statement  Maddyx is a 64 y/o male who presents to OP PT for chronic neck pain. He reports initial onset in Nov 2020 without known MOI and states pain resolved on its own but then recurred as of late Dec/Jan and now has resolved again as of Sunday 06/29/19. Pain has been up to 10/10 in R side of neck with radicular pain into R arm dissipating as it travels down the arm. He denies numbness or tingling. Current deficits include poor posture and mildly limited cervical ROM as well as shoulder IR/ER ROM, however no strength deficits observed. Kylil will benefit from skilled PT to address above deficits to reduce likelihood of pain recurrence.    Personal Factors and Comorbidities  Comorbidity 3+;Time since onset of injury/illness/exacerbation;Past/Current Experience    Comorbidities  HTN, lumbar DDD, lumbar laminectomies in 2003 & 2004, anxiety, depression, skin cancer removed from R shoulder    Examination-Activity Limitations  Sleep    Stability/Clinical Decision Making  Stable/Uncomplicated    Clinical Decision Making  Low    PT Frequency  1x / week    PT Duration  4 weeks    PT  Treatment/Interventions  ADLs/Self Care Home Management;Cryotherapy;Electrical Stimulation;Iontophoresis 39m/ml Dexamethasone;Moist Heat;Traction;Ultrasound;Functional mobility training;Therapeutic activities;Therapeutic exercise;Neuromuscular re-education;Patient/family education;Manual techniques;Passive range of motion;Dry needling;Taping;Spinal Manipulations    PT Next Visit Plan  Review initial HEP and progress as indicated; posture & body mechanics education; postural stretching and strengthening; manual therapy and modalities PRN    PT Home Exercise Plan  07/03/19 - 3-way doorway pec stretch, green TB rows and retraction, chin tuck    Consulted and Agree with Plan of Care  Patient       Patient will benefit from skilled therapeutic intervention in order to improve the following deficits and impairments:  Decreased activity tolerance, Decreased knowledge of precautions, Decreased range of motion, Decreased safety awareness, Increased fascial restricitons, Increased muscle spasms, Impaired perceived functional ability, Impaired flexibility, Improper body mechanics, Postural dysfunction, Pain  Visit Diagnosis: Cervicalgia  Radiculopathy, cervical region  Abnormal posture     Problem List Patient Active Problem List   Diagnosis  Date Noted  . 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 Hyperlipidemia 01/20/2016  . 10 year risk of MI or stroke 7.5% or greater 01/20/2016  . Tubular adenoma 01/20/2016  . Essential hypertension 01/14/2016  . Insomnia 01/14/2016    Percival Spanish, PT, MPT 07/03/2019, 6:45 PM  [facility] [address]  [place], [place], [address] Phone: [phone]   Fax:  [phone]  Name: JEROLD YOSS MRN: [id number] Date of Birth: 11/11/1955  PHYSICAL THERAPY DISCHARGE SUMMARY  Visits from Start of Care: 1  Current functional level related to goals / functional outcomes:   Refer to  above clinical impression for status as of eval visit on 07/03/2019. Patient cancelled all remaining visits stating he was feeling better, therefore will proceed with discharge from PT for this episode.   Remaining deficits:   As above.   Education / Equipment:   Initial HEP  Plan: Patient agrees to discharge.  Patient goals were not met. Patient is being discharged due to the patient's request.  ?????     Percival Spanish, PT, MPT 08/01/19, 10:08 AM  [facility] [address]  [place], [place], [address] Phone: [phone]   Fax:  [phone]

## 2019-07-10 ENCOUNTER — Other Ambulatory Visit: Payer: Self-pay | Admitting: Family Medicine

## 2019-07-10 DIAGNOSIS — F418 Other specified anxiety disorders: Secondary | ICD-10-CM

## 2019-07-10 DIAGNOSIS — G4700 Insomnia, unspecified: Secondary | ICD-10-CM

## 2019-07-10 MED ORDER — TRAZODONE HCL 150 MG PO TABS: 150.0000 mg | ORAL_TABLET | Freq: Every day | ORAL | 0 refills | Status: DC

## 2019-07-15 ENCOUNTER — Other Ambulatory Visit: Payer: Self-pay | Admitting: Family Medicine

## 2019-07-15 DIAGNOSIS — I10 Essential (primary) hypertension: Secondary | ICD-10-CM

## 2019-07-17 ENCOUNTER — Ambulatory Visit: Payer: BC Managed Care – PPO | Admitting: Internal Medicine

## 2019-07-21 ENCOUNTER — Other Ambulatory Visit: Payer: Self-pay

## 2019-07-22 ENCOUNTER — Ambulatory Visit (INDEPENDENT_AMBULATORY_CARE_PROVIDER_SITE_OTHER): Payer: BC Managed Care – PPO | Admitting: Family Medicine

## 2019-07-22 ENCOUNTER — Other Ambulatory Visit: Payer: Self-pay

## 2019-07-22 ENCOUNTER — Encounter: Payer: Self-pay | Admitting: Family Medicine

## 2019-07-22 DIAGNOSIS — G4700 Insomnia, unspecified: Secondary | ICD-10-CM | POA: Diagnosis not present

## 2019-07-22 DIAGNOSIS — F418 Other specified anxiety disorders: Secondary | ICD-10-CM | POA: Diagnosis not present

## 2019-07-22 MED ORDER — ZOLPIDEM TARTRATE 5 MG PO TABS: 5.0000 mg | ORAL_TABLET | Freq: Every evening | ORAL | 0 refills | Status: DC | PRN

## 2019-07-22 MED ORDER — TRAZODONE HCL 100 MG PO TABS: 100.0000 mg | ORAL_TABLET | Freq: Every day | ORAL | 2 refills | Status: DC

## 2019-07-22 NOTE — Patient Instructions (Signed)
Let's go back to the 100 mg of trazodone. If you have to go to 150 mg nightly, let me know.  Let us know if you need anything.

## 2019-07-22 NOTE — Progress Notes (Signed)
CC:   Subjective: Patient is a 64 y.o. male here for f/u.  Patient increased dose of trazodone from 100 mg nightly to 150 mg nightly.  Reports sleeping very well.  His pain is also improved so is unsure if that is the reason.  ROS: Psych: Insomnia resolved  Past Medical History:  Diagnosis Date  . 10 year risk of MI or stroke 7.5% or greater 01/20/2016  . 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 Cancer ([place]) [id number]   skin cancer  . DDD (degenerative disc disease), lumbar   . Depression 01/14/2016  . Erectile dysfunction   . Essential hypertension 01/14/2016  . History of TIA (transient ischemic attack)    per pt/ unsure if had TIA   . Hyperlipidemia   . Pericarditis 1986  . Tubular adenoma of colon    02/17/10    Objective: BP 132/76 (BP Location: Left Arm, Patient Position: Sitting, Cuff Size: Normal)   Pulse 78   Temp (!) 95.8 F (35.4 C) (Temporal)   Ht 6\' 1"  (1.854 m)   Wt 289 lb (131.1 kg)   SpO2 97%   BMI 38.13 kg/m  General: Awake, appears stated age Heart: RRR Lungs: No accessory muscle use Psych: Age appropriate judgment and insight, normal affect and mood  Assessment and Plan: Anxiety associated with depression  Insomnia, unspecified type - Plan: zolpidem (AMBIEN) 5 MG tablet, traZODone (DESYREL) 100 MG tablet  Go back to 100 mg of trazodone daily.  Continue Ambien as needed. I will see him for his physical in 3 weeks as originally scheduled and then every 6 months unless he needs [place]. The patient voiced understanding and agreement to the plan.  [place], DO 07/22/19  5:01 PM

## 2019-07-31 ENCOUNTER — Encounter: Payer: Self-pay | Admitting: Physical Therapy

## 2019-07-31 ENCOUNTER — Telehealth: Payer: Self-pay

## 2019-07-31 ENCOUNTER — Encounter: Payer: Self-pay | Admitting: Internal Medicine

## 2019-07-31 ENCOUNTER — Telehealth (INDEPENDENT_AMBULATORY_CARE_PROVIDER_SITE_OTHER): Payer: BC Managed Care – PPO | Admitting: Internal Medicine

## 2019-07-31 VITALS — BP 125/80 | HR 65 | Ht 73.0 in | Wt 285.0 lb

## 2019-07-31 DIAGNOSIS — R7989 Other specified abnormal findings of blood chemistry: Secondary | ICD-10-CM | POA: Diagnosis not present

## 2019-07-31 DIAGNOSIS — E785 Hyperlipidemia, unspecified: Secondary | ICD-10-CM

## 2019-07-31 DIAGNOSIS — Z8673 Personal history of transient ischemic attack (TIA), and cerebral infarction without residual deficits: Secondary | ICD-10-CM

## 2019-07-31 DIAGNOSIS — I480 Paroxysmal atrial fibrillation: Secondary | ICD-10-CM

## 2019-07-31 DIAGNOSIS — F418 Other specified anxiety disorders: Secondary | ICD-10-CM

## 2019-07-31 DIAGNOSIS — E782 Mixed hyperlipidemia: Secondary | ICD-10-CM

## 2019-07-31 DIAGNOSIS — Z7982 Long term (current) use of aspirin: Secondary | ICD-10-CM

## 2019-07-31 DIAGNOSIS — I10 Essential (primary) hypertension: Secondary | ICD-10-CM

## 2019-07-31 NOTE — Patient Instructions (Addendum)
Medication Instructions:  Your physician recommends that you continue on your current medications as directed. Please refer to the Current Medication list given to you today.  If you need a refill on your cardiac medications before your next appointment, please call your pharmacy.   Lab work: NONE  Testing/Procedures: NONE  Follow-Up: At [facility], you and your health needs are our priority.  As part of our continuing mission to provide you with exceptional heart care, we have created designated Provider Care Teams.  These Care Teams include your primary Cardiologist (physician) and Advanced Practice Providers (APPs -  Physician Assistants and Nurse Practitioners) who all work together to provide you with the care you need, when you need it. You may see Elouise Munroe, MD or one of the following Advanced Practice Providers on your designated Care Team:    Kerin Ransom, PA-C  [place], [place]  Coletta Memos, [place]  Your physician wants you to follow-up in: 6 months with Dr. Margaretann Loveless. You will receive a reminder letter in the mail two months in advance. If you don't receive a letter, please call our office to schedule the follow-up appointment.  Any Other Special Instructions Will Be Listed Below (If Applicable).  Mediterranean Diet A Mediterranean diet refers to food and lifestyle choices that are based on the traditions of countries located on the [employer]. This way of eating has been shown to help prevent certain conditions and improve outcomes for people who have chronic diseases, like kidney disease and heart disease. What are tips for following this plan? Lifestyle  Cook and eat meals together with your family, when possible.  Drink enough fluid to keep your urine clear or pale yellow.  Be physically active every day. This includes: ? Aerobic exercise like running or swimming. ? Leisure activities like gardening, walking, or housework.  Get 7-8 hours of  sleep each night.  If recommended by your health care provider, drink red wine in moderation. This means 1 glass a day for nonpregnant women and 2 glasses a day for men. A glass of wine equals 5 oz (150 mL). Reading food labels   Check the serving size of packaged foods. For foods such as rice and pasta, the serving size refers to the amount of cooked product, not dry.  Check the total fat in packaged foods. Avoid foods that have saturated fat or trans fats.  Check the ingredients list for added sugars, such as corn syrup. Shopping  At the grocery store, buy most of your food from the areas near the walls of the store. This includes: ? Fresh fruits and vegetables (produce). ? Grains, beans, nuts, and seeds. Some of these may be available in unpackaged forms or large amounts (in bulk). ? Fresh seafood. ? Poultry and eggs. ? Low-fat dairy products.  Buy whole ingredients instead of prepackaged foods.  Buy fresh fruits and vegetables in-season from local farmers markets.  Buy frozen fruits and vegetables in resealable bags.  If you do not have access to quality fresh seafood, buy precooked frozen shrimp or canned fish, such as tuna, salmon, or sardines.  Buy small amounts of raw or cooked vegetables, salads, or olives from the deli or salad bar at your store.  Stock your pantry so you always have certain foods on hand, such as olive oil, canned tuna, canned tomatoes, rice, pasta, and beans. Cooking  Cook foods with extra-virgin olive oil instead of using butter or other vegetable oils.  Have meat as a side  dish, and have vegetables or grains as your main dish. This means having meat in small portions or adding small amounts of meat to foods like pasta or stew.  Use beans or vegetables instead of meat in common dishes like chili or lasagna.  Experiment with different cooking methods. Try roasting or broiling vegetables instead of steaming or sauteing them.  Add frozen vegetables  to soups, stews, pasta, or rice.  Add nuts or seeds for added healthy fat at each meal. You can add these to yogurt, salads, or vegetable dishes.  Marinate fish or vegetables using olive oil, lemon juice, garlic, and fresh herbs. Meal planning   Plan to eat 1 vegetarian meal one day each week. Try to work up to 2 vegetarian meals, if possible.  Eat seafood 2 or more times a week.  Have healthy snacks readily available, such as: ? Vegetable sticks with hummus. ? [place] yogurt. ? Fruit and nut trail mix.  Eat balanced meals throughout the week. This includes: ? Fruit: 2-3 servings a day ? Vegetables: 4-5 servings a day ? Low-fat dairy: 2 servings a day ? Fish, poultry, or lean meat: 1 serving a day ? Beans and legumes: 2 or more servings a week ? Nuts and seeds: 1-2 servings a day ? Whole grains: 6-8 servings a day ? Extra-virgin olive oil: 3-4 servings a day  Limit red meat and sweets to only a few servings a month What are my food choices?  Mediterranean diet ? Recommended  Grains: Whole-grain pasta. Brown rice. Bulgar wheat. Polenta. Couscous. Whole-wheat bread. [place].  Vegetables: Artichokes. Beets. Broccoli. Cabbage. Carrots. Eggplant. Green beans. Chard. Kale. Spinach. Onions. Leeks. Peas. Squash. Tomatoes. Peppers. Radishes.  Fruits: Apples. Apricots. Avocado. Berries. Bananas. Cherries. Dates. Figs. Grapes. Lemons. Melon. Oranges. Peaches. Plums. Pomegranate.  Meats and other protein foods: Beans. Almonds. Sunflower seeds. Pine nuts. Peanuts. [place]. Salmon. Scallops. Shrimp. [place]. Tilapia. Clams. Oysters. Eggs.  Dairy: Low-fat milk. Cheese. Greek yogurt.  Beverages: Water. Red wine. Herbal tea.  Fats and oils: Extra virgin olive oil. Avocado oil. Grape seed oil.  Sweets and desserts: [place] yogurt with honey. Baked apples. Poached pears. Trail mix.  Seasoning and other foods: Basil. Cilantro. Coriander. Cumin. Mint. Parsley. Sage. Rosemary. Tarragon.  Garlic. Oregano. Thyme. Pepper. Balsalmic vinegar. Tahini. Hummus. Tomato sauce. Olives. Mushrooms. ? Limit these  Grains: Prepackaged pasta or rice dishes. Prepackaged cereal with added sugar.  Vegetables: Deep fried potatoes (french fries).  Fruits: Fruit canned in syrup.  Meats and other protein foods: Beef. Pork. Lamb. Poultry with skin. Hot dogs. Berniece Salines.  Dairy: Ice cream. Sour cream. Whole milk.  Beverages: Juice. Sugar-sweetened soft drinks. Beer. Liquor and spirits.  Fats and oils: Butter. Canola oil. Vegetable oil. Beef fat (tallow). Lard.  Sweets and desserts: Cookies. Cakes. Pies. Candy.  Seasoning and other foods: Mayonnaise. Premade sauces and marinades. The items listed may not be a complete list. Talk with your dietitian about what dietary choices are right for you. Summary  The Mediterranean diet includes both food and lifestyle choices.  Eat a variety of fresh fruits and vegetables, beans, nuts, seeds, and whole grains.  Limit the amount of red meat and sweets that you eat.  Talk with your health care provider about whether it is safe for you to drink red wine in moderation. This means 1 glass a day for nonpregnant women and 2 glasses a day for men. A glass of wine equals 5 oz (150 mL). This information is not intended to  replace advice given to you by your health care provider. Make sure you discuss any questions you have with your health care provider. Document Revised: 01/06/2016 Document Reviewed: 12/30/2015 Elsevier Patient Education  [place].

## 2019-07-31 NOTE — Telephone Encounter (Signed)
LM2CB for virtual appt with Dr. Margaretann Loveless this morning at 8am.

## 2019-07-31 NOTE — Progress Notes (Addendum)
Virtual Visit via Video Note   This visit type was conducted due to national recommendations for restrictions regarding the COVID-19 Pandemic (e.g. social distancing) in an effort to limit this patient's exposure and mitigate transmission in our community.  Due to his co-morbid illnesses, this patient is at least at moderate risk for complications without adequate follow up.  This format is felt to be most appropriate for this patient at this time.  All issues noted in this document were discussed and addressed.  A limited physical exam was performed with this format.  Please refer to the patient's chart for his consent to telehealth for [facility].   The patient was identified using 2 identifiers.  Date:  07/31/2019   ID:  Javier King, DOB Sep 10, 1955, MRN [id number]  Patient Location: Home Provider Location: Home  PCP:  Shelda Pal, DO  Cardiologist:  Elouise Munroe, MD  Electrophysiologist:  None   Evaluation Performed:  Follow-Up Visit  Chief Complaint:  F/u afib  History of Present Illness:    Javier King is a 64 y.o. male with hx of anxiety and depression, musculoskeletal pain, HTN, possible TIA, HLD, pericarditis who presents for follow up of incidentally noted atrial fibrillation. He is feeling much better and musculoskeletal pain has greatly improved. He continues on aspirin and metoprolol. We participated in shared decision making. He feels that his diagnosis of TIA was questionable, and therefore would not meet the threshold for anticoagulation for afib (would only score 1 point for hypertension). He would prefer not to take anticoagulation at this time, however we did discuss that at age 67 he would meet criteria for [facility] and we will discuss it over serial follow up.   No palpitations, no chest pain, no DOE.   The patient does not have symptoms concerning for COVID-19 infection (fever, chills, cough, or new shortness of breath).    Past Medical  History:  Diagnosis Date  . 10 year risk of MI or stroke 7.5% or greater 01/20/2016  . 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 Cancer ([place]) [id number]   skin cancer  . DDD (degenerative disc disease), lumbar   . Depression 01/14/2016  . Erectile dysfunction   . Essential hypertension 01/14/2016  . History of TIA (transient ischemic attack)    per pt/ unsure if had TIA   . Hyperlipidemia   . Pericarditis 1986  . Tubular adenoma of colon    02/17/10   Past Surgical History:  Procedure Laterality Date  . COLONOSCOPY    . OTHER SURGICAL HISTORY  2003,2004   Back surgery/ 2 laminectomies in lower back  . TONSILLECTOMY       Current Meds  Medication Sig  . aspirin EC 81 MG tablet Take 81 mg by mouth daily.  . chlorthalidone (HYGROTON) 25 MG tablet TAKE 1 TABLET DAILY  . cyclobenzaprine (FLEXERIL) 5 MG tablet TAKE 1 TABLET TWICE A DAY AS NEEDED  . metoprolol tartrate (LOPRESSOR) 25 MG tablet Take 1 tablet (25 mg total) by mouth 2 (two) times daily.  . pantoprazole (PROTONIX) 40 MG tablet Take 1 tablet (40 mg total) by mouth daily before breakfast.  . traZODone (DESYREL) 100 MG tablet Take 1 tablet (100 mg total) by mouth at bedtime.  Marland Kitchen zolpidem (AMBIEN) 5 MG tablet Take 1 tablet (5 mg total) by mouth at bedtime as needed for sleep.  . [DISCONTINUED] lisinopril (PRINIVIL,ZESTRIL) 40 MG tablet TAKE 1 TABLET DAILY  . [DISCONTINUED] tadalafil (CIALIS) 20 MG tablet  Take 0.5-1 tablets (10-20 mg total) by mouth every other day as needed for erectile dysfunction.     Allergies:   Penicillins   Social History   Tobacco Use  . Smoking status: Never Smoker  . Smokeless tobacco: Never Used  Substance Use Topics  . Alcohol use: Yes    Alcohol/week: 3.0 standard drinks    Types: 1 Glasses of wine, 1 Cans of beer, 1 Shots of liquor per week    Comment: occassionall  . Drug use: No     Family Hx: The patient's family history includes Alzheimer's disease in his father; Diabetes in  his sister; Heart disease in his mother; Leukemia in his sister; Parkinsonism in his father.  ROS:   Please see the history of present illness.     All other systems reviewed and are negative.   Prior CV studies:   The following studies were reviewed today:    Labs/Other Tests and Data Reviewed:    EKG:  No ECG reviewed.  Recent Labs: 06/18/2019: TSH 1.35 08/12/2019: ALT 43; BUN 15; Creatinine, Ser 1.20; Hemoglobin 13.8; Platelets 215.0; Potassium 4.4; Sodium 134   Recent Lipid Panel Lab Results  Component Value Date/Time   CHOL 204 (H) 08/12/2019 08:26 AM   TRIG 163.0 (H) 08/12/2019 08:26 AM   HDL 40.90 08/12/2019 08:26 AM   CHOLHDL 5 08/12/2019 08:26 AM   LDLCALC 130 (H) 08/12/2019 08:26 AM    Wt Readings from Last 3 Encounters:  08/12/19 295 lb (133.8 kg)  07/31/19 285 lb (129.3 kg)  07/22/19 289 lb (131.1 kg)     Objective:    Vital Signs:  BP 125/80   Pulse 65   Ht 6\' 1"  (1.854 m)   Wt 285 lb (129.3 kg)   BMI 37.60 kg/m    VITAL SIGNS:  reviewed GEN:  no acute distress EYES:  sclerae anicteric, EOMI - Extraocular Movements Intact RESPIRATORY:  normal respiratory effort, symmetric expansion CARDIOVASCULAR:  no peripheral edema SKIN:  no rash, lesions or ulcers. MUSCULOSKELETAL:  no obvious deformities. NEURO:  alert and oriented x 3, no obvious focal deficit PSYCH:  normal affect  ASSESSMENT & PLAN:    PAF - His ecg demonstrated afib at his PCP office. Currently in sinus rhythm. Discussed initiation of eliquis 5 mg BID for anticoagulation in afib, however we participated in shared decision making. Patient feels his dx of TIA is questionable, and therefore he would only have a chadsvasc score of 1. We discussed that ASA is inadequate anticoagulation for atrial fibrillation, and true [facility] is indicated for chadsvasc > 2. For now we will defer [facility] per his preference. Continue metoprolol 25 mg BID.   HLD - not currently on lipid lowering therapy, and lipids are  not optimized. He will have his physical on March 23 with labs then, will follow.  HTN - on chlorthalidone, lisinopril and now metoprolol. Continue, as BP is at goal. Counseled on use of tadalafil and antihypertensive medications.  TIA - on ASA 81 mg daily.  Elevated cr - advised on use advil pm, NSAIDs and renal dysfunction.   COVID-19 Education: The signs and symptoms of COVID-19 were discussed with the patient and how to seek care for testing (follow up with PCP or arrange E-visit).  The importance of social distancing was discussed today.  Time:   Today, I have spent 25 minutes with the patient with telehealth technology discussing the above problems.     Medication Adjustments/Labs and Tests Ordered: Current medicines  are reviewed at length with the patient today.  Concerns regarding medicines are outlined above.   Tests Ordered: No orders of the defined types were placed in this encounter.   Medication Changes: No orders of the defined types were placed in this encounter.   Follow Up:  6 mo  Signed, Elouise Munroe, MD  07/31/2019 11:08 AM    [place]

## 2019-08-11 ENCOUNTER — Encounter: Payer: Self-pay | Admitting: Internal Medicine

## 2019-08-11 ENCOUNTER — Other Ambulatory Visit: Payer: Self-pay

## 2019-08-12 ENCOUNTER — Ambulatory Visit (INDEPENDENT_AMBULATORY_CARE_PROVIDER_SITE_OTHER): Payer: BC Managed Care – PPO | Admitting: Family Medicine

## 2019-08-12 ENCOUNTER — Encounter: Payer: Self-pay | Admitting: Family Medicine

## 2019-08-12 ENCOUNTER — Other Ambulatory Visit: Payer: Self-pay

## 2019-08-12 VITALS — BP 112/76 | HR 85 | Temp 95.3°F | Ht 73.0 in | Wt 295.0 lb

## 2019-08-12 DIAGNOSIS — I10 Essential (primary) hypertension: Secondary | ICD-10-CM | POA: Diagnosis not present

## 2019-08-12 DIAGNOSIS — Z0000 Encounter for general adult medical examination without abnormal findings: Secondary | ICD-10-CM

## 2019-08-12 LAB — CBC
HCT: 40.1 % (ref 39.0–52.0)
Hemoglobin: 13.8 g/dL (ref 13.0–17.0)
MCHC: 34.4 g/dL (ref 30.0–36.0)
MCV: 93.3 fl (ref 78.0–100.0)
Platelets: 215 10*3/uL (ref 150.0–400.0)
RBC: 4.3 Mil/uL (ref 4.22–5.81)
RDW: 13.5 % (ref 11.5–15.5)
WBC: 5.9 10*3/uL (ref 4.0–10.5)

## 2019-08-12 LAB — COMPREHENSIVE METABOLIC PANEL
ALT: 43 U/L (ref 0–53)
AST: 26 U/L (ref 0–37)
Albumin: 4 g/dL (ref 3.5–5.2)
Alkaline Phosphatase: 39 U/L (ref 39–117)
BUN: 15 mg/dL (ref 6–23)
CO2: 29 mEq/L (ref 19–32)
Calcium: 9.4 mg/dL (ref 8.4–10.5)
Chloride: 99 mEq/L (ref 96–112)
Creatinine, Ser: 1.2 mg/dL (ref 0.40–1.50)
GFR: 61.45 mL/min (ref >60.00)
Glucose, Bld: 98 mg/dL (ref 70–99)
Potassium: 4.4 mEq/L (ref 3.5–5.1)
Sodium: 134 mEq/L — ABNORMAL LOW (ref 135–145)
Total Bilirubin: 0.8 mg/dL (ref 0.2–1.2)
Total Protein: 6.3 g/dL (ref 6.0–8.3)

## 2019-08-12 LAB — LIPID PANEL
Cholesterol: 204 mg/dL — ABNORMAL HIGH (ref 0–200)
HDL: 40.9 mg/dL (ref >39.00)
LDL Cholesterol: 130 mg/dL — ABNORMAL HIGH (ref 0–99)
NonHDL: 162.87
Total CHOL/HDL Ratio: 5
Triglycerides: 163 mg/dL — ABNORMAL HIGH (ref 0.0–149.0)
VLDL: 32.6 mg/dL (ref 0.0–40.0)

## 2019-08-12 MED ORDER — LISINOPRIL 40 MG PO TABS: 40.0000 mg | ORAL_TABLET | Freq: Every day | ORAL | 3 refills | Status: DC

## 2019-08-12 NOTE — Progress Notes (Signed)
Chief Complaint  Patient presents with  . Annual Exam    Well Male Javier King is here for a complete physical.   His last physical was >1 year ago.  Current diet: in general, a "healthier" diet.  Current exercise: walking, cycling Weight trend: has gone up a little Daytime fatigue? No. Seat belt? Yes.    Health maintenance Shingrix- Yes Colonoscopy- Due; going to call when a good time to schedule Tetanus- Yes HIV- Yes Hep C- Yes   Past Medical History:  Diagnosis Date  . 10 year risk of MI or stroke 7.5% or greater 01/20/2016  . 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 Cancer ([place]) [id number]   skin cancer  . DDD (degenerative disc disease), lumbar   . Depression 01/14/2016  . Erectile dysfunction   . Essential hypertension 01/14/2016  . History of TIA (transient ischemic attack)    per pt/ unsure if had TIA   . Hyperlipidemia   . Pericarditis 1986  . Tubular adenoma of colon    02/17/10      Past Surgical History:  Procedure Laterality Date  . COLONOSCOPY    . OTHER SURGICAL HISTORY  2003,2004   Back surgery/ 2 laminectomies in lower back  . TONSILLECTOMY      Medications  Current Outpatient Medications on File Prior to Visit  Medication Sig Dispense Refill  . aspirin EC 81 MG tablet Take 81 mg by mouth daily.    . chlorthalidone (HYGROTON) 25 MG tablet TAKE 1 TABLET DAILY 90 tablet 3  . cyclobenzaprine (FLEXERIL) 5 MG tablet TAKE 1 TABLET TWICE A DAY AS NEEDED 180 tablet 3  . lisinopril (PRINIVIL,ZESTRIL) 40 MG tablet TAKE 1 TABLET DAILY 90 tablet 4  . metoprolol tartrate (LOPRESSOR) 25 MG tablet Take 1 tablet (25 mg total) by mouth 2 (two) times daily. 180 tablet 1  . pantoprazole (PROTONIX) 40 MG tablet Take 1 tablet (40 mg total) by mouth daily before breakfast. 90 tablet 1  . tadalafil (CIALIS) 20 MG tablet Take 0.5-1 tablets (10-20 mg total) by mouth every other day as needed for erectile dysfunction. 5 tablet 1  . traZODone (DESYREL) 100 MG  tablet Take 1 tablet (100 mg total) by mouth at bedtime. 90 tablet 2  . zolpidem (AMBIEN) 5 MG tablet Take 1 tablet (5 mg total) by mouth at bedtime as needed for sleep. 90 tablet 0   Allergies Allergies  Allergen Reactions  . Penicillins Itching    Family History Family History  Problem Relation Age of Onset  . Heart disease Mother   . Diabetes Sister   . Alzheimer's disease Father   . Parkinsonism Father   . Leukemia Sister     Review of Systems: Constitutional:  no fevers Eye:  no recent significant change in vision Ear/Nose/Mouth/Throat:  Ears:  no hearing loss Nose/Mouth/Throat:  no complaints of nasal congestion, no sore throat Cardiovascular:  no chest pain, no palpitations Respiratory:  no cough and no shortness of breath Gastrointestinal:  no abdominal pain, no change in bowel habits GU:  Male: negative for dysuria, frequency, and incontinence and negative for prostate symptoms Musculoskeletal/Extremities:  no new pain, redness, or swelling of the joints Integumentary (Skin/Breast):  no abnormal skin lesions reported Neurologic:  no headaches Endocrine: No unexpected weight changes Hematologic/Lymphatic:  no abnormal bleeding  Exam BP 112/76 (BP Location: Left Arm, Patient Position: Sitting, Cuff Size: Large)   Pulse 85   Temp (!) 95.3 F (35.2 C) (Temporal)   Ht  6\' 1"  (1.854 m)   Wt 295 lb (133.8 kg)   SpO2 96%   BMI 38.92 kg/m  General:  well developed, well nourished, in no apparent distress Skin:  no significant moles, warts, or growths Head:  no masses, lesions, or tenderness Eyes:  pupils equal and round, sclera anicteric without injection Ears:  canals without lesions, TMs shiny without retraction, no obvious effusion, no erythema Nose:  nares patent, septum midline, mucosa normal Throat/Pharynx:  lips and gingiva without lesion; tongue and uvula midline; non-inflamed pharynx; no exudates or postnasal drainage Neck: neck supple without adenopathy,  thyromegaly, or masses Cardiac: RRR, no bruits, 2+ pitting b/l LE edema tapering at prox 1/3 of tibia Lungs:  clear to auscultation, breath sounds equal bilaterally, no respiratory distress Rectal: Deferred Musculoskeletal:  symmetrical muscle groups noted without atrophy or deformity Neuro:  gait normal; deep tendon reflexes normal and symmetric Psych: well oriented with normal range of affect and appropriate judgment/insight  Assessment and Plan  Well adult exam - Plan: CBC, Comprehensive metabolic panel, Lipid panel  Essential hypertension - Plan: lisinopril (ZESTRIL) 40 MG tablet   Well 64 y.o. male. Counseled on diet and exercise. Counseled on risks and benefits of prostate cancer screening with PSA. The patient agrees to forego testing. Will sched colonoscopy when convenient with a ride. GI team has already reached out to him.  Immunizations, labs, and further orders as above. Follow up in 6 mo or prn. The patient voiced understanding and agreement to the plan.  [place], DO 08/12/19 8:26 AM

## 2019-08-12 NOTE — Patient Instructions (Signed)
Give [place] 2-3 business days to get the results of your labs back.   OK to use Debrox (peroxide) in the ear to loosen up wax. Also recommend using a bulb syringe (for removing boogers from baby's noses) to flush through warm water. Do not use Q-tips as this can impact wax further.  Keep the diet clean and stay active.  Let us know if you need anything.

## 2019-11-11 ENCOUNTER — Other Ambulatory Visit: Payer: Self-pay | Admitting: Family Medicine

## 2019-12-04 ENCOUNTER — Telehealth: Payer: Self-pay | Admitting: Internal Medicine

## 2019-12-04 NOTE — Telephone Encounter (Signed)
LVM for patient to return to get scheduled with Margaretann Loveless from recall list

## 2019-12-10 ENCOUNTER — Other Ambulatory Visit: Payer: Self-pay | Admitting: Family Medicine

## 2019-12-10 DIAGNOSIS — G4700 Insomnia, unspecified: Secondary | ICD-10-CM

## 2019-12-22 ENCOUNTER — Other Ambulatory Visit: Payer: Self-pay | Admitting: Family Medicine

## 2020-02-09 ENCOUNTER — Encounter: Payer: Self-pay | Admitting: Internal Medicine

## 2020-02-09 ENCOUNTER — Ambulatory Visit (INDEPENDENT_AMBULATORY_CARE_PROVIDER_SITE_OTHER): Payer: BC Managed Care – PPO | Admitting: Internal Medicine

## 2020-02-09 ENCOUNTER — Other Ambulatory Visit: Payer: Self-pay

## 2020-02-09 VITALS — BP 128/80 | HR 68 | Temp 96.8°F | Ht 73.0 in | Wt 279.0 lb

## 2020-02-09 DIAGNOSIS — G459 Transient cerebral ischemic attack, unspecified: Secondary | ICD-10-CM

## 2020-02-09 DIAGNOSIS — F418 Other specified anxiety disorders: Secondary | ICD-10-CM

## 2020-02-09 DIAGNOSIS — I480 Paroxysmal atrial fibrillation: Secondary | ICD-10-CM

## 2020-02-09 DIAGNOSIS — R0600 Dyspnea, unspecified: Secondary | ICD-10-CM

## 2020-02-09 DIAGNOSIS — E782 Mixed hyperlipidemia: Secondary | ICD-10-CM

## 2020-02-09 DIAGNOSIS — I10 Essential (primary) hypertension: Secondary | ICD-10-CM | POA: Diagnosis not present

## 2020-02-09 DIAGNOSIS — R0609 Other forms of dyspnea: Secondary | ICD-10-CM

## 2020-02-09 NOTE — Patient Instructions (Signed)
Medication Instructions:  No Changes In Medications at this time.   *If you need a refill on your cardiac medications before your next appointment, please call your pharmacy*  Lab Work: None Ordered At This Time.  If you have labs (blood work) drawn today and your tests are completely normal, you will receive your results only by: Marland Kitchen MyChart Message (if you have MyChart) OR . A paper copy in the mail If you have any lab test that is abnormal or we need to change your treatment, we will call you to review the results.  Testing/Procedures: None Ordered At This Time.   Follow-Up: At [facility], you and your health needs are our priority.  As part of our continuing mission to provide you with exceptional heart care, we have created designated Provider Care Teams.  These Care Teams include your primary Cardiologist (physician) and Advanced Practice Providers (APPs -  Physician Assistants and Nurse Practitioners) who all work together to provide you with the care you need, when you need it.  Your next appointment:   6 month(s)  The format for your next appointment:   In Person  Provider:   Cherlynn Kaiser, MD   Other Instructions PLEASE [place] TEST OR CORONARY CTA.

## 2020-02-09 NOTE — Progress Notes (Addendum)
Cardiology Office Note:    Date:  02/09/2020   ID:  Javier King, DOB 05/26/1955, MRN [id number]  PCP:  Shelda Pal, DO  Cardiologist:  Elouise Munroe, MD  Electrophysiologist:  None   Referring MD: Shelda Pal*   Chief Complaint/Reason for Referral: afib  History of Present Illness:    Javier King is a 64 y.o. male with a history of anxiety and depression, musculoskeletal pain, HTN, possible TIA, HLD, pericarditis who presents for follow up of incidentally noted atrial fibrillation. He is feeling much better and musculoskeletal pain has greatly improved. He continues on aspirin and metoprolol. We participated in shared decision making. He feels that his diagnosis of TIA was questionable, and therefore would not meet the threshold for anticoagulation for afib (would only score 1 point for hypertension). He would prefer not to take anticoagulation at this time, however we did discuss that at age 3 he would meet criteria for [facility] and we will discuss it over serial follow up.   Describes increasing dyspnea on exertion. Feels he has greatly improved his diet recently, and it appears he has lost nearly 20 lbs since our last visit. He has not however been able to be as active with a busy work schedule. Describes upper left chest pain. He had an episodes this weekend where he was walking down a long outdoor set of stairs and on his walk he was significantly and disproportionately short of breath. 1 minute recovery. He describes this as a feeling as if he "doesn't have his wind".  Past Medical History:  Diagnosis Date  . 10 year risk of MI or stroke 7.5% or greater 01/20/2016  . 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 Cancer ([place]) [id number]   skin cancer  . DDD (degenerative disc disease), lumbar   . Depression 01/14/2016  . Erectile dysfunction   . Essential hypertension 01/14/2016  . History of TIA (transient ischemic attack)    per pt/ unsure if  had TIA   . Hyperlipidemia   . Pericarditis 1986  . Tubular adenoma of colon    02/17/10    Past Surgical History:  Procedure Laterality Date  . COLONOSCOPY    . OTHER SURGICAL HISTORY  2003,2004   Back surgery/ 2 laminectomies in lower back  . TONSILLECTOMY      Current Medications: No outpatient medications have been marked as taking for the 02/09/20 encounter (Appointment) with Elouise Munroe, MD.     Allergies:   Penicillins   Social History   Tobacco Use  . Smoking status: Never Smoker  . Smokeless tobacco: Never Used  Substance Use Topics  . Alcohol use: Yes    Alcohol/week: 3.0 standard drinks    Types: 1 Glasses of wine, 1 Cans of beer, 1 Shots of liquor per week    Comment: occassionall  . Drug use: No     Family History: The patient's family history includes Alzheimer's disease in his father; Diabetes in his sister; Heart disease in his mother; Leukemia in his sister; Parkinsonism in his father.  ROS:   Please see the history of present illness.    All other systems reviewed and are negative.  EKGs/Labs/Other Studies Reviewed:    The following studies were reviewed today:  EKG:  NSR  Recent Labs: 06/18/2019: TSH 1.35 08/12/2019: ALT 43; BUN 15; Creatinine, Ser 1.20; Hemoglobin 13.8; Platelets 215.0; Potassium 4.4; Sodium 134  Recent Lipid Panel    Component Value Date/Time   CHOL  204 (H) 08/12/2019 0826   TRIG 163.0 (H) 08/12/2019 0826   HDL 40.90 08/12/2019 0826   CHOLHDL 5 08/12/2019 0826   VLDL 32.6 08/12/2019 0826   LDLCALC 130 (H) 08/12/2019 0826    Physical Exam:    VS:  BP 128/80   Pulse 68   Temp (!) 96.8 F (36 C)   Ht 6\' 1"  (1.854 m)   Wt 279 lb (126.6 kg)   SpO2 97%   BMI 36.81 kg/m     Wt Readings from Last 5 Encounters:  08/12/19 295 lb (133.8 kg)  07/31/19 285 lb (129.3 kg)  07/22/19 289 lb (131.1 kg)  06/27/19 279 lb 12.8 oz (126.9 kg)  06/20/19 289 lb 3.2 oz (131.2 kg)    Constitutional: No acute distress Eyes:  sclera non-icteric, normal conjunctiva and lids ENMT: normal dentition, moist mucous membranes Cardiovascular: regular rhythm, normal rate, no murmurs. S1 and S2 normal. Radial pulses normal bilaterally. No jugular venous distention.  Respiratory: clear to auscultation bilaterally GI : normal bowel sounds, soft and nontender. No distention.   MSK: extremities warm, well perfused. No edema.  NEURO: grossly nonfocal exam, moves all extremities. PSYCH: alert and oriented x 3, normal mood and affect.   ASSESSMENT:    1. Dyspnea on exertion   2. Paroxysmal atrial fibrillation (HCC)   3. Mixed hyperlipidemia   4. Essential hypertension   5. Anxiety associated with depression   6. TIA (transient ischemic attack)    PLAN:    DOE - Discussed in detail. I would recommend coronary CTA to further evaluate for obstructive CAD given obesity, HTN and hyperlipidemia. Could consider ETT, however per SCCT guidelines, CCTA appropriate in this situation given intermediate risk of ascvd, and will provide additional information about plaque burden. He would like to Dr. Nani Ravens about this first, who he will see soon.  Paroxysmal atrial fibrillation (HCC) - Plan: EKG 12-Lead - no recurrence. No AC per shared decision making and strongly preferred by patient, as he feels his TIA was not truly an ischemic event.   Mixed hyperlipidemia - Plan: EKG 12-Lead -plans for repeat lipids with his pcp upcoming. If LDL >70 despite dietary changes, would recommend initiating statin, crestor 5 mg or atorvastatin 10 mg to start.   Essential hypertension - Plan: EKG 12-Lead - well controlled on chlorthalidone, lisinopril, metoprolol. Continue.    Total time of encounter: 30 minutes total time of encounter, including 25 minutes spent in face-to-face patient care on the date of this encounter. This time includes coordination of care and counseling regarding above mentioned problem list. Remainder of non-face-to-face time  involved reviewing chart documents/testing relevant to the patient encounter and documentation in the medical record. I have independently reviewed documentation from referring provider.   Cherlynn Kaiser, MD [place]  [facility]    Medication Adjustments/Labs and Tests Ordered: Current medicines are reviewed at length with the patient today.  Concerns regarding medicines are outlined above.   Orders Placed This Encounter  Procedures  . EKG 12-Lead    No orders of the defined types were placed in this encounter.   Patient Instructions  Medication Instructions:  No Changes In Medications at this time.   *If you need a refill on your cardiac medications before your next appointment, please call your pharmacy*  Lab Work: None Ordered At This Time.  If you have labs (blood work) drawn today and your tests are completely normal, you will receive your results only by: Marland Kitchen MyChart Message (if  you have MyChart) OR . A paper copy in the mail If you have any lab test that is abnormal or we need to change your treatment, we will call you to review the results.  Testing/Procedures: None Ordered At This Time.   Follow-Up: At [facility], you and your health needs are our priority.  As part of our continuing mission to provide you with exceptional heart care, we have created designated Provider Care Teams.  These Care Teams include your primary Cardiologist (physician) and Advanced Practice Providers (APPs -  Physician Assistants and Nurse Practitioners) who all work together to provide you with the care you need, when you need it.  Your next appointment:   6 month(s)  The format for your next appointment:   In Person  Provider:   Cherlynn Kaiser, MD   Other Instructions PLEASE [place] TEST OR CORONARY CTA.

## 2020-02-13 ENCOUNTER — Ambulatory Visit: Payer: BC Managed Care – PPO | Admitting: Family Medicine

## 2020-02-20 ENCOUNTER — Ambulatory Visit: Payer: BC Managed Care – PPO | Admitting: Family Medicine

## 2020-02-27 ENCOUNTER — Encounter: Payer: Self-pay | Admitting: Family Medicine

## 2020-02-27 ENCOUNTER — Ambulatory Visit (INDEPENDENT_AMBULATORY_CARE_PROVIDER_SITE_OTHER): Payer: BC Managed Care – PPO | Admitting: Family Medicine

## 2020-02-27 ENCOUNTER — Other Ambulatory Visit: Payer: Self-pay

## 2020-02-27 VITALS — BP 128/68 | HR 74 | Temp 98.5°F | Ht 73.0 in | Wt 295.2 lb

## 2020-02-27 DIAGNOSIS — Z23 Encounter for immunization: Secondary | ICD-10-CM | POA: Diagnosis not present

## 2020-02-27 DIAGNOSIS — F418 Other specified anxiety disorders: Secondary | ICD-10-CM | POA: Diagnosis not present

## 2020-02-27 DIAGNOSIS — I10 Essential (primary) hypertension: Secondary | ICD-10-CM | POA: Diagnosis not present

## 2020-02-27 DIAGNOSIS — E782 Mixed hyperlipidemia: Secondary | ICD-10-CM | POA: Diagnosis not present

## 2020-02-27 NOTE — Progress Notes (Signed)
Chief Complaint  Patient presents with  . Follow-up  . Shortness of Breath    Subjective Javier King is a 64 y.o. male who presents for hypertension follow up. He does not routinely monitor home blood pressures. He is compliant with medications- lisinopril 40 mg, chlorthalidone 25 mg/d, metoprolol 25 mg bid Patient has these side effects of medication: none He is usually adhering to a healthy diet overall. Current exercise: walking While he has no chest pain, he is having dyspnea on exertion.  He saw his cardiologist and has a stress test ordered.  Depression/anxiety Patient is currently taking trazodone 100 mg nightly and tolerating well.  No adverse effects.  He also takes Ambien 5 mg nightly as needed.  He does not take this every night.  He is weaned down from 10 mg nightly.  He is not following with a counselor or psychologist at this time.  No self-medication.  No homicidal or suicidal ideation.   Past Medical History:  Diagnosis Date  . 10 year risk of MI or stroke 7.5% or greater 01/20/2016  . 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 Cancer ([place]) [id number]   skin cancer  . DDD (degenerative disc disease), lumbar   . Depression 01/14/2016  . Erectile dysfunction   . Essential hypertension 01/14/2016  . History of TIA (transient ischemic attack)    per pt/ unsure if had TIA   . Hyperlipidemia   . Pericarditis 1986  . Tubular adenoma of colon    02/17/10    Exam BP 128/68 (BP Location: Left Arm, Patient Position: Sitting, Cuff Size: Large)   Pulse 74   Temp 98.5 F (36.9 C) (Oral)   Ht 6\' 1"  (1.854 m)   Wt 295 lb 4 oz (133.9 kg)   SpO2 97%   BMI 38.95 kg/m  General:  well developed, well nourished, in no apparent distress Heart: RRR, no bruits, 2+ pitting edema tapering at the mid tibia bilaterally Lungs: clear to auscultation, no accessory muscle use Psych: well oriented with normal range of affect and appropriate judgment/insight  Essential  hypertension  Anxiety associated with depression  Mixed hyperlipidemia - Plan: Lipid panel  Need for influenza vaccination - Plan: Flu Vaccine QUAD 6+ mos PF IM (Fluarix Quad PF)  1.  Counseled on diet and exercise.  He is start moving again.  Continue chlorthalidone 25 mg daily, lisinopril 40 mg daily, and metoprolol 25 mg twice daily.  Might not be a bad idea to check blood pressure intermittently. 2.  Continue trazodone 100 mg daily.  He does have my contact information for the counseling team should he decide he wants to do this.  Does not wish to change his medication at this time.  Continue Ambien 5 mg nightly as needed. 3.  Follow-up on lipid panel, would likely recommend statin if no improvement. F/u in 6 months for a physical or as needed. The patient voiced understanding and agreement to the plan.  [place], DO 02/27/20  5:11 PM

## 2020-02-27 NOTE — Patient Instructions (Signed)
Use Compound W again. Consider adding aspirin paste after it is down from the Compound W.  Give [place] 2-3 business days to get the results of your labs back.   Keep the diet clean and stay active.  Start walking again.  Aim to do some physical exertion for 150 minutes per week. This is typically divided into 5 days per week, 30 minutes per day. The activity should be enough to get your heart rate up. Anything is better than nothing if you have time constraints.  Let us know if you need anything.

## 2020-03-04 ENCOUNTER — Other Ambulatory Visit: Payer: Self-pay

## 2020-03-04 ENCOUNTER — Other Ambulatory Visit (INDEPENDENT_AMBULATORY_CARE_PROVIDER_SITE_OTHER): Payer: BC Managed Care – PPO

## 2020-03-04 DIAGNOSIS — E782 Mixed hyperlipidemia: Secondary | ICD-10-CM | POA: Diagnosis not present

## 2020-03-05 LAB — LIPID PANEL
Cholesterol: 201 mg/dL — ABNORMAL HIGH (ref <200)
HDL: 39 mg/dL — ABNORMAL LOW (ref >=40)
LDL Cholesterol (Calc): 134 mg/dL (calc) — ABNORMAL HIGH
Non-HDL Cholesterol (Calc): 162 mg/dL (calc) — ABNORMAL HIGH (ref <130)
Total CHOL/HDL Ratio: 5.2 (calc) — ABNORMAL HIGH (ref <5.0)
Triglycerides: 150 mg/dL — ABNORMAL HIGH (ref <150)

## 2020-03-08 ENCOUNTER — Other Ambulatory Visit: Payer: Self-pay | Admitting: Family Medicine

## 2020-03-08 ENCOUNTER — Telehealth: Payer: Self-pay | Admitting: Internal Medicine

## 2020-03-08 DIAGNOSIS — E782 Mixed hyperlipidemia: Secondary | ICD-10-CM

## 2020-03-08 DIAGNOSIS — R072 Precordial pain: Secondary | ICD-10-CM

## 2020-03-08 DIAGNOSIS — R0609 Other forms of dyspnea: Secondary | ICD-10-CM

## 2020-03-08 MED ORDER — ROSUVASTATIN CALCIUM 20 MG PO TABS: 20.0000 mg | ORAL_TABLET | Freq: Every day | ORAL | 3 refills | Status: DC

## 2020-03-08 NOTE — Telephone Encounter (Signed)
Spoke with patient regarding ordering CTA. Advised patient I would place order for this. Spoke with patient about a good time to go over instructions for this. Patient stated tomorrow at 11am would be ideal. Advised patient I would call tomorrow morning at 11am to go over instructions for CTA. Patient verbalized understanding.

## 2020-03-08 NOTE — Progress Notes (Signed)
Lipid panel 

## 2020-03-08 NOTE — Telephone Encounter (Signed)
Patient is requesting to have a coronary CTA order placed.

## 2020-03-08 NOTE — Telephone Encounter (Signed)
Patient states that last time he was seen by Dr. Margaretann Loveless, she mentioned ordering a coronary CTA but he was hesitant. He has now done his own research and is ready for the procedure. He is also requesting a one time dose of Valium for day of procedure if possible. Will route to Dr. Hamilton Capri RN for follow up.

## 2020-03-09 NOTE — Telephone Encounter (Signed)
Spoke with patient to go over CTA instructions. Patient made aware of the following   Advised patient he would have to come in 7 days prior to his CTA to have blood work drawn. Order has been placed.   Patient currently taking Metoprolol Tartrate 42m twice daily- advised patient to take 521m2 hours prior to CTA   Patient currently taking Sildenafil as needed for Ed. Advised patient to hold this medication 72 hours prior to CTA.   Patient verbalized understanding of all instructions. MyChart message sent to patient with all instructions.   Your cardiac CT will be scheduled at one of the below locations:   [facility] [phone] minutes prior to test start time. [facility] (first floor) to check-in and test prep.  Please follow these instructions carefully (unless otherwise directed):  Hold all erectile dysfunction medications at least 3 days (72 hrs) prior to test.  On the Night Before the Test:  Be sure to Drink plenty of water.  Do not consume any caffeinated/decaffeinated beverages or chocolate 12 hours prior to your test.  Do not take any antihistamines 12 hours prior to your test.  On the Day of the Test:  Drink plenty of water. Do not drink any water within one hour of the test.  Do not eat any food 4 hours prior to the test.  You may take your regular medications prior to the test.   Take 50 mg (2 tablets) metoprolol (Lopressor) two hours prior to test.  After the Test:  Drink plenty of water.  After receiving IV contrast, you may experience a mild flushed feeling. This is normal.  On occasion, you may experience a mild rash up to 24 hours after the test. This is not dangerous. If this occurs, you can take Benadryl 25 mg and increase your fluid intake.  If  you experience trouble breathing, this can be serious. If it is severe call 911 IMMEDIATELY. If it is mild, please call our office.  If you take any of these medications: Glipizide/Metformin, Avandament, Glucavance, please do not take 48 hours after completing test unless otherwise instructed.  Once we have confirmed authorization from your insurance company, we will call you to set up a date and time for your test. Based on how quickly your insurance processes prior authorizations requests, please allow up to 4 weeks to be contacted for scheduling your Cardiac CT appointment. Be advised that routine Cardiac CT appointments could be scheduled as many as 8 weeks after your provider has ordered it.  For non-scheduling related questions, please contact the cardiac imaging nurse navigator should you have any questions/concerns: SaMarchia BondCardiac Imaging Nurse Navigator MeBurley SaverInterim Cardiac Imaging Nurse [facility] Direct Office Dial: [phone] For scheduling needs, including cancellations and rescheduling, please call ToVivien Rotat [phone].

## 2020-03-19 DIAGNOSIS — R072 Precordial pain: Secondary | ICD-10-CM | POA: Diagnosis not present

## 2020-03-19 DIAGNOSIS — R0600 Dyspnea, unspecified: Secondary | ICD-10-CM | POA: Diagnosis not present

## 2020-03-20 LAB — BASIC METABOLIC PANEL
BUN/Creatinine Ratio: 12 (ref 10–24)
BUN: 14 mg/dL (ref 8–27)
CO2: 25 mmol/L (ref 20–29)
Calcium: 10.2 mg/dL (ref 8.6–10.2)
Chloride: 93 mmol/L — ABNORMAL LOW (ref 96–106)
Creatinine, Ser: 1.2 mg/dL (ref 0.76–1.27)
GFR calc Af Amer: 74 mL/min/{1.73_m2} (ref >59)
GFR calc non Af Amer: 64 mL/min/{1.73_m2} (ref >59)
Glucose: 86 mg/dL (ref 65–99)
Potassium: 4.2 mmol/L (ref 3.5–5.2)
Sodium: 134 mmol/L (ref 134–144)

## 2020-03-24 ENCOUNTER — Telehealth (HOSPITAL_COMMUNITY): Payer: Self-pay | Admitting: Emergency Medicine

## 2020-03-24 NOTE — Telephone Encounter (Signed)
Reaching out to patient to offer assistance regarding upcoming cardiac imaging study; pt verbalizes understanding of appt date/time, parking situation and where to check in, pre-test NPO status and medications ordered, and verified current allergies; name and call back number provided for further questions should they arise Hila Bolding RN Navigator Cardiac Imaging [facility] [phone] office [phone] cell 

## 2020-03-26 ENCOUNTER — Other Ambulatory Visit: Payer: Self-pay

## 2020-03-26 ENCOUNTER — Ambulatory Visit (HOSPITAL_COMMUNITY)
Admission: RE | Admit: 2020-03-26 | Discharge: 2020-03-26 | Disposition: A | Discharge status: Home / Self Care | Payer: BC Managed Care – PPO | Source: Ambulatory Visit | Attending: Internal Medicine | Admitting: Internal Medicine

## 2020-03-26 DIAGNOSIS — R072 Precordial pain: Secondary | ICD-10-CM | POA: Diagnosis not present

## 2020-03-26 IMAGING — CT CT HEART MORP W/ CTA COR W/ SCORE W/ CA W/CM &/OR W/O CM
4 of 7 series · 8 of 20 positions shown, 9 images · IV contrast (APPLIED)
Comparison: None.
COMPARISON: None.

Addendum:
EXAM:
OVER-READ INTERPRETATION  CT CHEST

The following report is an over-read performed by radiologist Dr.
MASLAKOV [REDACTED] on [DATE]. This
over-read does not include interpretation of cardiac or coronary
anatomy or pathology. The coronary calcium score/coronary CTA
interpretation by the cardiologist is attached.
HISTORY: Chest pain, nonspecific Dyspnea on exertion (MASLAKOV)
Cardiac/Coronary  CT
TECHNIQUE: The patient was scanned on a Siemens Force scanner.
PROTOCOL: A 120 kV prospective scan was triggered in the descending thoracic
aorta at 111 HU's. Axial non-contrast 3 mm slices were carried out
through the heart. The data set was analyzed on a dedicated work
station and scored using the Agatson method. Gantry rotation speed
was 250 msecs and collimation was .6 mm. Beta blockade and 0.8 mg of
sl NTG was given. The 3D data set was reconstructed in 5% intervals
of the 67-82 % of the R-R cycle. Diastolic phases were analyzed on a
dedicated work station using MPR, MIP and VRT modes. The patient
received 100mL OMNIPAQUE IOHEXOL 350 MG/ML SOLN of contrast.

[Series 6: best diast 70 % · axial · 0.39mm/px · z∈[+1117,+1161]mm · 2 of 333 slices shown, 3 images]
[im 111/333  vessel]
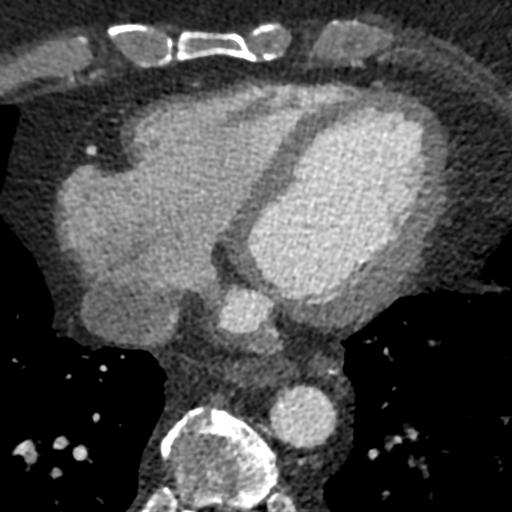
[im 111/333  lung]
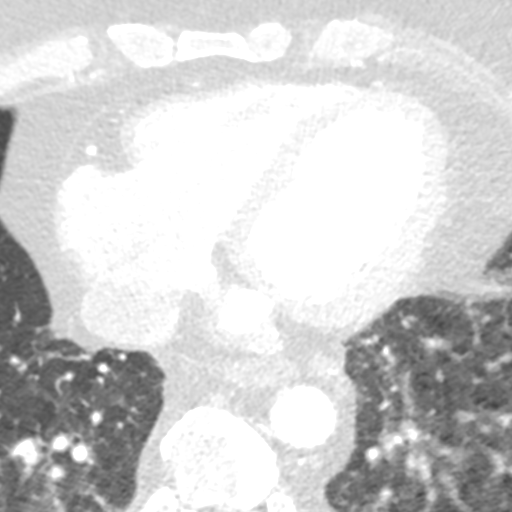
[im 222/333  vessel]
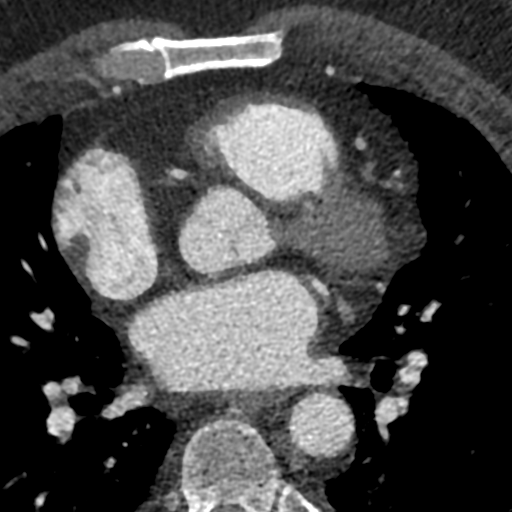

[Series 7: best syst 37 % · axial · 0.39mm/px · z∈[+1117,+1161]mm · 2 of 333 slices shown]
[im 111/333  vessel]
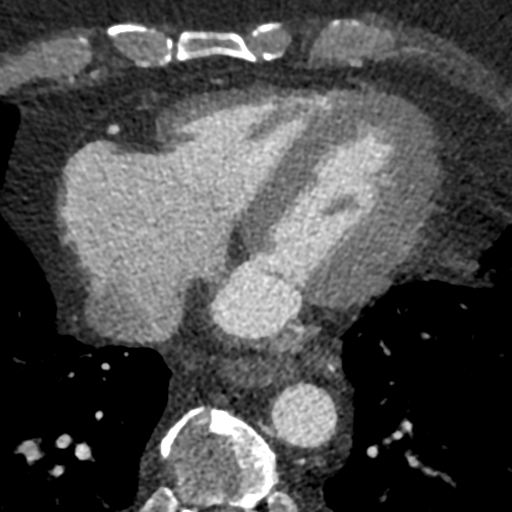
[im 222/333  vessel]
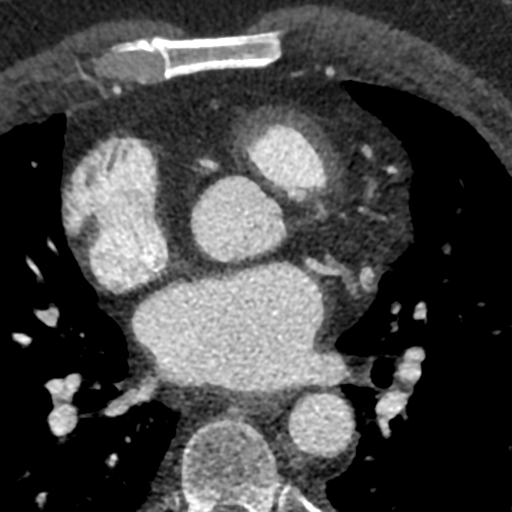

[Series 8: ts diast sharp 70 % · axial · 0.39mm/px · z∈[+1117,+1161]mm · 2 of 333 slices shown]
[im 111/333  lung]
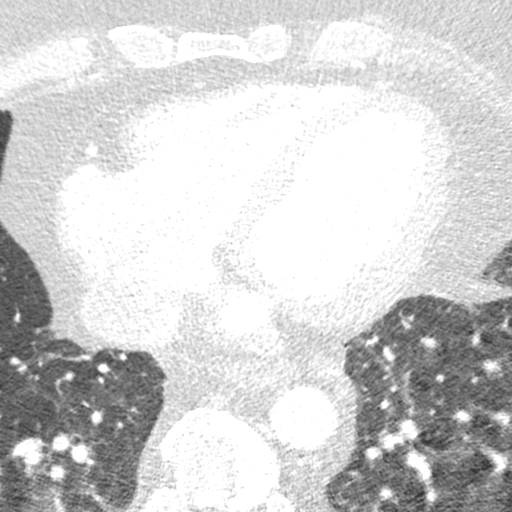
[im 222/333  lung]
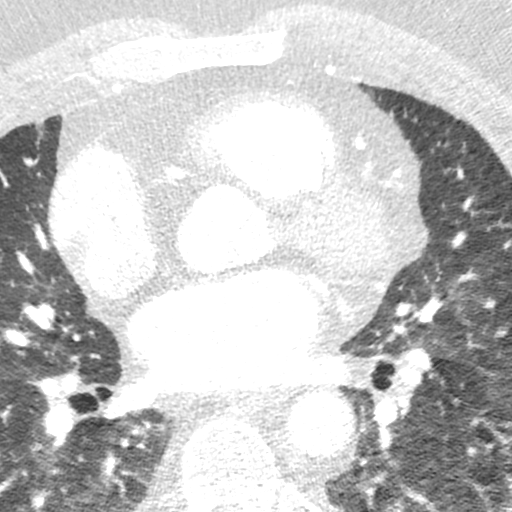

[Series 9: ts syst sharp 37 % · axial · 0.39mm/px · z∈[+1117,+1161]mm · 2 of 333 slices shown]
[im 111/333  lung]
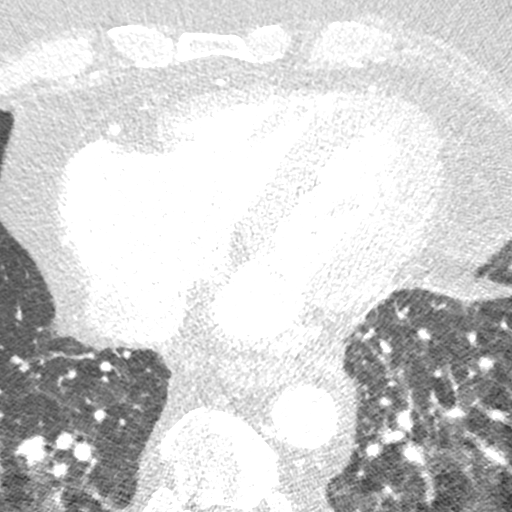
[im 222/333  lung]
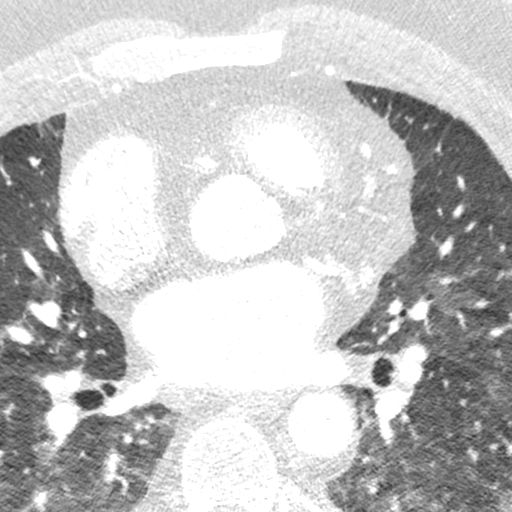

[8 of 20 positions shown; findings below may reference images not displayed]

FINDINGS: Calcified granuloma in the left lower lobe. Several calcified
mediastinal lymph nodes are incidentally noted. Within the
visualized portions of the thorax there are no suspicious appearing
pulmonary nodules or masses, there is no acute consolidative
airspace disease, no pleural effusions, no pneumothorax and no
lymphadenopathy. Visualized portions of the upper abdomen
demonstrates severe diffuse low attenuation throughout the
visualized hepatic parenchyma. There are no aggressive appearing
lytic or blastic lesions noted in the visualized portions of the
skeleton.
IMPRESSION: 1. Severe hepatic steatosis.
2. Old granulomatous disease, as above.
FINDINGS: Image quality: Average.

Noise artifact is: Moderate - motion artifact and reduced signal to
noise ratio.

Coronary calcium score is 0.

Coronary arteries: Normal coronary origins.  Right dominance.

Image quality reduces the sensitivity of this study to detect subtle
plaque.

Right Coronary Artery: No detectable plaque or stenosis.

Left Main Coronary Artery: No detectable plaque or stenosis.

Left Anterior Descending Coronary Artery: No detectable plaque or
stenosis.

Left Circumflex Artery: No detectable plaque or stenosis.

Aorta: Normal size, 32 mm at the mid ascending aorta (level of the
PA bifurcation) measured double oblique. No calcifications. No
dissection.

Aortic Valve: No calcifications.

Other findings:

Normal pulmonary vein drainage into the left atrium.

Normal left atrial appendage without a thrombus.

Mild dilation of main pulmonary artery, 31 mm.
IMPRESSION: 1. No evidence of CAD, CADRADS = 0. Image quality reduces the
sensitivity of this study to detect subtle plaque.

2. Coronary calcium score of 0.

3. Normal coronary origin with right dominance.

*** End of Addendum ***
EXAM:
OVER-READ INTERPRETATION  CT CHEST

The following report is an over-read performed by radiologist Dr.
MASLAKOV [REDACTED] on [DATE]. This
over-read does not include interpretation of cardiac or coronary
anatomy or pathology. The coronary calcium score/coronary CTA
interpretation by the cardiologist is attached.
FINDINGS: Calcified granuloma in the left lower lobe. Several calcified
mediastinal lymph nodes are incidentally noted. Within the
visualized portions of the thorax there are no suspicious appearing
pulmonary nodules or masses, there is no acute consolidative
airspace disease, no pleural effusions, no pneumothorax and no
lymphadenopathy. Visualized portions of the upper abdomen
demonstrates severe diffuse low attenuation throughout the
visualized hepatic parenchyma. There are no aggressive appearing
lytic or blastic lesions noted in the visualized portions of the
skeleton.
IMPRESSION: 1. Severe hepatic steatosis.
2. Old granulomatous disease, as above.

## 2020-03-26 MED ORDER — NITROGLYCERIN 0.4 MG SL SUBL
SUBLINGUAL_TABLET | SUBLINGUAL | Status: AC
  Filled 2020-03-26: qty 2

## 2020-03-26 MED ORDER — IOHEXOL 350 MG/ML SOLN
100.0000 mL | Freq: Once | INTRAVENOUS | Status: AC | PRN
  Administered 2020-03-26: 100 mL via INTRAVENOUS

## 2020-03-26 MED ORDER — METOPROLOL TARTRATE 5 MG/5ML IV SOLN
INTRAVENOUS | Status: AC
  Filled 2020-03-26: qty 5

## 2020-03-26 MED ORDER — NITROGLYCERIN 0.4 MG SL SUBL
0.8000 mg | SUBLINGUAL_TABLET | Freq: Once | SUBLINGUAL | Status: AC
  Administered 2020-03-26: 0.8 mg via SUBLINGUAL

## 2020-03-26 NOTE — Progress Notes (Signed)
CT scan completed. Tolerated well. D/C home ambulatory. Awake and alert. In no distress 

## 2020-03-29 ENCOUNTER — Encounter: Payer: Self-pay | Admitting: Family Medicine

## 2020-03-29 DIAGNOSIS — K760 Fatty (change of) liver, not elsewhere classified: Secondary | ICD-10-CM | POA: Insufficient documentation

## 2020-03-30 ENCOUNTER — Other Ambulatory Visit: Payer: Self-pay

## 2020-03-30 ENCOUNTER — Ambulatory Visit (INDEPENDENT_AMBULATORY_CARE_PROVIDER_SITE_OTHER): Payer: BC Managed Care – PPO | Admitting: Family Medicine

## 2020-03-30 ENCOUNTER — Encounter: Payer: Self-pay | Admitting: Family Medicine

## 2020-03-30 VITALS — BP 132/78 | HR 72 | Temp 97.6°F | Ht 73.0 in | Wt 297.4 lb

## 2020-03-30 DIAGNOSIS — Z7289 Other problems related to lifestyle: Secondary | ICD-10-CM | POA: Diagnosis not present

## 2020-03-30 DIAGNOSIS — K760 Fatty (change of) liver, not elsewhere classified: Secondary | ICD-10-CM | POA: Diagnosis not present

## 2020-03-30 DIAGNOSIS — Z789 Other specified health status: Secondary | ICD-10-CM

## 2020-03-30 NOTE — Progress Notes (Signed)
Chief Complaint  Patient presents with  . Results    Subjective: Patient is a 64 y.o. male here for follow-up CT scan.  Patient had a coronary CT that did not show any heart disease but did show severe hepatic steatosis. He has no personal or family history of liver disease. All of his lab work shows normal liver enzymes. He does consume around 16 oz of beer and a "large glass" of wine nightly. He will drink more on weekends, but does not know how much. He does not get belligerent. No famhx of autoimmune liver disease, Wilson's, and he denies hx of liver infection.   Past Medical History:  Diagnosis Date  . 10 year risk of MI or stroke 7.5% or greater 01/20/2016  . 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 Cancer ([place]) [id number]   skin cancer  . DDD (degenerative disc disease), lumbar   . Depression 01/14/2016  . Erectile dysfunction   . Essential hypertension 01/14/2016  . Hepatic steatosis   . History of TIA (transient ischemic attack)    per pt/ unsure if had TIA   . Hyperlipidemia   . Pericarditis 1986  . Tubular adenoma of colon    02/17/10    Objective: BP 132/78 (BP Location: Left Arm, Patient Position: Sitting, Cuff Size: Large)   Pulse 72   Temp 97.6 F (36.4 C) (Oral)   Ht 6\' 1"  (1.854 m)   Wt 297 lb 6 oz (134.9 kg)   SpO2 99%   BMI 39.23 kg/m  General: Awake, appears stated age HEENT: MMM, EOMi Heart: RRR Abd: Moderately distended, BS+, NT, ND Lungs: CTAB, no rales, wheezes or rhonchi. No accessory muscle use Psych: Age appropriate judgment and insight, normal affect and mood  Assessment and Plan: Hepatic steatosis - Plan: Hepatitis B Surface AntiGEN, Hepatitis B Core Antibody, total, Anti-smooth muscle antibody, IgG, Hepatic function panel, Ceruloplasmin, AntiMicrosomal Ab-Liver / Kidney, Hepatitis B surface antibody,quantitative, Gamma GT, Hepatitis A Ab, Total  Alcohol use  Orders as above. If neg, will consider elastography vs referral.  Counseled  on cutting down to 2 std drinks daily.  The patient voiced understanding and agreement to the plan.  [place], DO 03/30/20  4:41 PM

## 2020-03-30 NOTE — Patient Instructions (Addendum)
Based on your lab results, we will likely get an ultrasound of the liver.   Give [place] 4-5 business days to get the results of your labs back.   Try to keep it to keep it to 2 standard drinks daily (12 oz of beer, 6 oz wine, 1 oz of hard liquor; each would constitute as 1 standard drink).  Let us know if you need anything.

## 2020-03-31 LAB — HEPATIC FUNCTION PANEL
ALT: 55 U/L — ABNORMAL HIGH (ref 0–53)
AST: 35 U/L (ref 0–37)
Albumin: 4.4 g/dL (ref 3.5–5.2)
Alkaline Phosphatase: 49 U/L (ref 39–117)
Bilirubin, Direct: 0.2 mg/dL (ref 0.0–0.3)
Total Bilirubin: 1.2 mg/dL (ref 0.2–1.2)
Total Protein: 6.8 g/dL (ref 6.0–8.3)

## 2020-03-31 LAB — GAMMA GT: GGT: 85 U/L — ABNORMAL HIGH (ref 7–51)

## 2020-04-03 LAB — HEPATITIS B SURFACE ANTIBODY, QUANTITATIVE: Hep B S AB Quant (Post): <5 m[IU]/mL — ABNORMAL LOW (ref >=10)

## 2020-04-03 LAB — HEPATITIS A ANTIBODY, TOTAL: Hepatitis A AB,Total: NONREACTIVE

## 2020-04-03 LAB — ANTI-MICROSOMAL ANTIBODY LIVER / KIDNEY: LKM1 Ab: <=20 U (ref <=20.0)

## 2020-04-03 LAB — ANTI-SMOOTH MUSCLE ANTIBODY, IGG: Actin (Smooth Muscle) Antibody (IGG): <20 U (ref <20)

## 2020-04-03 LAB — CERULOPLASMIN: Ceruloplasmin: 25 mg/dL (ref 18–36)

## 2020-04-03 LAB — HEPATITIS B SURFACE ANTIGEN: Hepatitis B Surface Ag: NONREACTIVE

## 2020-04-03 LAB — HEPATITIS B CORE ANTIBODY, TOTAL: Hep B Core Total Ab: NONREACTIVE

## 2020-04-05 ENCOUNTER — Other Ambulatory Visit: Payer: Self-pay | Admitting: Family Medicine

## 2020-04-05 DIAGNOSIS — G4700 Insomnia, unspecified: Secondary | ICD-10-CM

## 2020-04-05 DIAGNOSIS — Z7289 Other problems related to lifestyle: Secondary | ICD-10-CM

## 2020-04-05 DIAGNOSIS — Z789 Other specified health status: Secondary | ICD-10-CM

## 2020-04-05 DIAGNOSIS — K760 Fatty (change of) liver, not elsewhere classified: Secondary | ICD-10-CM

## 2020-04-06 DIAGNOSIS — Z23 Encounter for immunization: Secondary | ICD-10-CM | POA: Diagnosis not present

## 2020-04-27 ENCOUNTER — Other Ambulatory Visit: Payer: Self-pay | Admitting: Family Medicine

## 2020-04-27 DIAGNOSIS — G4700 Insomnia, unspecified: Secondary | ICD-10-CM

## 2020-04-27 NOTE — Telephone Encounter (Signed)
Requesting: Ambien 5mg  Contract: None OTR:RNHA Last Visit: 03/30/2020 Next Visit: 08/31/2020 Last Refill: 12/10/2019 #90 and 0RF Pt sig: 1 tab qhs prn  Please Advise

## 2020-05-17 ENCOUNTER — Other Ambulatory Visit: Payer: Self-pay | Admitting: Family Medicine

## 2020-05-17 DIAGNOSIS — M545 Low back pain, unspecified: Secondary | ICD-10-CM

## 2020-06-21 ENCOUNTER — Other Ambulatory Visit: Payer: Self-pay | Admitting: Family Medicine

## 2020-06-21 DIAGNOSIS — I10 Essential (primary) hypertension: Secondary | ICD-10-CM

## 2020-07-07 ENCOUNTER — Other Ambulatory Visit: Payer: Self-pay

## 2020-07-07 DIAGNOSIS — G4700 Insomnia, unspecified: Secondary | ICD-10-CM

## 2020-07-07 MED ORDER — ZOLPIDEM TARTRATE 5 MG PO TABS: 5.0000 mg | ORAL_TABLET | Freq: Every evening | ORAL | 1 refills | Status: DC | PRN

## 2020-07-07 NOTE — Telephone Encounter (Signed)
Requesting:zolpidem(ambien) 5 mg tablet Contract:unknown TLX:[id number] Last Visit:03/30/20 Next Visit:08/31/20 Last Refill:04/27/20  Please Advise

## 2020-07-16 ENCOUNTER — Telehealth: Payer: Self-pay | Admitting: Family Medicine

## 2020-07-16 DIAGNOSIS — G4700 Insomnia, unspecified: Secondary | ICD-10-CM

## 2020-07-16 MED ORDER — ZOLPIDEM TARTRATE 5 MG PO TABS: 5.0000 mg | ORAL_TABLET | Freq: Every evening | ORAL | 1 refills | Status: DC | PRN

## 2020-07-16 NOTE — Telephone Encounter (Signed)
Zolpidem was refilled to [facility] on 07/07/2020. They did not refill because his company no longer uses them. He is requesting his zolpidem refill be sent to [place]

## 2020-07-16 NOTE — Telephone Encounter (Signed)
Medication: zolpidem (AMBIEN) 5 MG tablet [[id number]]     Has the patient contacted their pharmacy? no (If no, request that the patient contact the pharmacy for the refill.) (If yes, when and what did the pharmacy advise?)    Preferred Pharmacy (with phone number or street name):  [facility] [place], [place] to [address] [place] [address] Phone:  [phone]  Fax:  [phone]        Agent: Please be advised that RX refills may take up to 3 business days. We ask that you follow-up with your pharmacy.

## 2020-08-06 ENCOUNTER — Telehealth: Payer: Self-pay | Admitting: Family Medicine

## 2020-08-06 DIAGNOSIS — I10 Essential (primary) hypertension: Secondary | ICD-10-CM

## 2020-08-09 MED ORDER — LISINOPRIL 40 MG PO TABS: 40.0000 mg | ORAL_TABLET | Freq: Every day | ORAL | 1 refills | Status: DC

## 2020-08-09 NOTE — Telephone Encounter (Signed)
Prescription was sent to the wrong pharmacy.  lisinopril (ZESTRIL) 40 MG tablet [[id number]]   New correct pharmacy  [facility] [address] [place], [place] [address]  Phone:  [phone] Fax:  [phone]

## 2020-08-31 ENCOUNTER — Encounter: Payer: BC Managed Care – PPO | Admitting: Family Medicine

## 2020-09-06 ENCOUNTER — Telehealth: Payer: Self-pay | Admitting: Family Medicine

## 2020-09-06 MED ORDER — METHYLPREDNISOLONE 4 MG PO TBPK
ORAL_TABLET | ORAL | 0 refills | Status: DC
Start: 2020-09-06 — End: 2020-10-26

## 2020-09-06 MED ORDER — METHYLPREDNISOLONE 4 MG PO TBPK: ORAL_TABLET | ORAL | 0 refills | Status: DC

## 2020-09-06 NOTE — Telephone Encounter (Signed)
Medication:methylPREDNISolone (MEDROL DOSEPAK) 4 MG TBPK tablet       Has the patient contacted their pharmacy? no (If no, request that the patient contact the pharmacy for the refill.) (If yes, when and what did the pharmacy advise?)    Preferred Pharmacy (with phone number or street name):   [address] [place], [place] [address]    Agent: Please be advised that RX refills may take up to 3 business days. We ask that you follow-up with your pharmacy.

## 2020-10-05 ENCOUNTER — Other Ambulatory Visit: Payer: Self-pay

## 2020-10-05 ENCOUNTER — Encounter: Payer: Self-pay | Admitting: Family Medicine

## 2020-10-05 ENCOUNTER — Ambulatory Visit (INDEPENDENT_AMBULATORY_CARE_PROVIDER_SITE_OTHER): Payer: BC Managed Care – PPO | Admitting: Family Medicine

## 2020-10-05 VITALS — BP 126/78 | HR 76 | Temp 97.8°F | Resp 18 | Ht 73.0 in | Wt 305.0 lb

## 2020-10-05 DIAGNOSIS — Z125 Encounter for screening for malignant neoplasm of prostate: Secondary | ICD-10-CM

## 2020-10-05 DIAGNOSIS — L989 Disorder of the skin and subcutaneous tissue, unspecified: Secondary | ICD-10-CM | POA: Diagnosis not present

## 2020-10-05 DIAGNOSIS — Z0000 Encounter for general adult medical examination without abnormal findings: Secondary | ICD-10-CM

## 2020-10-05 DIAGNOSIS — E782 Mixed hyperlipidemia: Secondary | ICD-10-CM

## 2020-10-05 LAB — LIPID PANEL
Cholesterol: 161 mg/dL (ref 0–200)
HDL: 39.4 mg/dL (ref >39.00)
NonHDL: 121.24
Total CHOL/HDL Ratio: 4
Triglycerides: 304 mg/dL — ABNORMAL HIGH (ref 0.0–149.0)
VLDL: 60.8 mg/dL — ABNORMAL HIGH (ref 0.0–40.0)

## 2020-10-05 LAB — COMPREHENSIVE METABOLIC PANEL
ALT: 48 U/L (ref 0–53)
AST: 35 U/L (ref 0–37)
Albumin: 4.3 g/dL (ref 3.5–5.2)
Alkaline Phosphatase: 47 U/L (ref 39–117)
BUN: 16 mg/dL (ref 6–23)
CO2: 29 mEq/L (ref 19–32)
Calcium: 9.5 mg/dL (ref 8.4–10.5)
Chloride: 95 mEq/L — ABNORMAL LOW (ref 96–112)
Creatinine, Ser: 1.13 mg/dL (ref 0.40–1.50)
GFR: 69.59 mL/min (ref >60.00)
Glucose, Bld: 91 mg/dL (ref 70–99)
Potassium: 4.2 mEq/L (ref 3.5–5.1)
Sodium: 131 mEq/L — ABNORMAL LOW (ref 135–145)
Total Bilirubin: 0.7 mg/dL (ref 0.2–1.2)
Total Protein: 6.6 g/dL (ref 6.0–8.3)

## 2020-10-05 LAB — PSA: PSA: 1.79 ng/mL (ref 0.10–4.00)

## 2020-10-05 LAB — CBC
HCT: 39.5 % (ref 39.0–52.0)
Hemoglobin: 13.8 g/dL (ref 13.0–17.0)
MCHC: 35 g/dL (ref 30.0–36.0)
MCV: 90.9 fl (ref 78.0–100.0)
Platelets: 247 10*3/uL (ref 150.0–400.0)
RBC: 4.34 Mil/uL (ref 4.22–5.81)
RDW: 13.5 % (ref 11.5–15.5)
WBC: 4.8 10*3/uL (ref 4.0–10.5)

## 2020-10-05 LAB — LDL CHOLESTEROL, DIRECT: Direct LDL: 84 mg/dL

## 2020-10-05 NOTE — Progress Notes (Signed)
Chief Complaint  Patient presents with  . Annual Exam    Well Male Javier King is here for a complete physical.   His last physical was >1 year ago.  Current diet: in general, diet has been poor as of late.  Current exercise: none lately  Weight trend: gaining Fatigue out of ordinary? No. Seat belt? Yes.    Health maintenance Shingrix- Yes Colonoscopy- Due Tetanus- Yes HIV- Yes Hep C- Yes   Past Medical History:  Diagnosis Date  . 10 year risk of MI or stroke 7.5% or greater 01/20/2016  . 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 Cancer ([place]) [id number]   skin cancer  . DDD (degenerative disc disease), lumbar   . Depression 01/14/2016  . Erectile dysfunction   . Essential hypertension 01/14/2016  . Hepatic steatosis   . History of TIA (transient ischemic attack)    per pt/ unsure if had TIA   . Hyperlipidemia   . Pericarditis 1986  . Tubular adenoma of colon    02/17/10      Past Surgical History:  Procedure Laterality Date  . COLONOSCOPY    . OTHER SURGICAL HISTORY  2003,2004   Back surgery/ 2 laminectomies in lower back  . TONSILLECTOMY      Medications  Current Outpatient Medications on File Prior to Visit  Medication Sig Dispense Refill  . aspirin EC 81 MG tablet Take 81 mg by mouth daily.    . chlorthalidone (HYGROTON) 25 MG tablet Take 1 tablet (25 mg total) by mouth daily. 90 tablet 1  . cyclobenzaprine (FLEXERIL) 5 MG tablet Take 1 tablet (5 mg total) by mouth 2 (two) times daily as needed for muscle spasms. 180 tablet 0  . lisinopril (ZESTRIL) 40 MG tablet Take 1 tablet (40 mg total) by mouth daily. 90 tablet 1  . meloxicam (MOBIC) 15 MG tablet TAKE 1 TABLET DAILY 90 tablet 3  . metoprolol tartrate (LOPRESSOR) 25 MG tablet TAKE 1 TABLET TWICE A DAY 180 tablet 3  . pantoprazole (PROTONIX) 40 MG tablet TAKE 1 TABLET DAILY BEFORE BREAKFAST 90 tablet 3  . rosuvastatin (CRESTOR) 20 MG tablet Take 1 tablet (20 mg total) by mouth daily. 90 tablet 3   . sildenafil (REVATIO) 20 MG tablet Take by mouth.    . traZODone (DESYREL) 100 MG tablet TAKE 1 TABLET AT BEDTIME 90 tablet 3  . zolpidem (AMBIEN) 5 MG tablet Take 1 tablet (5 mg total) by mouth at bedtime as needed. for sleep 90 tablet 1    Allergies Allergies  Allergen Reactions  . Penicillins Itching    Family History Family History  Problem Relation Age of Onset  . Heart disease Mother   . Diabetes Sister   . Alzheimer's disease Father   . Parkinsonism Father   . Leukemia Sister     Review of Systems: Constitutional:  no fevers Eye:  no recent significant change in vision Ear/Nose/Mouth/Throat:  Ears:  no hearing loss Nose/Mouth/Throat:  no complaints of nasal congestion, no sore throat Cardiovascular:  no chest pain Respiratory:  no shortness of breath Gastrointestinal:  no change in bowel habits GU:  Male: negative for dysuria, frequency Musculoskeletal/Extremities:  no joint pain Integumentary (Skin/Breast):  No new abnormal skin lesions reported Neurologic:  no headaches Endocrine: No unexpected weight changes Hematologic/Lymphatic:  no abnormal bleeding  Exam BP 126/78 (BP Location: Left Arm, Patient Position: Sitting, Cuff Size: Normal)   Pulse 76   Temp 97.8 F (36.6 C) (Oral)  Resp 18   Ht 6\' 1"  (1.854 m)   Wt (!) 305 lb (138.3 kg)   SpO2 96%   BMI 40.24 kg/m  General:  well developed, well nourished, in no apparent distress Skin:  no significant moles, warts, or growths Head:  no masses, lesions, or tenderness Eyes:  pupils equal and round, sclera anicteric without injection Ears:  canals without lesions, TMs shiny without retraction, no obvious effusion, no erythema Nose:  nares patent, septum midline, mucosa normal Throat/Pharynx:  lips and gingiva without lesion; tongue and uvula midline; non-inflamed pharynx; no exudates or postnasal drainage Neck: neck supple without adenopathy, thyromegaly, or masses Cardiac: RRR, no bruits, no LE  edema Lungs:  clear to auscultation, breath sounds equal bilaterally, no respiratory distress Abdomen: BS+, soft, non-tender, non-distended, no masses or organomegaly noted Rectal: Deferred Musculoskeletal:  symmetrical muscle groups noted without atrophy or deformity Neuro:  gait normal; deep tendon reflexes normal and symmetric Psych: well oriented with normal range of affect and appropriate judgment/insight  Assessment and Plan  Well adult exam - Plan: CBC, Comprehensive metabolic panel, Lipid panel  Morbid obesity ([place]) - Plan: Amb Ref to Medical Weight Management  Skin lesion - Plan: Ambulatory referral to Dermatology  Screening for prostate cancer - Plan: PSA   Well 65 y.o. male. Counseled on diet and exercise. Counseled on risks and benefits of prostate cancer screening with PSA. The patient agrees to undergo testing. Immunizations, labs, and further orders as above. Needs to reach out to GI team for f/u ccs. He was sent a letter and just needs to call.  MWM team for increasing weight. Refer to derm for skin checks.  Follow up in 6 mo or prn. The patient voiced understanding and agreement to the plan.  [place], DO 10/05/20 8:36 AM

## 2020-10-05 NOTE — Patient Instructions (Addendum)
Give [place] 2-3 business days to get the results of your labs back.   Keep the diet clean and stay active.  If you do not hear anything about your referral in the next 1-2 weeks, call our office and ask for an update.  Set up your GI appointment for a repeat colonoscopy.   Let us know if you need anything.

## 2020-10-05 NOTE — Addendum Note (Signed)
Addended byDamita Dunnings D on: 10/05/2020 02:46 PM   Modules accepted: Orders

## 2020-10-26 ENCOUNTER — Other Ambulatory Visit: Payer: Self-pay

## 2020-10-26 ENCOUNTER — Ambulatory Visit (INDEPENDENT_AMBULATORY_CARE_PROVIDER_SITE_OTHER): Payer: BC Managed Care – PPO | Admitting: Internal Medicine

## 2020-10-26 ENCOUNTER — Encounter: Payer: Self-pay | Admitting: Internal Medicine

## 2020-10-26 VITALS — BP 138/80 | HR 78 | Ht 73.0 in | Wt 303.6 lb

## 2020-10-26 DIAGNOSIS — R072 Precordial pain: Secondary | ICD-10-CM | POA: Diagnosis not present

## 2020-10-26 DIAGNOSIS — R0600 Dyspnea, unspecified: Secondary | ICD-10-CM | POA: Diagnosis not present

## 2020-10-26 DIAGNOSIS — R0609 Other forms of dyspnea: Secondary | ICD-10-CM

## 2020-10-26 DIAGNOSIS — I480 Paroxysmal atrial fibrillation: Secondary | ICD-10-CM | POA: Diagnosis not present

## 2020-10-26 DIAGNOSIS — I10 Essential (primary) hypertension: Secondary | ICD-10-CM | POA: Diagnosis not present

## 2020-10-26 DIAGNOSIS — E782 Mixed hyperlipidemia: Secondary | ICD-10-CM

## 2020-10-26 NOTE — Progress Notes (Signed)
Cardiology Office Note:    Date:  10/26/2020   ID:  Javier King Javier King, DOB 29-Oct-1955, MRN [id number]  PCP:  Shelda Pal, DO  Cardiologist:  Elouise Munroe, MD  Electrophysiologist:  None   Referring MD: Shelda Pal*   Chief Complaint/Reason for Referral: Afib  History of Present Illness:    Javier King is a 65 y.o. male with a history of anxiety and depression, musculoskeletal pain, HTN, possible TIA, HLD, pericarditis who presents for follow up of incidentally noted atrial fibrillation. He continues on aspirin and metoprolol. We participated in shared decision making. He feels that his diagnosis of TIA was questionable, and therefore would not meet the threshold for anticoagulation for afib (would only score 1 point for hypertension). He would prefer not to take anticoagulation at this time, however we did discuss that at age 66 he would meet criteria for [facility] and we will discuss it over serial follow up.    Feeling well overall. His daughter is expecting a baby.  Triglycerides are elevated and he will make dietary modifications.  The patient denies chest pain, chest pressure, dyspnea at rest or with exertion, palpitations, PND, orthopnea, or leg swelling. Denies cough, fever, chills. Denies nausea, vomiting. Denies syncope or presyncope. Denies dizziness or lightheadedness.   Past Medical History:  Diagnosis Date  . 10 year risk of MI or stroke 7.5% or greater 01/20/2016  . 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 Cancer ([place]) [id number]   skin cancer  . DDD (degenerative disc disease), lumbar   . Depression 01/14/2016  . Erectile dysfunction   . Essential hypertension 01/14/2016  . Hepatic steatosis   . History of TIA (transient ischemic attack)    per pt/ unsure if had TIA   . Hyperlipidemia   . Pericarditis 1986  . Tubular adenoma of colon    02/17/10    Past Surgical History:  Procedure Laterality Date  . COLONOSCOPY    . OTHER  SURGICAL HISTORY  2003,2004   Back surgery/ 2 laminectomies in lower back  . TONSILLECTOMY      Current Medications: Current Meds  Medication Sig  . aspirin EC 81 MG tablet Take 81 mg by mouth daily.  . chlorthalidone (HYGROTON) 25 MG tablet Take 1 tablet (25 mg total) by mouth daily.  . cyclobenzaprine (FLEXERIL) 5 MG tablet Take 1 tablet (5 mg total) by mouth 2 (two) times daily as needed for muscle spasms.  Marland Kitchen lisinopril (ZESTRIL) 40 MG tablet Take 1 tablet (40 mg total) by mouth daily.  . meloxicam (MOBIC) 15 MG tablet TAKE 1 TABLET DAILY  . metoprolol tartrate (LOPRESSOR) 25 MG tablet TAKE 1 TABLET TWICE A DAY  . pantoprazole (PROTONIX) 40 MG tablet TAKE 1 TABLET DAILY BEFORE BREAKFAST  . rosuvastatin (CRESTOR) 20 MG tablet Take 1 tablet (20 mg total) by mouth daily.  . sildenafil (REVATIO) 20 MG tablet Take by mouth.  . traZODone (DESYREL) 100 MG tablet TAKE 1 TABLET AT BEDTIME  . zolpidem (AMBIEN) 5 MG tablet Take 1 tablet (5 mg total) by mouth at bedtime as needed. for sleep     Allergies:   Penicillins   Social History   Tobacco Use  . Smoking status: Never Smoker  . Smokeless tobacco: Never Used  Substance Use Topics  . Alcohol use: Yes    Alcohol/week: 3.0 standard drinks    Types: 1 Glasses of wine, 1 Cans of beer, 1 Shots of liquor per week    Comment:  occassionall  . Drug use: No     Family History: The patient's family history includes Alzheimer's disease in his father; Diabetes in his sister; Heart disease in his mother; Leukemia in his sister; Parkinsonism in his father.  ROS:   Please see the history of present illness.    All other systems reviewed and are negative.  EKGs/Labs/Other Studies Reviewed:    The following studies were reviewed today:  EKG:  NSR  Recent Labs: 10/05/2020: ALT 48; BUN 16; Creatinine, Ser 1.13; Hemoglobin 13.8; Platelets 247.0; Potassium 4.2; Sodium 131  Recent Lipid Panel    Component Value Date/Time   CHOL 161  10/05/2020 0827   TRIG 304.0 (H) 10/05/2020 0827   HDL 39.40 10/05/2020 0827   CHOLHDL 4 10/05/2020 0827   VLDL 60.8 (H) 10/05/2020 0827   LDLCALC 134 (H) 03/04/2020 0759   LDLDIRECT 84.0 10/05/2020 0827    Physical Exam:    VS:  BP 138/80 (BP Location: Left Arm, Patient Position: Sitting, Cuff Size: Large)   Pulse 78   Ht 6\' 1"  (1.854 m)   Wt (!) 303 lb 9.6 oz (137.7 kg)   BMI 40.06 kg/m     Wt Readings from Last 5 Encounters:  10/26/20 (!) 303 lb 9.6 oz (137.7 kg)  10/05/20 (!) 305 lb (138.3 kg)  03/30/20 297 lb 6 oz (134.9 kg)  02/27/20 295 lb 4 oz (133.9 kg)  02/09/20 279 lb (126.6 kg)    Constitutional: No acute distress Eyes: sclera non-icteric, normal conjunctiva and lids ENMT: normal dentition, moist mucous membranes Cardiovascular: regular rhythm, normal rate, no murmurs. S1 and S2 normal. Radial pulses normal bilaterally. No jugular venous distention.  Respiratory: clear to auscultation bilaterally GI : normal bowel sounds, soft and nontender. No distention.   MSK: extremities warm, well perfused. No edema.  NEURO: grossly nonfocal exam, moves all extremities. PSYCH: alert and oriented x 3, normal mood and affect.   ASSESSMENT:    1. Essential hypertension   2. Precordial pain   3. Dyspnea on exertion   4. Paroxysmal atrial fibrillation (HCC)   5. Mixed hyperlipidemia    PLAN:    Essential hypertension - Plan: EKG 12-Lead - overall well controlled chlorthalidone, lisinopril, metoprolol  Precordial pain Dyspnea on exertion -No recurrence recently, continue to observe.  Normal coronary CTA.  Paroxysmal atrial fibrillation (HCC) -No recurrence, low CHA2DS2-VASc score and not on anticoagulation after shared decision making.  Mixed hyperlipidemia -Discussed hypertriglyceridemia today.  Of note patient also has hepatic steatosis on CT imaging, this is being followed by primary care.  Total time of encounter: 30 minutes total time of encounter, including  20 minutes spent in face-to-face patient care on the date of this encounter. This time includes coordination of care and counseling regarding above mentioned problem list. Remainder of non-face-to-face time involved reviewing chart documents/testing relevant to the patient encounter and documentation in the medical record. I have independently reviewed documentation from referring provider.   Cherlynn Kaiser, MD, [facility]   Medication Adjustments/Labs and Tests Ordered: Current medicines are reviewed at length with the patient today.  Concerns regarding medicines are outlined above.   Orders Placed This Encounter  Procedures  . EKG 12-Lead    No orders of the defined types were placed in this encounter.   Patient Instructions  Medication Instructions:  No Changes In Medications at this time.  *If you need a refill on your cardiac medications before your next appointment, please call your pharmacy*  Follow-Up: At [facility], you and your health needs are our priority.  As part of our continuing mission to provide you with exceptional heart care, we have created designated Provider Care Teams.  These Care Teams include your primary Cardiologist (physician) and Advanced Practice Providers (APPs -  Physician Assistants and Nurse Practitioners) who all work together to provide you with the care you need, when you need it.  Your next appointment:   1 year(s)  The format for your next appointment:   In Person  Provider:   Cherlynn Kaiser, MD

## 2020-10-26 NOTE — Patient Instructions (Signed)

## 2020-10-28 ENCOUNTER — Other Ambulatory Visit (HOSPITAL_BASED_OUTPATIENT_CLINIC_OR_DEPARTMENT_OTHER): Payer: Self-pay

## 2020-10-28 ENCOUNTER — Ambulatory Visit: Payer: BC Managed Care – PPO | Attending: Internal Medicine

## 2020-10-28 DIAGNOSIS — Z23 Encounter for immunization: Secondary | ICD-10-CM

## 2020-10-28 MED ORDER — COVID-19 MRNA VAC-TRIS(PFIZER) 30 MCG/0.3ML IM SUSP
INTRAMUSCULAR | 0 refills | Status: DC
Start: 2020-10-28 — End: 2020-12-16
  Filled 2020-10-28: qty 0.25, 1d supply, fill #0

## 2020-10-28 NOTE — Progress Notes (Signed)
   Covid-19 Vaccination Clinic  Name:  GIVANNI STARON    MRN: [id number] DOB: 01-07-1956  10/28/2020  Mr. Perz was observed post Covid-19 immunization for 15 minutes without incident. He was provided with Vaccine Information Sheet and instruction to access the V-Safe system.   Mr. Garrelts was instructed to call 911 with any severe reactions post vaccine: Difficulty breathing  Swelling of face and throat  A fast heartbeat  A bad rash all over body  Dizziness and weakness   Immunizations Administered     Name Date Dose VIS Date Route   Moderna Covid-19 Booster Vaccine 10/28/2020  1:49 PM 0.25 mL 03/10/2020 Intramuscular   Manufacturer: Moderna   Lot: [id number]   [place]: [id number]

## 2020-11-06 ENCOUNTER — Other Ambulatory Visit: Payer: Self-pay | Admitting: Family Medicine

## 2020-11-06 DIAGNOSIS — I10 Essential (primary) hypertension: Secondary | ICD-10-CM

## 2020-11-08 ENCOUNTER — Other Ambulatory Visit: Payer: Self-pay | Admitting: Family Medicine

## 2020-11-08 DIAGNOSIS — I10 Essential (primary) hypertension: Secondary | ICD-10-CM

## 2020-11-08 MED ORDER — CHLORTHALIDONE 25 MG PO TABS: 25.0000 mg | ORAL_TABLET | Freq: Every day | ORAL | 1 refills | Status: DC

## 2020-11-17 ENCOUNTER — Other Ambulatory Visit: Payer: Self-pay

## 2020-11-17 ENCOUNTER — Other Ambulatory Visit (INDEPENDENT_AMBULATORY_CARE_PROVIDER_SITE_OTHER): Payer: BC Managed Care – PPO

## 2020-11-17 DIAGNOSIS — E782 Mixed hyperlipidemia: Secondary | ICD-10-CM

## 2020-11-17 LAB — BASIC METABOLIC PANEL
BUN: 10 mg/dL (ref 6–23)
CO2: 30 mEq/L (ref 19–32)
Calcium: 9.9 mg/dL (ref 8.4–10.5)
Chloride: 94 mEq/L — ABNORMAL LOW (ref 96–112)
Creatinine, Ser: 1.15 mg/dL (ref 0.40–1.50)
GFR: 68.08 mL/min (ref >60.00)
Glucose, Bld: 97 mg/dL (ref 70–99)
Potassium: 4.3 mEq/L (ref 3.5–5.1)
Sodium: 131 mEq/L — ABNORMAL LOW (ref 135–145)

## 2020-11-17 LAB — LIPID PANEL
Cholesterol: 134 mg/dL (ref 0–200)
HDL: 45.7 mg/dL (ref >39.00)
LDL Cholesterol: 65 mg/dL (ref 0–99)
NonHDL: 88.68
Total CHOL/HDL Ratio: 3
Triglycerides: 119 mg/dL (ref 0.0–149.0)
VLDL: 23.8 mg/dL (ref 0.0–40.0)

## 2020-11-28 ENCOUNTER — Other Ambulatory Visit: Payer: Self-pay | Admitting: Family Medicine

## 2020-11-29 ENCOUNTER — Other Ambulatory Visit: Payer: Self-pay | Admitting: Family Medicine

## 2020-11-29 MED ORDER — ROSUVASTATIN CALCIUM 20 MG PO TABS
20.0000 mg | ORAL_TABLET | Freq: Every day | ORAL | 3 refills | Status: DC
Start: 2020-11-29 — End: 2020-12-08

## 2020-11-29 MED ORDER — MELOXICAM 15 MG PO TABS
15.0000 mg | ORAL_TABLET | Freq: Every day | ORAL | 3 refills | Status: DC
Start: 2020-11-29 — End: 2020-12-08

## 2020-12-08 ENCOUNTER — Encounter (INDEPENDENT_AMBULATORY_CARE_PROVIDER_SITE_OTHER): Payer: Self-pay | Admitting: Bariatrics

## 2020-12-08 ENCOUNTER — Other Ambulatory Visit: Payer: Self-pay

## 2020-12-08 ENCOUNTER — Ambulatory Visit (INDEPENDENT_AMBULATORY_CARE_PROVIDER_SITE_OTHER): Payer: BC Managed Care – PPO | Admitting: Bariatrics

## 2020-12-08 VITALS — BP 149/85 | HR 65 | Temp 97.8°F | Ht 72.0 in | Wt 300.0 lb

## 2020-12-08 DIAGNOSIS — Z6841 Body Mass Index (BMI) 40.0 and over, adult: Secondary | ICD-10-CM

## 2020-12-08 DIAGNOSIS — R0602 Shortness of breath: Secondary | ICD-10-CM

## 2020-12-08 DIAGNOSIS — R7309 Other abnormal glucose: Secondary | ICD-10-CM | POA: Diagnosis not present

## 2020-12-08 DIAGNOSIS — R5383 Other fatigue: Secondary | ICD-10-CM

## 2020-12-08 DIAGNOSIS — Z0289 Encounter for other administrative examinations: Secondary | ICD-10-CM

## 2020-12-08 DIAGNOSIS — Z9189 Other specified personal risk factors, not elsewhere classified: Secondary | ICD-10-CM

## 2020-12-08 DIAGNOSIS — I10 Essential (primary) hypertension: Secondary | ICD-10-CM | POA: Diagnosis not present

## 2020-12-08 DIAGNOSIS — E559 Vitamin D deficiency, unspecified: Secondary | ICD-10-CM | POA: Diagnosis not present

## 2020-12-08 DIAGNOSIS — K760 Fatty (change of) liver, not elsewhere classified: Secondary | ICD-10-CM | POA: Diagnosis not present

## 2020-12-08 DIAGNOSIS — Z1331 Encounter for screening for depression: Secondary | ICD-10-CM | POA: Diagnosis not present

## 2020-12-08 DIAGNOSIS — E7849 Other hyperlipidemia: Secondary | ICD-10-CM

## 2020-12-09 ENCOUNTER — Encounter (INDEPENDENT_AMBULATORY_CARE_PROVIDER_SITE_OTHER): Payer: Self-pay | Admitting: Bariatrics

## 2020-12-09 DIAGNOSIS — R748 Abnormal levels of other serum enzymes: Secondary | ICD-10-CM | POA: Insufficient documentation

## 2020-12-09 DIAGNOSIS — E559 Vitamin D deficiency, unspecified: Secondary | ICD-10-CM | POA: Insufficient documentation

## 2020-12-09 LAB — COMPREHENSIVE METABOLIC PANEL
ALT: 52 IU/L — ABNORMAL HIGH (ref 0–44)
AST: 44 IU/L — ABNORMAL HIGH (ref 0–40)
Albumin/Globulin Ratio: 2 (ref 1.2–2.2)
Albumin: 4.5 g/dL (ref 3.8–4.8)
Alkaline Phosphatase: 56 IU/L (ref 44–121)
BUN/Creatinine Ratio: 6 — ABNORMAL LOW (ref 10–24)
BUN: 6 mg/dL — ABNORMAL LOW (ref 8–27)
Bilirubin Total: 0.7 mg/dL (ref 0.0–1.2)
CO2: 23 mmol/L (ref 20–29)
Calcium: 10 mg/dL (ref 8.6–10.2)
Chloride: 100 mmol/L (ref 96–106)
Creatinine, Ser: 1.01 mg/dL (ref 0.76–1.27)
Globulin, Total: 2.3 g/dL (ref 1.5–4.5)
Glucose: 84 mg/dL (ref 65–99)
Potassium: 5 mmol/L (ref 3.5–5.2)
Sodium: 137 mmol/L (ref 134–144)
Total Protein: 6.8 g/dL (ref 6.0–8.5)
eGFR: 84 mL/min/{1.73_m2} (ref >59)

## 2020-12-09 LAB — INSULIN, RANDOM: INSULIN: 6.1 u[IU]/mL (ref 2.6–24.9)

## 2020-12-09 LAB — HEMOGLOBIN A1C
Est. average glucose Bld gHb Est-mCnc: 111 mg/dL
Hgb A1c MFr Bld: 5.5 % (ref 4.8–5.6)

## 2020-12-09 LAB — T4, FREE: Free T4: 1.34 ng/dL (ref 0.82–1.77)

## 2020-12-09 LAB — T3: T3, Total: 121 ng/dL (ref 71–180)

## 2020-12-09 LAB — TSH: TSH: 2.37 u[IU]/mL (ref 0.450–4.500)

## 2020-12-09 LAB — VITAMIN D 25 HYDROXY (VIT D DEFICIENCY, FRACTURES): Vit D, 25-Hydroxy: 27.9 ng/mL — ABNORMAL LOW (ref 30.0–100.0)

## 2020-12-16 NOTE — Progress Notes (Signed)
Dear Dr. Riki Sheer,   Thank you for referring Javier King to our clinic. The following note includes my evaluation and treatment recommendations.  Chief Complaint:   OBESITY Javier King (MR# [id number]) is a 65 y.o. male who presents for evaluation and treatment of obesity and related comorbidities. Current BMI is Body mass index is 40.69 kg/m. Javier King has been struggling with his weight for many years and has been unsuccessful in either losing weight, maintaining weight loss, or reaching his healthy weight goal.  Javier King is currently in the action stage of change and ready to dedicate time achieving and maintaining a healthier weight. Javier King is interested in becoming our patient and working on intensive lifestyle modifications including (but not limited to) diet and exercise for weight loss.  Javier King does like to cook. He craves carbohydrates and sweets. He skips breakfast often.  Javier King's habits were reviewed today and are as follows: his desired weight loss is 75 lbs, he started gaining weight in his mid 30's, his heaviest weight ever was 300 pounds, he is a picky eater and doesn't like to eat healthier foods, he has significant food cravings issues, he snacks frequently in the evenings, he skips meals frequently, he is frequently drinking liquids with calories, he frequently eats larger portions than normal, and he struggles with emotional eating.  Depression Screen Javier King's Food and Mood (modified PHQ-9) score was 12.  Depression screen [facility] 2/9 12/08/2020  Decreased Interest 3  Down, Depressed, Hopeless 1  PHQ - 2 Score 4  Altered sleeping 1  Tired, decreased energy 3  Change in appetite 1  Feeling bad or failure about yourself  1  Trouble concentrating 1  Moving slowly or fidgety/restless 1  Suicidal thoughts 0  PHQ-9 Score 12  Difficult doing work/chores Somewhat difficult   Subjective:   1. Other fatigue Javier King admits to daytime somnolence and  admits to waking up still tired. Patent has a history of symptoms of daytime fatigue and morning fatigue. Javier King generally gets 10 hours of sleep per night, and states that he has generally restful sleep. Snoring is present. Apneic episodes are not present. [name] is 4.  2. SOB (shortness of breath) on exertion [name] notes increasing shortness of breath with exercising and seems to be worsening over time with weight gain. He notes getting out of breath sooner with activity than he used to. This has gotten worse recently. Javier King denies shortness of breath at rest or orthopnea.  3. Essential hypertension Javier King is taking lisinopril, metoprolol, and chlorthalidone.   4. Hepatic steatosis Javier King denies abdominal pain.  5. Other hyperlipidemia Pt is not on statin therapy.  6. Vitamin D deficiency He is not taking a Vit D supplement.  7. Elevated glucose Javier King does not have a diagnosis of diabetes mellitus.  8. At risk for activity intolerance Javier King is at risk for exercise intolerance due obesity and back pain.  Assessment/Plan:   1. Other fatigue Javier King does feel that his weight is causing his energy to be lower than it should be. Fatigue may be related to obesity, depression or many other causes. Labs will be ordered, and in the meanwhile, Javier King will focus on self care including making healthy food choices, increasing physical activity and focusing on stress reduction. Check labs today.  - Comprehensive metabolic panel - T4, free - TSH - T3  2. SOB (shortness of breath) on exertion Javier King does feel that he gets out of breath more easily that he  used to when he exercises. Javier King's shortness of breath appears to be obesity related and exercise induced. He has agreed to work on weight loss and gradually increase exercise to treat his exercise induced shortness of breath. Will continue to monitor closely.  3. Essential hypertension Javier King is working on  healthy weight loss and exercise to improve blood pressure control. We will watch for signs of hypotension as he continues his lifestyle modifications.  4. Hepatic steatosis We will follow over time.  5. Other hyperlipidemia Javier King will decreased saturated fats and no trans fats. Cardiovascular risk and specific lipid/LDL goals reviewed.  We discussed several lifestyle modifications today and Javier King will continue to work on diet, exercise and weight loss efforts. Orders and follow up as documented in patient record.   Counseling Intensive lifestyle modifications are the first line treatment for this issue. Dietary changes: Increase soluble fiber. Decrease simple carbohydrates. Exercise changes: Moderate to vigorous-intensity aerobic activity 150 minutes per week if tolerated. Lipid-lowering medications: see documented in medical record.  6. Vitamin D deficiency Low Vitamin D level contributes to fatigue and are associated with obesity, breast, and colon cancer. He agrees to follow-up for routine testing of Vitamin D, at least 2-3 times per year to avoid over-replacement. Check labs today.  - VITAMIN D 25 Hydroxy (Vit-D Deficiency, Fractures)  7. Elevated glucose Fasting labs will be obtained and results with be discussed with Javier King in 2 weeks at his follow up visit. In the meanwhile Javier King was started on a lower simple carbohydrate diet and will work on weight loss efforts.  - Hemoglobin A1c - Insulin, random  8. Depression screen Javier King had a positive depression screening. Depression is commonly associated with obesity and often results in emotional eating behaviors. We will monitor this closely and work on CBT to help improve the non-hunger eating patterns. Referral to Psychology may be required if no improvement is seen as he continues in our clinic.  9. At risk for activity intolerance Javier King was given approximately 15 minutes of exercise intolerance counseling today. He is  65 y.o. male and has risk factors exercise intolerance including obesity. We discussed intensive lifestyle modifications today with an emphasis on specific weight loss instructions and strategies. Cheryl will slowly increase activity as tolerated.  Repetitive spaced learning was employed today to elicit superior memory formation and behavioral change.   10. Class 3 severe obesity with serious comorbidity and body mass index (BMI) of 40.0 to 44.9 in adult, unspecified obesity type [facility])  Xzander is currently in the action stage of change and his goal is to continue with weight loss efforts. I recommend Trigg begin the structured treatment plan as follows:  He has agreed to the Category 1 Plan.  Meal planning 11/17/2020 labs reviewed with pt. Decrease any alcohol.  Exercise goals: No exercise has been prescribed at this time.   Behavioral modification strategies: increasing lean protein intake, decreasing simple carbohydrates, increasing vegetables, increasing water intake, decreasing eating out, no skipping meals, meal planning and cooking strategies, keeping healthy foods in the home, and planning for success.  He was informed of the importance of frequent follow-up visits to maximize his success with intensive lifestyle modifications for his multiple health conditions. He was informed we would discuss his lab results at his next visit unless there is a critical issue that needs to be addressed sooner. Harman agreed to keep his next visit at the agreed upon time to discuss these results.  Objective:   Blood pressure (!) 149/85, pulse  65, temperature 97.8 F (36.6 C), height 6' (1.829 m), weight 300 lb (136.1 kg), SpO2 99 %. Body mass index is 40.69 kg/m.  EKG: Normal sinus rhythm, rate 78.  Indirect Calorimeter completed today shows a VO2 of 166 and a REE of 1153.  His calculated basal metabolic rate is 123456 thus his basal metabolic rate is worse than expected.  General: Cooperative,  alert, well developed, in no acute distress. HEENT: Conjunctivae and lids unremarkable. Cardiovascular: Regular rhythm.  Lungs: Normal work of breathing. Neurologic: No focal deficits.   Lab Results  Component Value Date   CREATININE 1.01 12/08/2020   BUN 6 (L) 12/08/2020   NA 137 12/08/2020   K 5.0 12/08/2020   CL 100 12/08/2020   CO2 23 12/08/2020   Lab Results  Component Value Date   ALT 52 (H) 12/08/2020   AST 44 (H) 12/08/2020   ALKPHOS 56 12/08/2020   BILITOT 0.7 12/08/2020   Lab Results  Component Value Date   HGBA1C 5.5 12/08/2020   Lab Results  Component Value Date   INSULIN 6.1 12/08/2020   Lab Results  Component Value Date   TSH 2.370 12/08/2020   Lab Results  Component Value Date   CHOL 134 11/17/2020   HDL 45.70 11/17/2020   LDLCALC 65 11/17/2020   LDLDIRECT 84.0 10/05/2020   TRIG 119.0 11/17/2020   CHOLHDL 3 11/17/2020   Lab Results  Component Value Date   WBC 4.8 10/05/2020   HGB 13.8 10/05/2020   HCT 39.5 10/05/2020   MCV 90.9 10/05/2020   PLT 247.0 10/05/2020    Attestation Statements:   Reviewed by clinician on day of visit: allergies, medications, problem list, medical history, surgical history, family history, social history, and previous encounter notes.  Coral Ceo, CMA, am acting as Location manager for [employer], DO.  I have reviewed the above documentation for accuracy and completeness, and I agree with the above. Jearld Lesch, DO

## 2020-12-22 ENCOUNTER — Encounter (INDEPENDENT_AMBULATORY_CARE_PROVIDER_SITE_OTHER): Payer: Self-pay | Admitting: Bariatrics

## 2020-12-22 ENCOUNTER — Other Ambulatory Visit: Payer: Self-pay

## 2020-12-22 ENCOUNTER — Ambulatory Visit (INDEPENDENT_AMBULATORY_CARE_PROVIDER_SITE_OTHER): Payer: BC Managed Care – PPO | Admitting: Bariatrics

## 2020-12-22 VITALS — BP 133/78 | HR 68 | Temp 97.8°F | Ht 72.0 in | Wt 292.0 lb

## 2020-12-22 DIAGNOSIS — I10 Essential (primary) hypertension: Secondary | ICD-10-CM

## 2020-12-22 DIAGNOSIS — K760 Fatty (change of) liver, not elsewhere classified: Secondary | ICD-10-CM

## 2020-12-22 DIAGNOSIS — E559 Vitamin D deficiency, unspecified: Secondary | ICD-10-CM

## 2020-12-22 DIAGNOSIS — Z9189 Other specified personal risk factors, not elsewhere classified: Secondary | ICD-10-CM

## 2020-12-22 DIAGNOSIS — Z6841 Body Mass Index (BMI) 40.0 and over, adult: Secondary | ICD-10-CM

## 2020-12-22 MED ORDER — VITAMIN D (ERGOCALCIFEROL) 1.25 MG (50000 UNIT) PO CAPS: 50000.0000 [IU] | ORAL_CAPSULE | ORAL | 0 refills | Status: DC

## 2020-12-23 ENCOUNTER — Encounter (INDEPENDENT_AMBULATORY_CARE_PROVIDER_SITE_OTHER): Payer: Self-pay | Admitting: Bariatrics

## 2020-12-23 NOTE — Progress Notes (Signed)
Chief Complaint:   OBESITY Javier King is here to discuss his progress with his obesity treatment plan along with follow-up of his obesity related diagnoses. Javier King is on the Category 1 Plan and states he is following his eating plan approximately 95% of the time. Javier King states he is doing 2,000-3,000 steps 7 times per week.  Today's visit was #: 3 Starting weight: 300 lbs Starting date: 12/08/2020 Today's weight: 292 lbs Today's date: 12/22/2020 Total lbs lost to date: 8 lbs Total lbs lost since last in-office visit: 8 lbs  Interim History: Javier King is down 8 lbs since his initial visit. He states that the plan was hard. He realized how much he was snacking between meals.  Subjective:   1. Vitamin D insufficiency Javier King is not on Vitamin D.  2. Essential hypertension Javier King's hypertension is controlled  3. Hepatic steatosis Javier King denies abdominal pain. His last AST/ALT was slightly elevated.  4. At risk for impaired function of liver Javier King is at risk for impaired function of liver due to elevated liver enzymes. He will follow up with PCP.  Assessment/Plan:   1. Vitamin D insufficiency Low Vitamin D level contributes to fatigue and are associated with obesity, breast, and colon cancer. We will refill prescription Vitamin D 50,000 IU every week for 1 month with no refills and Javier King will follow-up for routine testing of Vitamin D, at least 2-3 times per year to avoid over-replacement.  - Vitamin D, Ergocalciferol, (DRISDOL) 1.25 MG (50000 UNIT) CAPS capsule; Take 1 capsule (50,000 Units total) by mouth every 7 (seven) days.  Dispense: 4 capsule; Refill: 0  2. Essential hypertension Javier King will continue medications. He is working on healthy weight loss and exercise to improve blood pressure control. We will watch for signs of hypotension as he continues his lifestyle modifications.   3. Hepatic steatosis Javier King will follow liver enzymes level or with PCP. Javier King  will have no alcohol and Tylenol.  4. At risk for impaired function of liver Javier King was given approximately 15 minutes of counseling today regarding prevention of impaired liver function. Javier King was educated about his risk of developing NASH or even liver failure and advised that the only proven treatment for NAFLD was weight loss of at least 5-10% of body weight.    5. Obesity, current BMI 39.7 Javier King is currently in the action stage of change. As such, his goal is to continue with weight loss efforts. He has agreed to the Category 1 Plan and keeping a food journal and adhering to recommended goals of 300-400 calories and 35 + grams of  protein.   Exercise goals:  Javier King will increase walking "Protein Equivalent".  Behavioral modification strategies: increasing lean protein intake, decreasing simple carbohydrates, increasing vegetables, increasing water intake, decreasing eating out, no skipping meals, meal planning and cooking strategies, keeping healthy foods in the home, and planning for success.  Javier King has agreed to follow-up with our clinic in 2 weeks. He was informed of the importance of frequent follow-up visits to maximize his success with intensive lifestyle modifications for his multiple health conditions.   Objective:   Blood pressure 133/78, pulse 68, temperature 97.8 F (36.6 C), height 6' (1.829 m), SpO2 98 %. Body mass index is 40.69 kg/m.  General: Cooperative, alert, well developed, in no acute distress. HEENT: Conjunctivae and lids unremarkable. Cardiovascular: Regular rhythm.  Lungs: Normal work of breathing. Neurologic: No focal deficits.   Lab Results  Component Value Date   CREATININE 1.01 12/08/2020  BUN 6 (L) 12/08/2020   NA 137 12/08/2020   K 5.0 12/08/2020   CL 100 12/08/2020   CO2 23 12/08/2020   Lab Results  Component Value Date   ALT 52 (H) 12/08/2020   AST 44 (H) 12/08/2020   ALKPHOS 56 12/08/2020   BILITOT 0.7 12/08/2020   Lab Results   Component Value Date   HGBA1C 5.5 12/08/2020   Lab Results  Component Value Date   INSULIN 6.1 12/08/2020   Lab Results  Component Value Date   TSH 2.370 12/08/2020   Lab Results  Component Value Date   CHOL 134 11/17/2020   HDL 45.70 11/17/2020   LDLCALC 65 11/17/2020   LDLDIRECT 84.0 10/05/2020   TRIG 119.0 11/17/2020   CHOLHDL 3 11/17/2020   Lab Results  Component Value Date   VD25OH 27.9 (L) 12/08/2020   VD25OH 24.36 (L) 02/11/2019   Lab Results  Component Value Date   WBC 4.8 10/05/2020   HGB 13.8 10/05/2020   HCT 39.5 10/05/2020   MCV 90.9 10/05/2020   PLT 247.0 10/05/2020   No results found for: IRON, TIBC, FERRITIN  Attestation Statements:   Reviewed by clinician on day of visit: allergies, medications, problem list, medical history, surgical history, family history, social history, and previous encounter notes.  I, Lizbeth Bark, RMA, am acting as Location manager for [employer], DO.   I have reviewed the above documentation for accuracy and completeness, and I agree with the above. Jearld Lesch, DO

## 2021-01-04 DIAGNOSIS — D485 Neoplasm of uncertain behavior of skin: Secondary | ICD-10-CM | POA: Diagnosis not present

## 2021-01-04 DIAGNOSIS — C44612 Basal cell carcinoma of skin of right upper limb, including shoulder: Secondary | ICD-10-CM | POA: Diagnosis not present

## 2021-01-04 DIAGNOSIS — L820 Inflamed seborrheic keratosis: Secondary | ICD-10-CM | POA: Diagnosis not present

## 2021-01-05 ENCOUNTER — Encounter (INDEPENDENT_AMBULATORY_CARE_PROVIDER_SITE_OTHER): Payer: Self-pay | Admitting: Bariatrics

## 2021-01-05 ENCOUNTER — Other Ambulatory Visit: Payer: Self-pay

## 2021-01-05 ENCOUNTER — Ambulatory Visit (INDEPENDENT_AMBULATORY_CARE_PROVIDER_SITE_OTHER): Payer: BC Managed Care – PPO | Admitting: Bariatrics

## 2021-01-05 VITALS — BP 113/69 | HR 66 | Temp 98.4°F | Ht 72.0 in | Wt 284.0 lb

## 2021-01-05 DIAGNOSIS — Z9189 Other specified personal risk factors, not elsewhere classified: Secondary | ICD-10-CM

## 2021-01-05 DIAGNOSIS — Z6841 Body Mass Index (BMI) 40.0 and over, adult: Secondary | ICD-10-CM

## 2021-01-05 DIAGNOSIS — K760 Fatty (change of) liver, not elsewhere classified: Secondary | ICD-10-CM

## 2021-01-05 DIAGNOSIS — E559 Vitamin D deficiency, unspecified: Secondary | ICD-10-CM | POA: Diagnosis not present

## 2021-01-05 MED ORDER — VITAMIN D (ERGOCALCIFEROL) 1.25 MG (50000 UNIT) PO CAPS: 50000.0000 [IU] | ORAL_CAPSULE | ORAL | 0 refills | Status: DC

## 2021-01-06 NOTE — Progress Notes (Signed)
Chief Complaint:   OBESITY Javier King is here to discuss his progress with his obesity treatment plan along with follow-up of his obesity related diagnoses. Javier King is on the Category 1 Plan and states he is following his eating plan approximately 85-90% of the time. Javier King states he is walking 4000-5000 steps  5 times per week.  Today's visit was #: 4 Starting weight: 300 lbs Starting date: 12/08/2020 Today's weight: 284 lbs Today's date:01/05/2021 Total lbs lost to date: 16 lbs Total lbs lost since last in-office visit: 8 lbs  Interim History: Javier King is down another 8 lbs, since his last visit. He states that he does well with the plan overall. He is getting adequate water and significant protein.  Subjective:   1. Vitamin D insufficiency Javier King is currently taking Vitamin D.  2. Hepatic steatosis Javier King denies abdominal pain.  3. At risk for activity intolerance Javier King is at risk for activity intolerance due to obesity and weather.  Assessment/Plan:   1. Vitamin D insufficiency Low Vitamin D level contributes to fatigue and are associated with obesity, breast, and colon cancer. We will refill prescription Vitamin D 50,000 IU every week for 1 month with no refills and Javier King will follow-up for routine testing of Vitamin D, at least 2-3 times per year to avoid over-replacement.  - Vitamin D, Ergocalciferol, (DRISDOL) 1.25 MG (50000 UNIT) CAPS capsule; Take 1 capsule (50,000 Units total) by mouth every 7 (seven) days.  Dispense: 4 capsule; Refill: 0  2. Hepatic steatosis Javier King will continue to work on diet and exercise.  3. At risk for activity intolerance Javier King was given approximately 15 minutes of exercise intolerance counseling today. He is 65 y.o. male and has risk factors exercise intolerance including obesity. We discussed intensive lifestyle modifications today with an emphasis on specific weight loss instructions and strategies. Javier King will slowly increase  activity as tolerated.  Repetitive spaced learning was employed today to elicit superior memory formation and behavioral change.   4. Obesity, current BMI 38.5 Javier King is currently in the action stage of change. As such, his goal is to continue with weight loss efforts. He has agreed to the Category 1 Plan.   Javier King will continue meal planning. He will adhere closely to the plan. He signed up for "My Fitness Pal".  Exercise goals:  As is, Javier King will increase exercise.  Behavioral modification strategies: increasing lean protein intake, decreasing simple carbohydrates, increasing vegetables, increasing water intake, decreasing eating out, no skipping meals, meal planning and cooking strategies, keeping healthy foods in the home, and planning for success.  Javier King has agreed to follow-up with our clinic in 2 weeks. He was informed of the importance of frequent follow-up visits to maximize his success with intensive lifestyle modifications for his multiple health conditions.   Objective:   Blood pressure 113/69, pulse 66, temperature 98.4 F (36.9 C), height 6' (1.829 m), weight 284 lb (128.8 kg), SpO2 98 %. Body mass index is 38.52 kg/m.  General: Cooperative, alert, well developed, in no acute distress. HEENT: Conjunctivae and lids unremarkable. Cardiovascular: Regular rhythm.  Lungs: Normal work of breathing. Neurologic: No focal deficits.   Lab Results  Component Value Date   CREATININE 1.01 12/08/2020   BUN 6 (L) 12/08/2020   NA 137 12/08/2020   K 5.0 12/08/2020   CL 100 12/08/2020   CO2 23 12/08/2020   Lab Results  Component Value Date   ALT 52 (H) 12/08/2020   AST 44 (H) 12/08/2020   ALKPHOS  56 12/08/2020   BILITOT 0.7 12/08/2020   Lab Results  Component Value Date   HGBA1C 5.5 12/08/2020   Lab Results  Component Value Date   INSULIN 6.1 12/08/2020   Lab Results  Component Value Date   TSH 2.370 12/08/2020   Lab Results  Component Value Date   CHOL 134  11/17/2020   HDL 45.70 11/17/2020   LDLCALC 65 11/17/2020   LDLDIRECT 84.0 10/05/2020   TRIG 119.0 11/17/2020   CHOLHDL 3 11/17/2020   Lab Results  Component Value Date   VD25OH 27.9 (L) 12/08/2020   VD25OH 24.36 (L) 02/11/2019   Lab Results  Component Value Date   WBC 4.8 10/05/2020   HGB 13.8 10/05/2020   HCT 39.5 10/05/2020   MCV 90.9 10/05/2020   PLT 247.0 10/05/2020   No results found for: IRON, TIBC, FERRITIN  Attestation Statements:   Reviewed by clinician on day of visit: allergies, medications, problem list, medical history, surgical history, family history, social history, and previous encounter notes.  I, Lizbeth Bark, RMA, am acting as Location manager for [employer], DO.   I have reviewed the above documentation for accuracy and completeness, and I agree with the above. Jearld Lesch, DO

## 2021-01-07 DIAGNOSIS — D0361 Melanoma in situ of right upper limb, including shoulder: Secondary | ICD-10-CM | POA: Diagnosis not present

## 2021-01-08 ENCOUNTER — Encounter (INDEPENDENT_AMBULATORY_CARE_PROVIDER_SITE_OTHER): Payer: Self-pay | Admitting: Bariatrics

## 2021-01-27 ENCOUNTER — Other Ambulatory Visit: Payer: Self-pay

## 2021-01-27 ENCOUNTER — Ambulatory Visit (INDEPENDENT_AMBULATORY_CARE_PROVIDER_SITE_OTHER): Payer: BC Managed Care – PPO | Admitting: Bariatrics

## 2021-01-27 ENCOUNTER — Encounter (INDEPENDENT_AMBULATORY_CARE_PROVIDER_SITE_OTHER): Payer: Self-pay | Admitting: Bariatrics

## 2021-01-27 VITALS — BP 145/84 | HR 55 | Temp 97.4°F | Ht 72.0 in | Wt 280.0 lb

## 2021-01-27 DIAGNOSIS — Z6841 Body Mass Index (BMI) 40.0 and over, adult: Secondary | ICD-10-CM

## 2021-01-27 DIAGNOSIS — K760 Fatty (change of) liver, not elsewhere classified: Secondary | ICD-10-CM | POA: Diagnosis not present

## 2021-01-27 DIAGNOSIS — I10 Essential (primary) hypertension: Secondary | ICD-10-CM | POA: Diagnosis not present

## 2021-01-31 ENCOUNTER — Encounter (INDEPENDENT_AMBULATORY_CARE_PROVIDER_SITE_OTHER): Payer: Self-pay | Admitting: Bariatrics

## 2021-01-31 ENCOUNTER — Other Ambulatory Visit: Payer: Self-pay | Admitting: Family Medicine

## 2021-01-31 DIAGNOSIS — C44612 Basal cell carcinoma of skin of right upper limb, including shoulder: Secondary | ICD-10-CM | POA: Diagnosis not present

## 2021-01-31 MED ORDER — PANTOPRAZOLE SODIUM 40 MG PO TBEC: 40.0000 mg | DELAYED_RELEASE_TABLET | Freq: Every day | ORAL | 1 refills | Status: DC

## 2021-01-31 NOTE — Progress Notes (Signed)
Chief Complaint:   OBESITY Javier King is here to discuss his progress with his obesity treatment plan along with follow-up of his obesity related diagnoses. Javier King is on the Category 1 Plan and states he is following his eating plan approximately 100% of the time. Javier King states he is doing 4,000-5,000 steps for 0 6 times per week.  Today's visit was #: 5 Starting weight: 300 lbs Starting date: 12/08/2020 Today's weight: 280 lbs Today's date: 01/27/2021 Total lbs lost to date: 20 lbs Total lbs lost since last in-office visit: 4 lbs  Interim History: Javier King is down 4 lbs since his last visit and doing well overall He has been using My Fitness Pal to track his exercise, but has been adding in calories.  Subjective:   1. Essential hypertension Aadi's hypertension is reasonably well controlled.  2. Hepatic steatosis Javier King is currently not on medications.  Assessment/Plan:   1. Essential hypertension Javier King will continue medication. He is working on healthy weight loss and exercise to improve blood pressure control. We will watch for signs of hypotension as he continues his lifestyle modifications.   2. Hepatic steatosis Javier King will continue to work on plan, exercise and activities.   3. Obesity, current BMI 38 Javier King is currently in the action stage of change. As such, his goal is to continue with weight loss efforts. He has agreed to the Category 1 Plan.   Javier King will continue meal planning. He will continue intentional eating. He will continue to adhere closely to the plan. He will stop adding in his calories for exercise. Recipes for winter handout was given.  Exercise goals: Javier King is doing the elliptical and walking now. He will continue to use My Fitness Pal, but will not add back in the calories.   Behavioral modification strategies: increasing lean protein intake, decreasing simple carbohydrates, increasing vegetables, increasing water intake, decreasing  eating out, no skipping meals, meal planning and cooking strategies, keeping healthy foods in the home, ways to avoid boredom eating, ways to avoid night time snacking, better snacking choices, emotional eating strategies, and planning for success.  Javier King has agreed to follow-up with our clinic in 2 weeks with Mina Marble, NP or Abby Potash PAC.  He was informed of the importance of frequent follow-up visits to maximize his success with intensive lifestyle modifications for his multiple health conditions.   Objective:   Blood pressure (!) 145/84, pulse (!) 55, temperature (!) 97.4 F (36.3 C), height 6' (1.829 m), weight 280 lb (127 kg), SpO2 100 %. Body mass index is 37.97 kg/m.  General: Cooperative, alert, well developed, in no acute distress. HEENT: Conjunctivae and lids unremarkable. Cardiovascular: Regular rhythm.  Lungs: Normal work of breathing. Neurologic: No focal deficits.   Lab Results  Component Value Date   CREATININE 1.01 12/08/2020   BUN 6 (L) 12/08/2020   NA 137 12/08/2020   K 5.0 12/08/2020   CL 100 12/08/2020   CO2 23 12/08/2020   Lab Results  Component Value Date   ALT 52 (H) 12/08/2020   AST 44 (H) 12/08/2020   ALKPHOS 56 12/08/2020   BILITOT 0.7 12/08/2020   Lab Results  Component Value Date   HGBA1C 5.5 12/08/2020   Lab Results  Component Value Date   INSULIN 6.1 12/08/2020   Lab Results  Component Value Date   TSH 2.370 12/08/2020   Lab Results  Component Value Date   CHOL 134 11/17/2020   HDL 45.70 11/17/2020   LDLCALC 65 11/17/2020  LDLDIRECT 84.0 10/05/2020   TRIG 119.0 11/17/2020   CHOLHDL 3 11/17/2020   Lab Results  Component Value Date   VD25OH 27.9 (L) 12/08/2020   VD25OH 24.36 (L) 02/11/2019   Lab Results  Component Value Date   WBC 4.8 10/05/2020   HGB 13.8 10/05/2020   HCT 39.5 10/05/2020   MCV 90.9 10/05/2020   PLT 247.0 10/05/2020   No results found for: IRON, TIBC, FERRITIN  Attestation Statements:    Reviewed by clinician on day of visit: allergies, medications, problem list, medical history, surgical history, family history, social history, and previous encounter notes.  I, Lizbeth Bark, RMA, am acting as Location manager for [employer], DO.   I have reviewed the above documentation for accuracy and completeness, and I agree with the above. Jearld Lesch, DO

## 2021-02-09 ENCOUNTER — Ambulatory Visit: Payer: BC Managed Care – PPO | Attending: Internal Medicine

## 2021-02-09 ENCOUNTER — Other Ambulatory Visit (HOSPITAL_BASED_OUTPATIENT_CLINIC_OR_DEPARTMENT_OTHER): Payer: Self-pay

## 2021-02-09 DIAGNOSIS — Z23 Encounter for immunization: Secondary | ICD-10-CM

## 2021-02-09 MED ORDER — INFLUENZA VAC SPLIT QUAD 0.5 ML IM SUSY
PREFILLED_SYRINGE | INTRAMUSCULAR | 0 refills | Status: DC
  Filled 2021-02-09: qty 0.5, 1d supply, fill #0

## 2021-02-09 NOTE — Progress Notes (Signed)
   Covid-19 Vaccination Clinic  Name:  Javier King    MRN: [id number] DOB: 04-03-1956  02/09/2021  [name] was observed post Covid-19 immunization for 15 minutes without incident. He was provided with Vaccine Information Sheet and instruction to access the V-Safe system.   [name] was instructed to call 911 with any severe reactions post vaccine: Difficulty breathing  Swelling of face and throat  A fast heartbeat  A bad rash all over body  Dizziness and weakness

## 2021-02-14 ENCOUNTER — Ambulatory Visit (INDEPENDENT_AMBULATORY_CARE_PROVIDER_SITE_OTHER): Payer: BC Managed Care – PPO | Admitting: Physician Assistant

## 2021-02-14 ENCOUNTER — Other Ambulatory Visit: Payer: Self-pay

## 2021-02-14 ENCOUNTER — Encounter (INDEPENDENT_AMBULATORY_CARE_PROVIDER_SITE_OTHER): Payer: Self-pay | Admitting: Physician Assistant

## 2021-02-14 VITALS — BP 105/69 | HR 53 | Temp 97.7°F | Ht 72.0 in | Wt 272.0 lb

## 2021-02-14 DIAGNOSIS — E559 Vitamin D deficiency, unspecified: Secondary | ICD-10-CM

## 2021-02-14 DIAGNOSIS — E7849 Other hyperlipidemia: Secondary | ICD-10-CM

## 2021-02-14 DIAGNOSIS — Z6841 Body Mass Index (BMI) 40.0 and over, adult: Secondary | ICD-10-CM

## 2021-02-14 DIAGNOSIS — Z9189 Other specified personal risk factors, not elsewhere classified: Secondary | ICD-10-CM

## 2021-02-14 MED ORDER — VITAMIN D (ERGOCALCIFEROL) 1.25 MG (50000 UNIT) PO CAPS: 50000.0000 [IU] | ORAL_CAPSULE | ORAL | 0 refills | Status: DC

## 2021-02-14 NOTE — Progress Notes (Signed)
Chief Complaint:   OBESITY Javier King is here to discuss his progress with his obesity treatment plan along with follow-up of his obesity related diagnoses. Javier King is on the Category 1 Plan and states he is following his eating plan approximately 80% of the time. Javier King states he is walking 3,000 steps for 25 minutes 6 times per week.  Today's visit was #: 6 Starting weight: 300 lbs Starting date: 12/08/2020 Today's weight: 272 lbs Today's date: 02/14/2021 Total lbs lost to date: 28 Total lbs lost since last in-office visit: 8  Interim History: Javier King did very well with weight loss. He states that "a little diet fatigue has set in". His hunger is controlled.  Subjective:   1. Vitamin D insufficiency Javier King is on Vit D weekly, and he is tolerating it well.  2. Other hyperlipidemia Javier King is not on medications, and he is exercising regularly.  3. At risk for heart disease Javier King is at a higher than average risk for cardiovascular disease due to obesity.   Assessment/Plan:   1. Vitamin D insufficiency Low Vitamin D level contributes to fatigue and are associated with obesity, breast, and colon cancer. We will refill prescription Vitamin D for 1 month. Aser will follow-up for routine testing of Vitamin D, at least 2-3 times per year to avoid over-replacement.  - Vitamin D, Ergocalciferol, (DRISDOL) 1.25 MG (50000 UNIT) CAPS capsule; Take 1 capsule (50,000 Units total) by mouth every 7 (seven) days.  Dispense: 4 capsule; Refill: 0  2. Other hyperlipidemia Cardiovascular risk and specific lipid/LDL goals reviewed. We discussed several lifestyle modifications today. Marcell will continue his meal plan and exercise. Orders and follow up as documented in patient record.   Counseling Intensive lifestyle modifications are the first line treatment for this issue. Dietary changes: Increase soluble fiber. Decrease simple carbohydrates. Exercise changes: Moderate to  vigorous-intensity aerobic activity 150 minutes per week if tolerated. Lipid-lowering medications: see documented in medical record.  3. At risk for heart disease Javier King was given approximately 15 minutes of coronary artery disease prevention counseling today. He is 65 y.o. male and has risk factors for heart disease including obesity. We discussed intensive lifestyle modifications today with an emphasis on specific weight loss instructions and strategies.   Repetitive spaced learning was employed today to elicit superior memory formation and behavioral change.  4. Obesity, current BMI 36.88 Javier King is currently in the action stage of change. As such, his goal is to continue with weight loss efforts. He has agreed to the Category 1 Plan.   Exercise goals: As is.  Behavioral modification strategies: meal planning and cooking strategies and keeping healthy foods in the home.  Crawford has agreed to follow-up with our clinic in 2 weeks. He was informed of the importance of frequent follow-up visits to maximize his success with intensive lifestyle modifications for his multiple health conditions.   Objective:   Blood pressure 105/69, pulse (!) 53, temperature 97.7 F (36.5 C), height 6' (1.829 m), weight 272 lb (123.4 kg), SpO2 98 %. Body mass index is 36.89 kg/m.  General: Cooperative, alert, well developed, in no acute distress. HEENT: Conjunctivae and lids unremarkable. Cardiovascular: Regular rhythm.  Lungs: Normal work of breathing. Neurologic: No focal deficits.   Lab Results  Component Value Date   CREATININE 1.01 12/08/2020   BUN 6 (L) 12/08/2020   NA 137 12/08/2020   K 5.0 12/08/2020   CL 100 12/08/2020   CO2 23 12/08/2020   Lab Results  Component Value Date  ALT 52 (H) 12/08/2020   AST 44 (H) 12/08/2020   ALKPHOS 56 12/08/2020   BILITOT 0.7 12/08/2020   Lab Results  Component Value Date   HGBA1C 5.5 12/08/2020   Lab Results  Component Value Date   INSULIN  6.1 12/08/2020   Lab Results  Component Value Date   TSH 2.370 12/08/2020   Lab Results  Component Value Date   CHOL 134 11/17/2020   HDL 45.70 11/17/2020   LDLCALC 65 11/17/2020   LDLDIRECT 84.0 10/05/2020   TRIG 119.0 11/17/2020   CHOLHDL 3 11/17/2020   Lab Results  Component Value Date   VD25OH 27.9 (L) 12/08/2020   VD25OH 24.36 (L) 02/11/2019   Lab Results  Component Value Date   WBC 4.8 10/05/2020   HGB 13.8 10/05/2020   HCT 39.5 10/05/2020   MCV 90.9 10/05/2020   PLT 247.0 10/05/2020   No results found for: IRON, TIBC, FERRITIN  Attestation Statements:   Reviewed by clinician on day of visit: allergies, medications, problem list, medical history, surgical history, family history, social history, and previous encounter notes.   Wilhemena Durie, am acting as transcriptionist for [employer], PA-C.  I have reviewed the above documentation for accuracy and completeness, and I agree with the above. Abby Potash, PA-C

## 2021-02-15 ENCOUNTER — Other Ambulatory Visit (HOSPITAL_BASED_OUTPATIENT_CLINIC_OR_DEPARTMENT_OTHER): Payer: Self-pay

## 2021-02-15 MED ORDER — COVID-19MRNA BIVAL VACC PFIZER 30 MCG/0.3ML IM SUSP
INTRAMUSCULAR | 0 refills | Status: DC
  Filled 2021-02-15: qty 0.3, 1d supply, fill #0

## 2021-02-22 ENCOUNTER — Other Ambulatory Visit (INDEPENDENT_AMBULATORY_CARE_PROVIDER_SITE_OTHER): Payer: Self-pay | Admitting: Physician Assistant

## 2021-02-22 DIAGNOSIS — L905 Scar conditions and fibrosis of skin: Secondary | ICD-10-CM | POA: Diagnosis not present

## 2021-02-22 DIAGNOSIS — D0361 Melanoma in situ of right upper limb, including shoulder: Secondary | ICD-10-CM | POA: Diagnosis not present

## 2021-02-22 DIAGNOSIS — L989 Disorder of the skin and subcutaneous tissue, unspecified: Secondary | ICD-10-CM | POA: Diagnosis not present

## 2021-02-22 DIAGNOSIS — E559 Vitamin D deficiency, unspecified: Secondary | ICD-10-CM

## 2021-02-26 ENCOUNTER — Other Ambulatory Visit (INDEPENDENT_AMBULATORY_CARE_PROVIDER_SITE_OTHER): Payer: Self-pay | Admitting: Physician Assistant

## 2021-02-26 DIAGNOSIS — E559 Vitamin D deficiency, unspecified: Secondary | ICD-10-CM

## 2021-02-28 NOTE — Telephone Encounter (Signed)
LAST APPOINTMENT DATE: 02/14/21 NEXT APPOINTMENT DATE: 03/15/21   [facility], [place] [facility] [address] [place] [address] Phone: [phone] Fax: [phone]  Patient is requesting a refill of the following medications: Requested Prescriptions   Pending Prescriptions Disp Refills   Vitamin D, Ergocalciferol, (DRISDOL) 1.25 MG (50000 UNIT) CAPS capsule [Pharmacy Med Name: VITAMIN D2 CAP 50,000IU] 4 capsule 0    Sig: TAKE 1 CAPSULE EVERY 7 DAYS    Date last filled: 02/14/21 Previously prescribed by Audria Nine, PA-C  Lab Results  Component Value Date   HGBA1C 5.5 12/08/2020   Lab Results  Component Value Date   LDLCALC 65 11/17/2020   CREATININE 1.01 12/08/2020   Lab Results  Component Value Date   VD25OH 27.9 (L) 12/08/2020   VD25OH 24.36 (L) 02/11/2019    BP Readings from Last 3 Encounters:  02/14/21 105/69  01/27/21 (!) 145/84  01/05/21 113/69

## 2021-03-02 ENCOUNTER — Ambulatory Visit (INDEPENDENT_AMBULATORY_CARE_PROVIDER_SITE_OTHER): Payer: BC Managed Care – PPO | Admitting: Physician Assistant

## 2021-03-05 ENCOUNTER — Other Ambulatory Visit: Payer: Self-pay | Admitting: Family Medicine

## 2021-03-05 DIAGNOSIS — G4700 Insomnia, unspecified: Secondary | ICD-10-CM

## 2021-03-14 ENCOUNTER — Ambulatory Visit (INDEPENDENT_AMBULATORY_CARE_PROVIDER_SITE_OTHER): Payer: BC Managed Care – PPO | Admitting: Physician Assistant

## 2021-03-15 ENCOUNTER — Ambulatory Visit (INDEPENDENT_AMBULATORY_CARE_PROVIDER_SITE_OTHER): Payer: BC Managed Care – PPO | Admitting: Bariatrics

## 2021-03-15 ENCOUNTER — Encounter (INDEPENDENT_AMBULATORY_CARE_PROVIDER_SITE_OTHER): Payer: Self-pay | Admitting: Bariatrics

## 2021-03-15 ENCOUNTER — Other Ambulatory Visit: Payer: Self-pay

## 2021-03-15 VITALS — BP 139/76 | HR 54 | Temp 97.6°F | Ht 72.0 in | Wt 265.0 lb

## 2021-03-15 DIAGNOSIS — E559 Vitamin D deficiency, unspecified: Secondary | ICD-10-CM | POA: Diagnosis not present

## 2021-03-15 DIAGNOSIS — I10 Essential (primary) hypertension: Secondary | ICD-10-CM | POA: Diagnosis not present

## 2021-03-15 DIAGNOSIS — Z6841 Body Mass Index (BMI) 40.0 and over, adult: Secondary | ICD-10-CM

## 2021-03-15 MED ORDER — VITAMIN D (ERGOCALCIFEROL) 1.25 MG (50000 UNIT) PO CAPS: 50000.0000 [IU] | ORAL_CAPSULE | ORAL | 0 refills | Status: DC

## 2021-03-16 NOTE — Progress Notes (Signed)
Chief Complaint:   OBESITY Javier King is here to discuss his progress with his obesity treatment plan along with follow-up of his obesity related diagnoses. [name] is on the Category 1 Plan and states he is following his eating plan approximately 90% of the time. Ewart states he is walking 15 minutes 5 times per week.  Today's visit was #: 7 Starting weight: 300 lbs Starting date: 12/08/2020 Today's weight: 265 lbs Today's date: 03/15/2021 Total lbs lost to date: 35 Total lbs lost since last in-office visit: 7  Interim History: [name] is down another 7 lbs since his last visit.  Subjective:   1. Vitamin D insufficiency He is currently taking prescription vitamin D 50,000 IU each week. He denies nausea, vomiting or muscle weakness.  2. Essential hypertension Pt's BP is controlled.  Assessment/Plan:   1. Vitamin D insufficiency Low Vitamin D level contributes to fatigue and are associated with obesity, breast, and colon cancer. He agrees to continue to take prescription Vitamin D 50,000 IU every week and will follow-up for routine testing of Vitamin D, at least 2-3 times per year to avoid over-replacement.  Refill- Vitamin D, Ergocalciferol, (DRISDOL) 1.25 MG (50000 UNIT) CAPS capsule; Take 1 capsule (50,000 Units total) by mouth every 7 (seven) days.  Dispense: 4 capsule; Refill: 0  2. Essential hypertension [name] is working on healthy weight loss and exercise to improve blood pressure control. We will watch for signs of hypotension as he continues his lifestyle modifications. Continue current treatment plan.  3. Obesity, current BMI 36  Javier King is currently in the action stage of change. As such, his goal is to continue with weight loss efforts. He has agreed to the Category 1 Plan.   Meal planning and intentional eating.  Handout: Eating Out Guide  Exercise goals:  As is  Behavioral modification strategies: increasing lean protein intake, decreasing simple  carbohydrates, increasing vegetables, increasing water intake, decreasing eating out, no skipping meals, meal planning and cooking strategies, keeping healthy foods in the home, and planning for success.  Javier King has agreed to follow-up with our clinic in 3 weeks. He was informed of the importance of frequent follow-up visits to maximize his success with intensive lifestyle modifications for his multiple health conditions.   Objective:   Blood pressure 139/76, pulse (!) 54, temperature 97.6 F (36.4 C), height 6' (1.829 m), weight 265 lb (120.2 kg), SpO2 96 %. Body mass index is 35.94 kg/m.  General: Cooperative, alert, well developed, in no acute distress. HEENT: Conjunctivae and lids unremarkable. Cardiovascular: Regular rhythm.  Lungs: Normal work of breathing. Neurologic: No focal deficits.   Lab Results  Component Value Date   CREATININE 1.01 12/08/2020   BUN 6 (L) 12/08/2020   NA 137 12/08/2020   K 5.0 12/08/2020   CL 100 12/08/2020   CO2 23 12/08/2020   Lab Results  Component Value Date   ALT 52 (H) 12/08/2020   AST 44 (H) 12/08/2020   ALKPHOS 56 12/08/2020   BILITOT 0.7 12/08/2020   Lab Results  Component Value Date   HGBA1C 5.5 12/08/2020   Lab Results  Component Value Date   INSULIN 6.1 12/08/2020   Lab Results  Component Value Date   TSH 2.370 12/08/2020   Lab Results  Component Value Date   CHOL 134 11/17/2020   HDL 45.70 11/17/2020   LDLCALC 65 11/17/2020   LDLDIRECT 84.0 10/05/2020   TRIG 119.0 11/17/2020   CHOLHDL 3 11/17/2020   Lab Results  Component  Value Date   VD25OH 27.9 (L) 12/08/2020   VD25OH 24.36 (L) 02/11/2019   Lab Results  Component Value Date   WBC 4.8 10/05/2020   HGB 13.8 10/05/2020   HCT 39.5 10/05/2020   MCV 90.9 10/05/2020   PLT 247.0 10/05/2020    Attestation Statements:   Reviewed by clinician on day of visit: allergies, medications, problem list, medical history, surgical history, family history, social history,  and previous encounter notes.  Coral Ceo, CMA, am acting as Location manager for [employer], DO.  I have reviewed the above documentation for accuracy and completeness, and I agree with the above. Jearld Lesch, DO

## 2021-03-17 ENCOUNTER — Encounter (INDEPENDENT_AMBULATORY_CARE_PROVIDER_SITE_OTHER): Payer: Self-pay | Admitting: Bariatrics

## 2021-03-30 ENCOUNTER — Encounter (INDEPENDENT_AMBULATORY_CARE_PROVIDER_SITE_OTHER): Payer: Self-pay | Admitting: Bariatrics

## 2021-03-30 ENCOUNTER — Other Ambulatory Visit: Payer: Self-pay

## 2021-03-30 ENCOUNTER — Ambulatory Visit (INDEPENDENT_AMBULATORY_CARE_PROVIDER_SITE_OTHER): Payer: BC Managed Care – PPO | Admitting: Bariatrics

## 2021-03-30 VITALS — BP 128/79 | HR 50 | Temp 97.4°F | Ht 72.0 in | Wt 261.0 lb

## 2021-03-30 DIAGNOSIS — E7849 Other hyperlipidemia: Secondary | ICD-10-CM

## 2021-03-30 DIAGNOSIS — Z6841 Body Mass Index (BMI) 40.0 and over, adult: Secondary | ICD-10-CM

## 2021-03-30 DIAGNOSIS — I10 Essential (primary) hypertension: Secondary | ICD-10-CM

## 2021-03-30 NOTE — Progress Notes (Signed)
Chief Complaint:   OBESITY Javier King is here to discuss his progress with his obesity treatment plan along with follow-up of his obesity related diagnoses. Javier King is on the Category 1 Plan and states he is following his eating plan approximately 100% of the time. Javier King states he is walking for 40 minutes 7 times per week.  Today's visit was #: 8 Starting weight: 300 lbs Starting date: 12/08/2020 Today's weight: 261 lbs Today's date: 03/30/2021 Total lbs lost to date: 39 lbs Total lbs lost since last in-office visit: 4 lbs  Interim History: Javier King is down another 4 lbs since his last visit.   Subjective:   1. Essential hypertension Javier King is currently taking [place], Zestril and Lopressor.   2. Other hyperlipidemia Javier King is not on medications currently.   Assessment/Plan:   1. Essential hypertension Javier King will continue medications. He is working on healthy weight loss and exercise to improve blood pressure control. We will watch for signs of hypotension as he continues his lifestyle modifications.  2. Other hyperlipidemia Cardiovascular risk and specific lipid/LDL goals reviewed.  We discussed several lifestyle modifications today and Javier King will continue to work on diet, exercise and weight loss efforts. Javier King will continue the plan and he will increase activities and exercise. Orders and follow up as documented in patient record.   Counseling Intensive lifestyle modifications are the first line treatment for this issue. Dietary changes: Increase soluble fiber. Decrease simple carbohydrates. Exercise changes: Moderate to vigorous-intensity aerobic activity 150 minutes per week if tolerated. Lipid-lowering medications: see documented in medical record.   3. Obesity, current BMI 35.5 Javier King is currently in the action stage of change. As such, his goal is to continue with weight loss efforts. He has agreed to the Category 1 Plan.   Javier King will continue meal  planning. He will be mindful eating. Strategies for the holidays were provided today.   Exercise goals:  As is.  Behavioral modification strategies: increasing lean protein intake, decreasing simple carbohydrates, increasing vegetables, increasing water intake, decreasing eating out, no skipping meals, meal planning and cooking strategies, keeping healthy foods in the home, and planning for success.  Javier King has agreed to follow-up with our clinic in 2-3 weeks (fasting). He was informed of the importance of frequent follow-up visits to maximize his success with intensive lifestyle modifications for his multiple health conditions.   Objective:   Blood pressure 128/79, pulse (!) 50, temperature (!) 97.4 F (36.3 C), height 6' (1.829 m), weight 261 lb (118.4 kg), SpO2 100 %. Body mass index is 35.4 kg/m.  General: Cooperative, alert, well developed, in no acute distress. HEENT: Conjunctivae and lids unremarkable. Cardiovascular: Regular rhythm.  Lungs: Normal work of breathing. Neurologic: No focal deficits.   Lab Results  Component Value Date   CREATININE 1.01 12/08/2020   BUN 6 (L) 12/08/2020   NA 137 12/08/2020   K 5.0 12/08/2020   CL 100 12/08/2020   CO2 23 12/08/2020   Lab Results  Component Value Date   ALT 52 (H) 12/08/2020   AST 44 (H) 12/08/2020   ALKPHOS 56 12/08/2020   BILITOT 0.7 12/08/2020   Lab Results  Component Value Date   HGBA1C 5.5 12/08/2020   Lab Results  Component Value Date   INSULIN 6.1 12/08/2020   Lab Results  Component Value Date   TSH 2.370 12/08/2020   Lab Results  Component Value Date   CHOL 134 11/17/2020   HDL 45.70 11/17/2020   LDLCALC 65 11/17/2020  LDLDIRECT 84.0 10/05/2020   TRIG 119.0 11/17/2020   CHOLHDL 3 11/17/2020   Lab Results  Component Value Date   VD25OH 27.9 (L) 12/08/2020   VD25OH 24.36 (L) 02/11/2019   Lab Results  Component Value Date   WBC 4.8 10/05/2020   HGB 13.8 10/05/2020   HCT 39.5 10/05/2020    MCV 90.9 10/05/2020   PLT 247.0 10/05/2020   No results found for: IRON, TIBC, FERRITIN  Attestation Statements:   Reviewed by clinician on day of visit: allergies, medications, problem list, medical history, surgical history, family history, social history, and previous encounter notes.  I, Lizbeth Bark, RMA, am acting as Location manager for [employer], DO.   I have reviewed the above documentation for accuracy and completeness, and I agree with the above. Jearld Lesch, DO

## 2021-04-01 ENCOUNTER — Encounter (INDEPENDENT_AMBULATORY_CARE_PROVIDER_SITE_OTHER): Payer: Self-pay | Admitting: Bariatrics

## 2021-04-04 ENCOUNTER — Other Ambulatory Visit: Payer: Self-pay | Admitting: Family Medicine

## 2021-04-04 DIAGNOSIS — I10 Essential (primary) hypertension: Secondary | ICD-10-CM

## 2021-04-08 ENCOUNTER — Encounter: Payer: Self-pay | Admitting: Family Medicine

## 2021-04-08 ENCOUNTER — Other Ambulatory Visit: Payer: Self-pay

## 2021-04-08 ENCOUNTER — Ambulatory Visit (INDEPENDENT_AMBULATORY_CARE_PROVIDER_SITE_OTHER): Payer: BC Managed Care – PPO | Admitting: Family Medicine

## 2021-04-08 VITALS — BP 128/80 | HR 55 | Temp 97.5°F | Ht 73.0 in | Wt 263.0 lb

## 2021-04-08 DIAGNOSIS — G4700 Insomnia, unspecified: Secondary | ICD-10-CM

## 2021-04-08 DIAGNOSIS — Z1211 Encounter for screening for malignant neoplasm of colon: Secondary | ICD-10-CM | POA: Diagnosis not present

## 2021-04-08 DIAGNOSIS — E782 Mixed hyperlipidemia: Secondary | ICD-10-CM

## 2021-04-08 DIAGNOSIS — I10 Essential (primary) hypertension: Secondary | ICD-10-CM

## 2021-04-08 NOTE — Progress Notes (Signed)
Chief Complaint  Patient presents with   Follow-up    6 month    Subjective Javier King is a 65 y.o. male who presents for hypertension follow up. He does not monitor home blood pressures. He is compliant with medications- lisinopril 40 mg/d, chlorthalidone 25 mg/d, metoprolol 25 mg bid. Patient has these side effects of medication: none He is adhering to a healthy diet overall. Current exercise: walking No CP or SOB.  Insomnia Hx of insomnia, takes Ambien 5 mg qhs prn. No AE's. Has been using more recently due to stress at work.    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   Cancer ([place]) [id number]   skin cancer   DDD (degenerative disc disease), lumbar    Depression 01/14/2016   Edema of both lower extremities    Erectile dysfunction    Essential hypertension 01/14/2016   Hepatic steatosis    History of TIA (transient ischemic attack)    per pt/ unsure if had TIA    Hyperlipidemia    Lower back pain    Pericarditis 1986   Tubular adenoma of colon    02/17/10    Exam BP 128/80   Pulse (!) 55   Temp (!) 97.5 F (36.4 C) (Oral)   Ht 6\' 1"  (1.854 m)   Wt 263 lb (119.3 kg)   SpO2 94%   BMI 34.70 kg/m  General:  well developed, well nourished, in no apparent distress Heart: Reg rhythm, bradycardic, no bruits, no LE edema Lungs: clear to auscultation, no accessory muscle use Psych: well oriented with normal range of affect and appropriate judgment/insight  Essential hypertension  Mixed hyperlipidemia  Insomnia, unspecified type  Screen for colon cancer - Plan: Ambulatory referral to Gastroenterology  Chronic, stable. Cont lisinopril 40 mg/d, chlorthalidone 25 mg/d, metoprolol 25 mg bid. Monitor BP at home as he cont's to lose wt. Counseled on diet and exercise. Improved. Chronic, stable. Cont Ambien 5 mg prn.  F/u in 6 mo for CPE. The patient voiced  understanding and agreement to the plan.  [place], DO 04/08/21  3:15 PM

## 2021-04-08 NOTE — Patient Instructions (Addendum)
Very very strong work with your weight loss.   Monitor blood pressure at home around 2-3 times per month. If <110/70 consistently, please let me know.   Keep the diet clean and stay active.  Let us know if you need anything.

## 2021-04-18 ENCOUNTER — Other Ambulatory Visit: Payer: Self-pay | Admitting: Family Medicine

## 2021-04-18 DIAGNOSIS — G4700 Insomnia, unspecified: Secondary | ICD-10-CM

## 2021-04-18 MED ORDER — TRAZODONE HCL 100 MG PO TABS: 100.0000 mg | ORAL_TABLET | Freq: Every day | ORAL | 3 refills | Status: DC

## 2021-04-20 ENCOUNTER — Ambulatory Visit (INDEPENDENT_AMBULATORY_CARE_PROVIDER_SITE_OTHER): Payer: BC Managed Care – PPO | Admitting: Family Medicine

## 2021-04-20 ENCOUNTER — Other Ambulatory Visit: Payer: Self-pay

## 2021-04-20 ENCOUNTER — Encounter (INDEPENDENT_AMBULATORY_CARE_PROVIDER_SITE_OTHER): Payer: Self-pay | Admitting: Family Medicine

## 2021-04-20 VITALS — BP 147/73 | HR 51 | Temp 97.3°F | Ht 73.0 in | Wt 255.0 lb

## 2021-04-20 DIAGNOSIS — Z6841 Body Mass Index (BMI) 40.0 and over, adult: Secondary | ICD-10-CM

## 2021-04-20 DIAGNOSIS — E559 Vitamin D deficiency, unspecified: Secondary | ICD-10-CM | POA: Diagnosis not present

## 2021-04-20 DIAGNOSIS — E8881 Metabolic syndrome: Secondary | ICD-10-CM

## 2021-04-20 DIAGNOSIS — E782 Mixed hyperlipidemia: Secondary | ICD-10-CM | POA: Diagnosis not present

## 2021-04-20 DIAGNOSIS — E88819 Insulin resistance, unspecified: Secondary | ICD-10-CM

## 2021-04-20 MED ORDER — VITAMIN D (ERGOCALCIFEROL) 1.25 MG (50000 UNIT) PO CAPS: 50000.0000 [IU] | ORAL_CAPSULE | ORAL | 0 refills | Status: DC

## 2021-04-20 NOTE — Progress Notes (Signed)
Chief Complaint:   OBESITY Javier King is here to discuss his progress with his obesity treatment plan along with follow-up of his obesity related diagnoses. Javier King is on the Category 1 Plan and states he is following his eating plan approximately 80% of the time. Javier King states he is walking for 60 minutes 7 times per week.  Today's visit was #: 9 Starting weight: 300 lbs Starting date: 12/08/2020 Today's weight: 255 lbs Today's date: 04/20/2021 Total lbs lost to date: 45 lbs Total lbs lost since last in-office visit: 6 lbs  Interim History: Javier King had some off plan food over the past few weeks. He is satisfied with food options overall. He has done a great job with plan adherence since starting our program in July and has lost 45 lbs. He notes his activity level has increased tremendously since starting at our clinic.  Subjective:   1. Mixed hyperlipidemia Javier King's LDL was at goal June 2022. He takes low dose ASA daily. He is not on statin.   Lab Results  Component Value Date   CHOL 134 11/17/2020   HDL 45.70 11/17/2020   LDLCALC 65 11/17/2020   LDLDIRECT 84.0 10/05/2020   TRIG 119.0 11/17/2020   CHOLHDL 3 11/17/2020   Lab Results  Component Value Date   ALT 52 (H) 12/08/2020   AST 44 (H) 12/08/2020   ALKPHOS 56 12/08/2020   BILITOT 0.7 12/08/2020   The 10-year ASCVD risk score (Arnett DK, et al., 2019) is: 12.5%   Values used to calculate the score:     Age: 65 years     Sex: Male     Is Non-Hispanic African American: No     Diabetic: No     Tobacco smoker: No     Systolic Blood Pressure: 326 mmHg     Is BP treated: Yes     HDL Cholesterol: 45.7 mg/dL     Total Cholesterol: 134 mg/dL   2. Vitamin D deficiency Javier King's Vitamin D is low at 27.9. He is on weekly prescription Vitamin D.   Lab Results  Component Value Date   VD25OH 27.9 (L) 12/08/2020   VD25OH 24.36 (L) 02/11/2019    3. Insulin resistance Javier King has extremely mild Insulin resistance.  His last fast insulin was 6.1. He denies polyphagia.  Lab Results  Component Value Date   INSULIN 6.1 12/08/2020   Lab Results  Component Value Date   HGBA1C 5.5 12/08/2020    Assessment/Plan:   1. Mixed hyperlipidemia  We will check FLP today. - Lipid Panel With LDL/HDL Ratio  2. Vitamin D deficiency  We will check labs today. We will refill prescription Vitamin D 50,000 IU every week and Javier King will follow-up for routine testing of Vitamin D, at least 2-3 times per year to avoid over-replacement.  - VITAMIN D 25 Hydroxy (Vit-D Deficiency, Fractures) - Vitamin D, Ergocalciferol, (DRISDOL) 1.25 MG (50000 UNIT) CAPS capsule; Take 1 capsule (50,000 Units total) by mouth every 7 (seven) days.  Dispense: 4 capsule; Refill: 0  3. Insulin resistance  We will check labs today.  - Comprehensive metabolic panel - Hemoglobin A1c - Insulin, random  4. Obesity, current BMI 33.65 Javier King is currently in the action stage of change. As such, his goal is to continue with weight loss efforts. He has agreed to the Category 1 Plan and keeping a food journal and adhering to recommended goals of 400-500 calories and 35 grams of protein at supper.  Exercise goals:  Javier King will  add resistance training 2 times per week. Discussed exploring [employer] exercise videos.  Behavioral modification strategies: meal planning and cooking strategies.  Javier King has agreed to follow-up with our clinic in 2-3 weeks.  Objective:   Blood pressure (!) 147/73, pulse (!) 51, temperature (!) 97.3 F (36.3 C), height 6\' 1"  (1.854 m), weight 255 lb (115.7 kg), SpO2 95 %. Body mass index is 33.64 kg/m.  General: Cooperative, alert, well developed, in no acute distress. HEENT: Conjunctivae and lids unremarkable. Cardiovascular: Regular rhythm.  Lungs: Normal work of breathing. Neurologic: No focal deficits.   Lab Results  Component Value Date   CREATININE 1.01 12/08/2020   BUN 6 (L) 12/08/2020   NA 137  12/08/2020   K 5.0 12/08/2020   CL 100 12/08/2020   CO2 23 12/08/2020   Lab Results  Component Value Date   ALT 52 (H) 12/08/2020   AST 44 (H) 12/08/2020   ALKPHOS 56 12/08/2020   BILITOT 0.7 12/08/2020   Lab Results  Component Value Date   HGBA1C 5.5 12/08/2020   Lab Results  Component Value Date   INSULIN 6.1 12/08/2020   Lab Results  Component Value Date   TSH 2.370 12/08/2020   Lab Results  Component Value Date   CHOL 134 11/17/2020   HDL 45.70 11/17/2020   LDLCALC 65 11/17/2020   LDLDIRECT 84.0 10/05/2020   TRIG 119.0 11/17/2020   CHOLHDL 3 11/17/2020   Lab Results  Component Value Date   VD25OH 27.9 (L) 12/08/2020   VD25OH 24.36 (L) 02/11/2019   Lab Results  Component Value Date   WBC 4.8 10/05/2020   HGB 13.8 10/05/2020   HCT 39.5 10/05/2020   MCV 90.9 10/05/2020   PLT 247.0 10/05/2020   No results found for: IRON, TIBC, FERRITIN  Attestation Statements:   Reviewed by clinician on day of visit: allergies, medications, problem list, medical history, surgical history, family history, social history, and previous encounter notes.  I, Lizbeth Bark, RMA, am acting as Location manager for [employer], [place].   I have reviewed the above documentation for accuracy and completeness, and I agree with the above. -  Georgianne Fick, FNP

## 2021-04-21 ENCOUNTER — Encounter (INDEPENDENT_AMBULATORY_CARE_PROVIDER_SITE_OTHER): Payer: Self-pay | Admitting: Family Medicine

## 2021-04-21 DIAGNOSIS — Z6841 Body Mass Index (BMI) 40.0 and over, adult: Secondary | ICD-10-CM | POA: Insufficient documentation

## 2021-04-21 LAB — COMPREHENSIVE METABOLIC PANEL
ALT: 20 IU/L (ref 0–44)
AST: 17 IU/L (ref 0–40)
Albumin/Globulin Ratio: 2.3 — ABNORMAL HIGH (ref 1.2–2.2)
Albumin: 4.8 g/dL (ref 3.8–4.8)
Alkaline Phosphatase: 60 IU/L (ref 44–121)
BUN/Creatinine Ratio: 14 (ref 10–24)
BUN: 16 mg/dL (ref 8–27)
Bilirubin Total: 0.9 mg/dL (ref 0.0–1.2)
CO2: 26 mmol/L (ref 20–29)
Calcium: 10.1 mg/dL (ref 8.6–10.2)
Chloride: 95 mmol/L — ABNORMAL LOW (ref 96–106)
Creatinine, Ser: 1.13 mg/dL (ref 0.76–1.27)
Globulin, Total: 2.1 g/dL (ref 1.5–4.5)
Glucose: 87 mg/dL (ref 70–99)
Potassium: 4.6 mmol/L (ref 3.5–5.2)
Sodium: 135 mmol/L (ref 134–144)
Total Protein: 6.9 g/dL (ref 6.0–8.5)
eGFR: 73 mL/min/{1.73_m2} (ref >59)

## 2021-04-21 LAB — INSULIN, RANDOM: INSULIN: 5.4 u[IU]/mL (ref 2.6–24.9)

## 2021-04-21 LAB — VITAMIN D 25 HYDROXY (VIT D DEFICIENCY, FRACTURES): Vit D, 25-Hydroxy: 62.5 ng/mL (ref 30.0–100.0)

## 2021-04-21 LAB — LIPID PANEL WITH LDL/HDL RATIO
Cholesterol, Total: 110 mg/dL (ref 100–199)
HDL: 34 mg/dL — ABNORMAL LOW (ref >39)
LDL Chol Calc (NIH): 57 mg/dL (ref 0–99)
LDL/HDL Ratio: 1.7 ratio (ref 0.0–3.6)
Triglycerides: 102 mg/dL (ref 0–149)
VLDL Cholesterol Cal: 19 mg/dL (ref 5–40)

## 2021-04-21 LAB — HEMOGLOBIN A1C
Est. average glucose Bld gHb Est-mCnc: 108 mg/dL
Hgb A1c MFr Bld: 5.4 % (ref 4.8–5.6)

## 2021-04-24 ENCOUNTER — Other Ambulatory Visit: Payer: Self-pay | Admitting: Family Medicine

## 2021-04-24 DIAGNOSIS — I10 Essential (primary) hypertension: Secondary | ICD-10-CM

## 2021-05-11 ENCOUNTER — Encounter (INDEPENDENT_AMBULATORY_CARE_PROVIDER_SITE_OTHER): Payer: Self-pay | Admitting: Physician Assistant

## 2021-05-11 ENCOUNTER — Telehealth (INDEPENDENT_AMBULATORY_CARE_PROVIDER_SITE_OTHER): Payer: BC Managed Care – PPO | Admitting: Physician Assistant

## 2021-05-11 DIAGNOSIS — Z6841 Body Mass Index (BMI) 40.0 and over, adult: Secondary | ICD-10-CM

## 2021-05-11 DIAGNOSIS — E559 Vitamin D deficiency, unspecified: Secondary | ICD-10-CM | POA: Diagnosis not present

## 2021-05-11 DIAGNOSIS — E8881 Metabolic syndrome: Secondary | ICD-10-CM

## 2021-05-11 MED ORDER — VITAMIN D3 50 MCG (2000 UT) PO CAPS: ORAL_CAPSULE | ORAL | Status: DC

## 2021-05-11 NOTE — Progress Notes (Signed)
TeleHealth Visit:  Due to the COVID-19 pandemic, this visit was completed with telemedicine (audio/video) technology to reduce patient and provider exposure as well as to preserve personal protective equipment.   Javier King has verbally consented to this TeleHealth visit. The patient is located at home, the provider is located at the [facility] office. The participants in this visit include the listed provider and patient. The visit was conducted today via video.   Chief Complaint: OBESITY Javier King is here to discuss his progress with his obesity treatment plan along with follow-up of his obesity related diagnoses. Javier King is on the Category 1 Plan and keeping a food journal and adhering to recommended goals of 400-500 calories and 45 grams protein with supper and states he is following his eating plan approximately 20% of the time. Javier King states he is walking 20 minutes 1 times per week.  Today's visit was #: 10 Starting weight: 300 lbs Starting date: 12/08/2020  Interim History: Javier King is at home today with the flu. He notes feeling better today than yesterday. Prior to getting sick, he attended holiday parties and was indulging at times.  Subjective:   1. Vitamin D deficiency Bransyn's last level was at goal. He is on Vit D 50,000 IU weekly.  2. Insulin resistance Pt's insulin and A1c have improved and pt denies polyphagia.  Assessment/Plan:   1. Vitamin D deficiency Low Vitamin D level contributes to fatigue and are associated with obesity, breast, and colon cancer. He agrees to discontinue prescription Vit D and start OTC Vitamin D 4,000 IU daily and will follow-up for routine testing of Vitamin D, at least 2-3 times per year to avoid over-replacement.  Refill- Cholecalciferol (VITAMIN D3) 50 MCG (2000 UT) capsule; Take 2 capsules by mouth daily  Dispense: 60 capsule  2. Insulin resistance Javier King will continue to work on weight loss, exercise, and decreasing  simple carbohydrates to help decrease the risk of diabetes. Javier King agreed to follow-up with [place] as directed to closely monitor his progress. Continue with plan and exercise.  3. Obesity, current BMI 33.65  Javier King is currently in the action stage of change. As such, his goal is to continue with weight loss efforts. He has agreed to the Category 1 Plan.   Exercise goals:  As is  Behavioral modification strategies: meal planning and cooking strategies.  Javier King has agreed to follow-up with our clinic in 3 weeks. He was informed of the importance of frequent follow-up visits to maximize his success with intensive lifestyle modifications for his multiple health conditions.  Objective:   VITALS: Per patient if applicable, see vitals. GENERAL: Alert and in no acute distress. CARDIOPULMONARY: No increased WOB. Speaking in clear sentences.  PSYCH: Pleasant and cooperative. Speech normal rate and rhythm. Affect is appropriate. Insight and judgement are appropriate. Attention is focused, linear, and appropriate.  NEURO: Oriented as arrived to appointment on time with no prompting.   Lab Results  Component Value Date   CREATININE 1.13 04/20/2021   BUN 16 04/20/2021   NA 135 04/20/2021   K 4.6 04/20/2021   CL 95 (L) 04/20/2021   CO2 26 04/20/2021   Lab Results  Component Value Date   ALT 20 04/20/2021   AST 17 04/20/2021   ALKPHOS 60 04/20/2021   BILITOT 0.9 04/20/2021   Lab Results  Component Value Date   HGBA1C 5.4 04/20/2021   HGBA1C 5.5 12/08/2020   Lab Results  Component Value Date   INSULIN 5.4 04/20/2021  INSULIN 6.1 12/08/2020   Lab Results  Component Value Date   TSH 2.370 12/08/2020   Lab Results  Component Value Date   CHOL 110 04/20/2021   HDL 34 (L) 04/20/2021   LDLCALC 57 04/20/2021   LDLDIRECT 84.0 10/05/2020   TRIG 102 04/20/2021   CHOLHDL 3 11/17/2020   Lab Results  Component Value Date   VD25OH 62.5 04/20/2021   VD25OH 27.9 (L) 12/08/2020   VD25OH  24.36 (L) 02/11/2019   Lab Results  Component Value Date   WBC 4.8 10/05/2020   HGB 13.8 10/05/2020   HCT 39.5 10/05/2020   MCV 90.9 10/05/2020   PLT 247.0 10/05/2020   No results found for: IRON, TIBC, FERRITIN  Attestation Statements:   Reviewed by clinician on day of visit: allergies, medications, problem list, medical history, surgical history, family history, social history, and previous encounter notes.  Coral Ceo, CMA, am acting as transcriptionist for [employer], PA-C.  I have reviewed the above documentation for accuracy and completeness, and I agree with the above. Abby Potash, PA-C

## 2021-06-07 ENCOUNTER — Ambulatory Visit (INDEPENDENT_AMBULATORY_CARE_PROVIDER_SITE_OTHER): Payer: BC Managed Care – PPO | Admitting: Family Medicine

## 2021-06-08 ENCOUNTER — Ambulatory Visit (INDEPENDENT_AMBULATORY_CARE_PROVIDER_SITE_OTHER): Payer: BC Managed Care – PPO | Admitting: Family Medicine

## 2021-06-08 ENCOUNTER — Other Ambulatory Visit: Payer: Self-pay

## 2021-06-08 ENCOUNTER — Encounter (INDEPENDENT_AMBULATORY_CARE_PROVIDER_SITE_OTHER): Payer: Self-pay | Admitting: Family Medicine

## 2021-06-08 VITALS — BP 124/68 | HR 46 | Temp 97.5°F | Ht 73.0 in | Wt 246.0 lb

## 2021-06-08 DIAGNOSIS — Z6832 Body mass index (BMI) 32.0-32.9, adult: Secondary | ICD-10-CM

## 2021-06-08 DIAGNOSIS — F509 Eating disorder, unspecified: Secondary | ICD-10-CM | POA: Diagnosis not present

## 2021-06-08 DIAGNOSIS — E65 Localized adiposity: Secondary | ICD-10-CM

## 2021-06-08 NOTE — Progress Notes (Signed)
Chief Complaint:   OBESITY Javier King is here to discuss his progress with his obesity treatment plan along with follow-up of his obesity related diagnoses. Javier King is on the Category 1 Plan and keeping a food journal and adhering to recommended goals of 400-500 calories and 35 grams of protein at supper and states he is following his eating plan approximately 80% of the time. Javier King states he is walking and doing free weights 3 times per week for 20 minutes 5 times per week.  Today's visit was #: 11 Starting weight: 300 lbs Starting date: 12/08/2020 Today's weight: 246 lbs Today's date: 06/08/2021 Total lbs lost to date: 54 lbs Total lbs lost since last in-office visit: 9 lbs  Interim History: Javier King is happy with meal plan but notes cravings at night. This started 3-4 weeks ago (when he got over the flu). He overeats his snack foods. He does not keep unhealthy food in the house.  Subjective:   1. Eating disorder/emotional eating Javier King notes cravings at night the last few weeks.  2. Visceral obesity Javier King's liver enzymes are now normal. He has hx of hepatic steatosis. His visceral obesity rating has improved. Was 61 and is now 85.  Assessment/Plan:   1. Eating disorder/emotional eating Javier King will add 2 ounces protein at supper.  He declined Rx for topiramate.  2. Visceral obesity Javier King will continue to work on weight loss.   3. Obesity, current BMI 32.46 Javier King is currently in the action stage of change. As such, his goal is to continue with weight loss efforts. He has agreed to the Category 1 Plan.   Javier King will add 2 more ounces of meat at dinner (8 ounces) or have protein exchange.   Exercise goals: For substantial health benefits, adults should do at least 150 minutes (2 hours and 30 minutes) a week of moderate-intensity, or 75 minutes (1 hour and 15 minutes) a week of vigorous-intensity aerobic physical activity, or an equivalent combination of moderate- and  vigorous-intensity aerobic activity. Aerobic activity should be performed in episodes of at least 10 minutes, and preferably, it should be spread throughout the week. Javier King will do resistance training 3 times per week.  Behavioral modification strategies: increasing lean protein intake and decreasing simple carbohydrates.  Javier King has agreed to follow-up with our clinic in 3 weeks.  Objective:   Blood pressure 124/68, pulse (!) 46, temperature (!) 97.5 F (36.4 C), height 6\' 1"  (1.854 m), weight 246 lb (111.6 kg), SpO2 100 %. Body mass index is 32.46 kg/m.  General: Cooperative, alert, well developed, in no acute distress. HEENT: Conjunctivae and lids unremarkable. Cardiovascular: Regular rhythm.  Lungs: Normal work of breathing. Neurologic: No focal deficits.   Lab Results  Component Value Date   CREATININE 1.13 04/20/2021   BUN 16 04/20/2021   NA 135 04/20/2021   K 4.6 04/20/2021   CL 95 (L) 04/20/2021   CO2 26 04/20/2021   Lab Results  Component Value Date   ALT 20 04/20/2021   AST 17 04/20/2021   ALKPHOS 60 04/20/2021   BILITOT 0.9 04/20/2021   Lab Results  Component Value Date   HGBA1C 5.4 04/20/2021   HGBA1C 5.5 12/08/2020   Lab Results  Component Value Date   INSULIN 5.4 04/20/2021   INSULIN 6.1 12/08/2020   Lab Results  Component Value Date   TSH 2.370 12/08/2020   Lab Results  Component Value Date   CHOL 110 04/20/2021   HDL 34 (L) 04/20/2021   [place]  57 04/20/2021   LDLDIRECT 84.0 10/05/2020   TRIG 102 04/20/2021   CHOLHDL 3 11/17/2020   Lab Results  Component Value Date   VD25OH 62.5 04/20/2021   VD25OH 27.9 (L) 12/08/2020   VD25OH 24.36 (L) 02/11/2019   Lab Results  Component Value Date   WBC 4.8 10/05/2020   HGB 13.8 10/05/2020   HCT 39.5 10/05/2020   MCV 90.9 10/05/2020   PLT 247.0 10/05/2020   No results found for: IRON, TIBC, FERRITIN  Attestation Statements:   Reviewed by clinician on day of visit: allergies, medications,  problem list, medical history, surgical history, family history, social history, and previous encounter notes.  I, Lizbeth Bark, RMA, am acting as Location manager for [employer], [place].  I have reviewed the above documentation for accuracy and completeness, and I agree with the above. -  Georgianne Fick, FNP

## 2021-06-29 ENCOUNTER — Other Ambulatory Visit: Payer: Self-pay

## 2021-06-29 ENCOUNTER — Ambulatory Visit (INDEPENDENT_AMBULATORY_CARE_PROVIDER_SITE_OTHER): Payer: BC Managed Care – PPO | Admitting: Bariatrics

## 2021-06-29 ENCOUNTER — Encounter (INDEPENDENT_AMBULATORY_CARE_PROVIDER_SITE_OTHER): Payer: Self-pay | Admitting: Bariatrics

## 2021-06-29 VITALS — BP 118/55 | HR 50 | Temp 97.6°F | Ht 73.0 in | Wt 243.0 lb

## 2021-06-29 DIAGNOSIS — E8881 Metabolic syndrome: Secondary | ICD-10-CM

## 2021-06-29 DIAGNOSIS — E782 Mixed hyperlipidemia: Secondary | ICD-10-CM

## 2021-06-29 DIAGNOSIS — F5089 Other specified eating disorder: Secondary | ICD-10-CM

## 2021-06-29 DIAGNOSIS — E669 Obesity, unspecified: Secondary | ICD-10-CM

## 2021-06-29 DIAGNOSIS — Z6832 Body mass index (BMI) 32.0-32.9, adult: Secondary | ICD-10-CM

## 2021-06-29 MED ORDER — BUPROPION HCL ER (SR) 150 MG PO TB12: 150.0000 mg | ORAL_TABLET | Freq: Every day | ORAL | 0 refills | Status: DC

## 2021-06-30 NOTE — Progress Notes (Signed)
Chief Complaint:   OBESITY Javier King is here to discuss his progress with his obesity treatment plan along with follow-up of his obesity related diagnoses. Javier King is on the Category 1 Plan and states he is following his eating plan approximately 90% of the time. Javier King states he is walking for 30 minutes 7 times per week.  Today's visit was #: 12 Starting weight: 300 lbs Starting date: 12/08/2020 Today's weight: 243 lbs Today's date: 06/29/2021 Total lbs lost to date: 57 lbs Total lbs lost since last in-office visit: 3 lbs  Interim History: Javier King is down an additional 3 lbs and doing well overall. He feels like he has hit a plateau in his weight loss and is having more cravings after dinner. His goal weight equals about 210 lbs.   Subjective:   1. Mixed hyperlipidemia Javier King is currently not on medications.  2. Insulin resistance Javier King is not on medications currently.  3. Other disorder of eating Javier King notes stress eating.  Assessment/Plan:   1. Mixed hyperlipidemia Cardiovascular risk and specific lipid/LDL goals reviewed.  Javier King will continue to work with the plan on weight loss and activities. We discussed several lifestyle modifications today and Javier King will continue to work on diet, exercise and weight loss efforts. Orders and follow up as documented in patient record.   Counseling Intensive lifestyle modifications are the first line treatment for this issue. Dietary changes: Increase soluble fiber. Decrease simple carbohydrates. Exercise changes: Moderate to vigorous-intensity aerobic activity 150 minutes per week if tolerated. Lipid-lowering medications: see documented in medical record.  2. Insulin resistance Javier King will keep activities high. He will keep carbohydrates low. He will continue to work on weight loss, exercise, and decreasing simple carbohydrates to help decrease the risk of diabetes. Javier King agreed to follow-up with [place] as directed to closely  monitor his progress.  3. Other disorder of eating We will refill bupropion SR 150 mg for 1 month with no refills. Behavior modification techniques were discussed today to help Javier King deal with his emotional/non-hunger eating behaviors.  Orders and follow up as documented in patient record.   - buPROPion (WELLBUTRIN SR) 150 MG 12 hr tablet; Take 1 tablet (150 mg total) by mouth daily.  Dispense: 30 tablet; Refill: 0  4. Obesity, current BMI 32.46 Javier King is currently in the action stage of change. As such, his goal is to continue with weight loss efforts. He has agreed to the Category 1 Plan.   Javier King will continue meal planning and he will continue intentional eating. He will increase his water intake.  Exercise goals:  As is.  Behavioral modification strategies: increasing lean protein intake, decreasing simple carbohydrates, increasing vegetables, increasing water intake, decreasing eating out, no skipping meals, meal planning and cooking strategies, keeping healthy foods in the home, and planning for success.  Kule has agreed to follow-up with our clinic in 3-4 weeks with myself or Javier Potash PA-C. He was informed of the importance of frequent follow-up visits to maximize his success with intensive lifestyle modifications for his multiple health conditions.   Objective:   Blood pressure (!) 118/55, pulse (!) 50, temperature 97.6 F (36.4 C), height 6\' 1"  (1.854 m), weight 243 lb (110.2 kg), SpO2 100 %. Body mass index is 32.06 kg/m.  General: Cooperative, alert, well developed, in no acute distress. HEENT: Conjunctivae and lids unremarkable. Cardiovascular: Regular rhythm.  Lungs: Normal work of breathing. Neurologic: No focal deficits.   Lab Results  Component Value Date   CREATININE 1.13 04/20/2021  BUN 16 04/20/2021   NA 135 04/20/2021   K 4.6 04/20/2021   CL 95 (L) 04/20/2021   CO2 26 04/20/2021   Lab Results  Component Value Date   ALT 20 04/20/2021   AST  17 04/20/2021   ALKPHOS 60 04/20/2021   BILITOT 0.9 04/20/2021   Lab Results  Component Value Date   HGBA1C 5.4 04/20/2021   HGBA1C 5.5 12/08/2020   Lab Results  Component Value Date   INSULIN 5.4 04/20/2021   INSULIN 6.1 12/08/2020   Lab Results  Component Value Date   TSH 2.370 12/08/2020   Lab Results  Component Value Date   CHOL 110 04/20/2021   HDL 34 (L) 04/20/2021   LDLCALC 57 04/20/2021   LDLDIRECT 84.0 10/05/2020   TRIG 102 04/20/2021   CHOLHDL 3 11/17/2020   Lab Results  Component Value Date   VD25OH 62.5 04/20/2021   VD25OH 27.9 (L) 12/08/2020   VD25OH 24.36 (L) 02/11/2019   Lab Results  Component Value Date   WBC 4.8 10/05/2020   HGB 13.8 10/05/2020   HCT 39.5 10/05/2020   MCV 90.9 10/05/2020   PLT 247.0 10/05/2020   No results found for: IRON, TIBC, FERRITIN  Attestation Statements:   Reviewed by clinician on day of visit: allergies, medications, problem list, medical history, surgical history, family history, social history, and previous encounter notes.  I, Lizbeth Bark, RMA, am acting as Location manager for [employer], [place].  I have reviewed the above documentation for accuracy and completeness, and I agree with the above. Jearld Lesch, DO

## 2021-07-03 ENCOUNTER — Encounter (INDEPENDENT_AMBULATORY_CARE_PROVIDER_SITE_OTHER): Payer: Self-pay | Admitting: Bariatrics

## 2021-07-14 ENCOUNTER — Other Ambulatory Visit (INDEPENDENT_AMBULATORY_CARE_PROVIDER_SITE_OTHER): Payer: Self-pay | Admitting: Bariatrics

## 2021-07-14 ENCOUNTER — Other Ambulatory Visit (INDEPENDENT_AMBULATORY_CARE_PROVIDER_SITE_OTHER): Payer: Self-pay | Admitting: Family Medicine

## 2021-07-14 ENCOUNTER — Other Ambulatory Visit: Payer: Self-pay | Admitting: Family Medicine

## 2021-07-14 DIAGNOSIS — F5089 Other specified eating disorder: Secondary | ICD-10-CM

## 2021-07-14 DIAGNOSIS — G4700 Insomnia, unspecified: Secondary | ICD-10-CM

## 2021-07-14 DIAGNOSIS — E559 Vitamin D deficiency, unspecified: Secondary | ICD-10-CM

## 2021-07-14 NOTE — Telephone Encounter (Signed)
Dr.Brown 

## 2021-07-14 NOTE — Telephone Encounter (Signed)
Last OV--04/08/21 Last RF--03/07/21--#90 with 1 refill

## 2021-07-21 ENCOUNTER — Encounter (INDEPENDENT_AMBULATORY_CARE_PROVIDER_SITE_OTHER): Payer: Self-pay | Admitting: Bariatrics

## 2021-07-21 ENCOUNTER — Ambulatory Visit (INDEPENDENT_AMBULATORY_CARE_PROVIDER_SITE_OTHER): Payer: BC Managed Care – PPO | Admitting: Bariatrics

## 2021-07-21 ENCOUNTER — Other Ambulatory Visit: Payer: Self-pay

## 2021-07-21 ENCOUNTER — Other Ambulatory Visit: Payer: Self-pay | Admitting: Family Medicine

## 2021-07-21 VITALS — BP 125/73 | HR 46 | Temp 98.0°F | Ht 73.0 in | Wt 238.0 lb

## 2021-07-21 DIAGNOSIS — E669 Obesity, unspecified: Secondary | ICD-10-CM

## 2021-07-21 DIAGNOSIS — I10 Essential (primary) hypertension: Secondary | ICD-10-CM

## 2021-07-21 DIAGNOSIS — Z6831 Body mass index (BMI) 31.0-31.9, adult: Secondary | ICD-10-CM

## 2021-07-21 DIAGNOSIS — F5089 Other specified eating disorder: Secondary | ICD-10-CM

## 2021-07-21 MED ORDER — BUPROPION HCL ER (SR) 150 MG PO TB12: 150.0000 mg | ORAL_TABLET | Freq: Every day | ORAL | 0 refills | Status: DC

## 2021-07-21 NOTE — Progress Notes (Signed)
? ? ? ?Chief Complaint:  ? ?OBESITY ?Javier King is here to discuss his progress with his obesity treatment plan along with follow-up of his obesity related diagnoses. Javier King is on the Category 1 Plan and states he is following his eating plan approximately 70% of the time. Javier King states he is walking for 20 minutes 5 times per week. ? ?Today's visit was #: 13 ?Starting weight: 300 lbs ?Starting date: 12/08/2020 ?Today's weight: 238 lbs ?Today's date: 07/21/2021 ?Total lbs lost to date: 62 lbs ?Total lbs lost since last in-office visit: 5 lbs ? ?Interim History: Javier King is down an additional 5 lbs and doing well overall. He has been working a lot and went out to dinner. ? ?Subjective:  ? ?1. Essential hypertension ?Javier King's blood pressure is controlled. His last blood pressure was 118/55. ? ?2. Other disorder of eating ?Javier King is taking is medications as directed.  ? ?Assessment/Plan:  ? ?1. Essential hypertension ?March will continue taking his medications. He is working on healthy weight loss and exercise to improve blood pressure control. We will watch for signs of hypotension as he continues his lifestyle modifications. ? ?2. Other disorder of eating ?We will refill Wellbutrin SR 150 mg for 1 month with no refills. Behavior modification techniques were discussed today to help Izell deal with his emotional/non-hunger eating behaviors.  Orders and follow up as documented in patient record.  ? ?- buPROPion (WELLBUTRIN SR) 150 MG 12 hr tablet; Take 1 tablet (150 mg total) by mouth daily.  Dispense: 30 tablet; Refill: 0 ? ?3. Obesity, current BMI 31.4 ?Javier King is currently in the action stage of change. As such, his goal is to continue with weight loss efforts. He has agreed to the Category 1 Plan.  ? ?Javier King will continue meal planning and he will continue intentional eating. He will keep his water and protein intake high.  ? ?Exercise goals:  As is. Javier King will decrease activities 20-25%. ? ?Behavioral  modification strategies: increasing lean protein intake, decreasing simple carbohydrates, increasing vegetables, increasing water intake, decreasing eating out, no skipping meals, meal planning and cooking strategies, keeping healthy foods in the home, and planning for success. ? ?Javier King has agreed to follow-up with our clinic in 3-4 weeks. He was informed of the importance of frequent follow-up visits to maximize his success with intensive lifestyle modifications for his multiple health conditions.  ? ?Objective:  ? ?Blood pressure 125/73, pulse (!) 46, temperature 98 ?F (36.7 ?C), height 6\' 1"  (1.854 m), weight 238 lb (108 kg), SpO2 98 %. ?Body mass index is 31.4 kg/m?. ? ?General: Cooperative, alert, well developed, in no acute distress. ?HEENT: Conjunctivae and lids unremarkable. ?Cardiovascular: Regular rhythm.  ?Lungs: Normal work of breathing. ?Neurologic: No focal deficits.  ? ?Lab Results  ?Component Value Date  ? CREATININE 1.13 04/20/2021  ? BUN 16 04/20/2021  ? NA 135 04/20/2021  ? K 4.6 04/20/2021  ? CL 95 (L) 04/20/2021  ? CO2 26 04/20/2021  ? ?Lab Results  ?Component Value Date  ? ALT 20 04/20/2021  ? AST 17 04/20/2021  ? ALKPHOS 60 04/20/2021  ? BILITOT 0.9 04/20/2021  ? ?Lab Results  ?Component Value Date  ? HGBA1C 5.4 04/20/2021  ? HGBA1C 5.5 12/08/2020  ? ?Lab Results  ?Component Value Date  ? INSULIN 5.4 04/20/2021  ? INSULIN 6.1 12/08/2020  ? ?Lab Results  ?Component Value Date  ? TSH 2.370 12/08/2020  ? ?Lab Results  ?Component Value Date  ? CHOL 110 04/20/2021  ? HDL 34 (  L) 04/20/2021  ? [place] 57 04/20/2021  ? LDLDIRECT 84.0 10/05/2020  ? TRIG 102 04/20/2021  ? CHOLHDL 3 11/17/2020  ? ?Lab Results  ?Component Value Date  ? VD25OH 62.5 04/20/2021  ? VD25OH 27.9 (L) 12/08/2020  ? VD25OH 24.36 (L) 02/11/2019  ? ?Lab Results  ?Component Value Date  ? WBC 4.8 10/05/2020  ? HGB 13.8 10/05/2020  ? HCT 39.5 10/05/2020  ? MCV 90.9 10/05/2020  ? PLT 247.0 10/05/2020  ? ?No results found for: IRON,  TIBC, FERRITIN ? ?Attestation Statements:  ? ?Reviewed by clinician on day of visit: allergies, medications, problem list, medical history, surgical history, family history, social history, and previous encounter notes. ? ?I, Lizbeth Bark, RMA, am acting as transcriptionist for [employer], [place]. ? ?I have reviewed the above documentation for accuracy and completeness, and I agree with the above. Jearld Lesch, DO ? ?

## 2021-07-24 ENCOUNTER — Encounter (INDEPENDENT_AMBULATORY_CARE_PROVIDER_SITE_OTHER): Payer: Self-pay | Admitting: Bariatrics

## 2021-08-09 ENCOUNTER — Other Ambulatory Visit (INDEPENDENT_AMBULATORY_CARE_PROVIDER_SITE_OTHER): Payer: Self-pay | Admitting: Bariatrics

## 2021-08-09 DIAGNOSIS — F5089 Other specified eating disorder: Secondary | ICD-10-CM

## 2021-08-11 ENCOUNTER — Telehealth (INDEPENDENT_AMBULATORY_CARE_PROVIDER_SITE_OTHER): Payer: BC Managed Care – PPO | Admitting: Medical

## 2021-08-11 VITALS — BP 125/73

## 2021-08-11 DIAGNOSIS — R059 Cough, unspecified: Secondary | ICD-10-CM

## 2021-08-11 DIAGNOSIS — U071 COVID-19: Secondary | ICD-10-CM

## 2021-08-11 MED ORDER — MOLNUPIRAVIR EUA 200MG CAPSULE: 4.0000 | ORAL_CAPSULE | Freq: Two times a day (BID) | ORAL | 0 refills | Status: AC

## 2021-08-11 MED ORDER — FLUTICASONE PROPIONATE 50 MCG/ACT NA SUSP: 2.0000 | Freq: Every day | NASAL | 1 refills | Status: DC

## 2021-08-11 MED ORDER — BENZONATATE 100 MG PO CAPS: 100.0000 mg | ORAL_CAPSULE | Freq: Three times a day (TID) | ORAL | 0 refills | Status: DC | PRN

## 2021-08-11 NOTE — Patient Instructions (Signed)
COVID infection with symptoms for 2 days.  Symptoms moderate and patient vaccinated x4.  COVID risk score is 4.  No oxygen saturation but did counsel on benefit of getting O2 sat monitor.  No shortness of breath or wheezing noted. ? ?Continue current vitamin D. ? ?Prescribing Flonase nasal spray for nasal congestion.  Benzonatate prescribed for cough and prescribing molnupiravir antiviral. ? ?Counseled on how to and quarantine. ? ?If signs symptoms worsen or change let us know.  If any secondary signs infection or bronchitis like symptoms then could prescribe antibiotic and possibly get chest x-ray. ? ?If symptoms resolving but fatigue lingers could use N-acetylcysteine(NAC) over-the-counter.  Wait a week and see how doing with the tea before potentially starting. ? ?Follow-up in 7 to 10 days or sooner if needed. ?

## 2021-08-11 NOTE — Progress Notes (Signed)
? ?  Subjective:  ? ? Patient ID: Javier King, male    DOB: July 29, 1955, 66 y.o.   MRN: [id number] ? ?HPI ? ?Virtual Visit via Video Note ? ?I connected with Javier King on 08/11/21 at 10:00 AM EDT by a video enabled telemedicine application and verified that I am speaking with the correct person using two identifiers. ? ?Location: ?Patient: home ?Provider: office ?  ?I discussed the limitations of evaluation and management by telemedicine and the availability of in person appointments. The patient expressed understanding and agreed to proceed. ? ?History of Present Illness: ?Pt recently diagnosed with covid  today. ? ?Test used-otc this am + ? ?History of covid vaccine x 4. ? ?Covid risk score- 4 ? ?History of covid infection-no ? ?Current symptoms- symptoms since Tuesday. St, dry cough,body aches, decreased taste, runny nose and sore eyes.  No sob or wheezing. ? ?Pt 02 sat-no.  ? ?Recent travel over the weekend. ?  ?  ?Observations/Objective: ? ?General-no acute distress, pleasant, oriented. ?Lungs- on inspection lungs appear unlabored. ?Neck- no tracheal deviation or jvd on inspection. ?Neuro- gross motor function appears intact.  ?Assessment and Plan: ?Patient Instructions  ?COVID infection with symptoms for 2 days.  Symptoms moderate and patient vaccinated x4.  COVID risk score is 4.  No oxygen saturation but did counsel on benefit of getting O2 sat monitor.  No shortness of breath or wheezing noted. ? ?Continue current vitamin D. ? ?Prescribing Flonase nasal spray for nasal congestion.  Benzonatate prescribed for cough and prescribing molnupiravir antiviral. ? ?Counseled on how to and quarantine. ? ?If signs symptoms worsen or change let us know.  If any secondary signs infection or bronchitis like symptoms then could prescribe antibiotic and possibly get chest x-ray. ? ?If symptoms resolving but fatigue lingers could use N-acetylcysteine(NAC) over-the-counter.  Wait a week and see how doing with the  tea before potentially starting. ? ?Follow-up in 7 to 10 days or sooner if needed.  ? ?Mackie Pai, PA-C  ? ? ?Time spent with patient today was  22 minutes which consisted of chart revdiew, discussing diagnosis, work up treatment and documentation.  ? ? ?Follow Up Instructions: ? ?  ?I discussed the assessment and treatment plan with the patient. The patient was provided an opportunity to ask questions and all were answered. The patient agreed with the plan and demonstrated an understanding of the instructions. ?  ?The patient was advised to call back or seek an in-person evaluation if the symptoms worsen or if the condition fails to improve as anticipated. ? ? ? ? ?Mackie Pai, PA-C  ? ?Review of Systems  ?Constitutional:  Positive for fatigue.  ?HENT:  Positive for congestion, sinus pressure and sinus pain. Negative for sore throat.   ?Respiratory:  Positive for cough. Negative for chest tightness, shortness of breath and wheezing.   ?Cardiovascular:  Negative for chest pain and palpitations.  ?Gastrointestinal:  Negative for abdominal pain.  ?Genitourinary:  Negative for dysuria.  ?Musculoskeletal:  Negative for back pain.  ?Neurological:  Negative for dizziness and light-headedness.  ?Hematological:  Negative for adenopathy. Does not bruise/bleed easily.  ?Psychiatric/Behavioral:  Negative for behavioral problems and confusion.   ? ?   ?Objective:  ? Physical Exam ? ? ? ? ?   ?Assessment & Plan:  ? ? ?

## 2021-08-12 ENCOUNTER — Telehealth: Payer: BC Managed Care – PPO | Admitting: Family Medicine

## 2021-08-15 ENCOUNTER — Encounter (INDEPENDENT_AMBULATORY_CARE_PROVIDER_SITE_OTHER): Payer: Self-pay | Admitting: Bariatrics

## 2021-08-15 ENCOUNTER — Telehealth (INDEPENDENT_AMBULATORY_CARE_PROVIDER_SITE_OTHER): Payer: BC Managed Care – PPO | Admitting: Bariatrics

## 2021-08-15 DIAGNOSIS — E669 Obesity, unspecified: Secondary | ICD-10-CM

## 2021-08-15 DIAGNOSIS — F5089 Other specified eating disorder: Secondary | ICD-10-CM

## 2021-08-15 DIAGNOSIS — E782 Mixed hyperlipidemia: Secondary | ICD-10-CM

## 2021-08-15 DIAGNOSIS — I10 Essential (primary) hypertension: Secondary | ICD-10-CM

## 2021-08-15 DIAGNOSIS — Z6831 Body mass index (BMI) 31.0-31.9, adult: Secondary | ICD-10-CM

## 2021-08-16 ENCOUNTER — Encounter (INDEPENDENT_AMBULATORY_CARE_PROVIDER_SITE_OTHER): Payer: Self-pay | Admitting: Bariatrics

## 2021-08-16 NOTE — Progress Notes (Addendum)
? ? ?TeleHealth Visit:  ?Due to the COVID-19 pandemic, this visit was completed with telemedicine (audio/video) technology to reduce patient and provider exposure as well as to preserve personal protective equipment.  ? ?Javier King has verbally consented to this TeleHealth visit. The patient is located at home, the provider is located at the [facility] office. The participants in this visit include the listed provider and patient. The visit was conducted today via video. ? ?Chief Complaint: OBESITY ?Javier King is here to discuss his progress with his obesity treatment plan along with follow-up of his obesity related diagnoses. Javier King is on the Category 1 Plan and states he is following his eating plan approximately 50% of the time. Javier King states he is doing 0 minutes 0 times per week. ? ?Today's visit was #: 13 ?Starting weight: 300 lbs ?Starting date: 12/08/2020 ? ?Interim History: Javier King states that he is Covid positive and needs a "MyChart" visit. HE states that his weight remains the same.  ? ?Subjective:  ? ?1. Essential hypertension ?Javier King's blood pressure is controlled. His last blood pressure was 118/55. ? ?2. Mixed hyperlipidemia ?Javier King is not on medications currently.  ? ?3. Other disorder of eating ?Javier King has taken Wellbutrin, he states it is not helpful and his cravings are down.  ? ?Assessment/Plan:  ? ?1. Essential hypertension ?Javier King will continue his medications. He will have no salt added. He is working on healthy weight loss and exercise to improve blood pressure control. We will watch for signs of hypotension as he continues his lifestyle modifications. ? ?2. Mixed hyperlipidemia ?Javier King will have no trans fat, he will limit saturated fats to dark chocolate, unprocessed meats and dairy. He will continue to work on weight loss, exercise, and decreasing simple carbohydrates to help decrease the risk of diabetes.  ? ?3. Other disorder of eating ?Javier King will stop taking  Wellbutrin. Behavior modification techniques were discussed today to help Javier King deal with his emotional/non-hunger eating behaviors.  Orders and follow up as documented in patient record.  ? ?4. Obesity, current BMI 31.4 ?Javier King is currently in the action stage of change. As such, his goal is to continue with weight loss efforts. He has agreed to the Category 1 Plan.  ? ?Javier King will continue meal planning and he will continue intentional eating.  ? ?Exercise goals:  Javier King was not exercising while sick, and will remain walking and alternating with biking.  ? ?Behavioral modification strategies: increasing lean protein intake, decreasing simple carbohydrates, increasing vegetables, increasing water intake, decreasing eating out, no skipping meals, meal planning and cooking strategies, keeping healthy foods in the home, and planning for success. ? ?Javier King has agreed to follow-up with our clinic in 3-4 weeks. He was informed of the importance of frequent follow-up visits to maximize his success with intensive lifestyle modifications for his multiple health conditions. ? ?Objective:  ? ?VITALS: Per patient if applicable, see vitals. ?GENERAL: Alert and in no acute distress. ?CARDIOPULMONARY: No increased WOB. Speaking in clear sentences.  ?PSYCH: Pleasant and cooperative. Speech normal rate and rhythm. Affect is appropriate. Insight and judgement are appropriate. Attention is focused, linear, and appropriate.  ?NEURO: Oriented as arrived to appointment on time with no prompting.  ? ?Lab Results  ?Component Value Date  ? CREATININE 1.13 04/20/2021  ? BUN 16 04/20/2021  ? NA 135 04/20/2021  ? K 4.6 04/20/2021  ? CL 95 (L) 04/20/2021  ? CO2 26 04/20/2021  ? ?Lab Results  ?Component Value Date  ? ALT 20 04/20/2021  ?  AST 17 04/20/2021  ? ALKPHOS 60 04/20/2021  ? BILITOT 0.9 04/20/2021  ? ?Lab Results  ?Component Value Date  ? HGBA1C 5.4 04/20/2021  ? HGBA1C 5.5 12/08/2020  ? ?Lab Results  ?Component Value Date  ?  INSULIN 5.4 04/20/2021  ? INSULIN 6.1 12/08/2020  ? ?Lab Results  ?Component Value Date  ? TSH 2.370 12/08/2020  ? ?Lab Results  ?Component Value Date  ? CHOL 110 04/20/2021  ? HDL 34 (L) 04/20/2021  ? Javier King 57 04/20/2021  ? LDLDIRECT 84.0 10/05/2020  ? TRIG 102 04/20/2021  ? CHOLHDL 3 11/17/2020  ? ?Lab Results  ?Component Value Date  ? VD25OH 62.5 04/20/2021  ? VD25OH 27.9 (L) 12/08/2020  ? VD25OH 24.36 (L) 02/11/2019  ? ?Lab Results  ?Component Value Date  ? WBC 4.8 10/05/2020  ? HGB 13.8 10/05/2020  ? HCT 39.5 10/05/2020  ? MCV 90.9 10/05/2020  ? PLT 247.0 10/05/2020  ? ?No results found for: IRON, TIBC, FERRITIN ? ?Attestation Statements:  ? ?Reviewed by clinician on day of visit: allergies, medications, problem list, medical history, surgical history, family history, social history, and previous encounter notes. ? ?Total time spent on visit was 20 minutes.  ? ?I, Lizbeth Bark, RMA, am acting as transcriptionist for [employer], [place]. ? ?I have reviewed the above documentation for accuracy and completeness, and I agree with the above. Jearld Lesch, DO ? ?

## 2021-08-23 ENCOUNTER — Encounter: Payer: Self-pay | Admitting: Family Medicine

## 2021-08-23 ENCOUNTER — Telehealth (INDEPENDENT_AMBULATORY_CARE_PROVIDER_SITE_OTHER): Payer: BC Managed Care – PPO | Admitting: Family Medicine

## 2021-08-23 DIAGNOSIS — R058 Other specified cough: Secondary | ICD-10-CM | POA: Diagnosis not present

## 2021-08-23 MED ORDER — FLUTICASONE PROPIONATE HFA 110 MCG/ACT IN AERO: 2.0000 | INHALATION_SPRAY | Freq: Two times a day (BID) | RESPIRATORY_TRACT | 1 refills | Status: DC

## 2021-08-23 MED ORDER — PROMETHAZINE-DM 6.25-15 MG/5ML PO SYRP: 5.0000 mL | ORAL_SOLUTION | Freq: Four times a day (QID) | ORAL | 0 refills | Status: DC | PRN

## 2021-08-23 NOTE — Progress Notes (Signed)
Chief Complaint  ?Patient presents with  ? Cough  ?  Getting over having Covid a couple weeks ago.  ? ? ?Merleen Nicely here for URI complaints. Due to COVID-19 pandemic, we are interacting via web portal for an electronic face-to-face visit. I verified patient's ID using 2 identifiers. Patient agreed to proceed with visit via this method. Patient is at home, I am at office. Patient and I are present for visit.  ? ?Duration: 2 weeks  ?Associated symptoms: rhinorrhea, continued decreased taste/smell and coughing ?Denies: sinus pain, rhinorrhea, itchy watery eyes, ear pain, ear drainage, sore throat, wheezing, shortness of breath, myalgia, and fevers, N/V/D ?Treatment to date: cough drops ?Sick contacts: none recent ? ?Past Medical History:  ?Diagnosis Date  ? 10 year risk of MI or stroke 7.5% or greater 01/20/2016  ? 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 Cancer [facility]) [phone]  ? skin cancer  ? DDD (degenerative disc disease), lumbar   ? Depression 01/14/2016  ? Edema of both lower extremities   ? Erectile dysfunction   ? Essential hypertension 01/14/2016  ? Hepatic steatosis   ? History of TIA (transient ischemic attack)   ? per pt/ unsure if had TIA   ? Hyperlipidemia   ? Lower back pain   ? Pericarditis 1986  ? Tubular adenoma of colon   ? 02/17/10  ? ? ?Objective ?No conversational dyspnea ?Age appropriate judgment and insight ?Nml affect and mood ? ?Post-viral cough syndrome - Plan: promethazine-dextromethorphan (PROMETHAZINE-DM) 6.25-15 MG/5ML syrup, fluticasone (FLOVENT HFA) 110 MCG/ACT inhaler ? ?Continue to push fluids, practice good hand hygiene, cover mouth when coughing. ?F/u prn. If starting to experience fevers, shaking, or shortness of breath, seek immediate care. ?Pt voiced understanding and agreement to the plan. ? ?Shelda Pal, DO ?08/23/21 ?11:33 AM ? ?

## 2021-09-02 ENCOUNTER — Other Ambulatory Visit: Payer: Self-pay | Admitting: Medical

## 2021-09-21 ENCOUNTER — Other Ambulatory Visit: Payer: Self-pay | Admitting: Medical

## 2021-10-07 ENCOUNTER — Encounter: Payer: Self-pay | Admitting: Family Medicine

## 2021-10-07 ENCOUNTER — Ambulatory Visit (INDEPENDENT_AMBULATORY_CARE_PROVIDER_SITE_OTHER): Payer: BC Managed Care – PPO | Admitting: Family Medicine

## 2021-10-07 VITALS — BP 124/80 | HR 60 | Temp 97.9°F | Ht 73.0 in | Wt 234.5 lb

## 2021-10-07 DIAGNOSIS — Z0000 Encounter for general adult medical examination without abnormal findings: Secondary | ICD-10-CM

## 2021-10-07 DIAGNOSIS — Z125 Encounter for screening for malignant neoplasm of prostate: Secondary | ICD-10-CM | POA: Diagnosis not present

## 2021-10-07 LAB — COMPREHENSIVE METABOLIC PANEL
ALT: 25 U/L (ref 0–53)
AST: 25 U/L (ref 0–37)
Albumin: 4.6 g/dL (ref 3.5–5.2)
Alkaline Phosphatase: 58 U/L (ref 39–117)
BUN: 13 mg/dL (ref 6–23)
CO2: 28 mEq/L (ref 19–32)
Calcium: 10 mg/dL (ref 8.4–10.5)
Chloride: 89 mEq/L — ABNORMAL LOW (ref 96–112)
Creatinine, Ser: 0.96 mg/dL (ref 0.40–1.50)
GFR: 84.03 mL/min (ref >60.00)
Glucose, Bld: 80 mg/dL (ref 70–99)
Potassium: 4.1 mEq/L (ref 3.5–5.1)
Sodium: 126 mEq/L — ABNORMAL LOW (ref 135–145)
Total Bilirubin: 1.2 mg/dL (ref 0.2–1.2)
Total Protein: 7 g/dL (ref 6.0–8.3)

## 2021-10-07 LAB — LIPID PANEL
Cholesterol: 96 mg/dL (ref 0–200)
HDL: 40.7 mg/dL (ref >39.00)
LDL Cholesterol: 44 mg/dL (ref 0–99)
NonHDL: 55.35
Total CHOL/HDL Ratio: 2
Triglycerides: 56 mg/dL (ref 0.0–149.0)
VLDL: 11.2 mg/dL (ref 0.0–40.0)

## 2021-10-07 LAB — CBC
HCT: 37.3 % — ABNORMAL LOW (ref 39.0–52.0)
Hemoglobin: 12.8 g/dL — ABNORMAL LOW (ref 13.0–17.0)
MCHC: 34.3 g/dL (ref 30.0–36.0)
MCV: 86.2 fl (ref 78.0–100.0)
Platelets: 212 10*3/uL (ref 150.0–400.0)
RBC: 4.33 Mil/uL (ref 4.22–5.81)
RDW: 13.4 % (ref 11.5–15.5)
WBC: 9.5 10*3/uL (ref 4.0–10.5)

## 2021-10-07 LAB — PSA: PSA: 1.76 ng/mL (ref 0.10–4.00)

## 2021-10-07 NOTE — Patient Instructions (Signed)
Give [place] 2-3 business days to get the results of your labs back.   Keep the diet clean and stay active.  Please get me a copy of your advanced directive form at your convenience.   Dr. Henrene Pastor: [phone]  Let us know if you need anything.

## 2021-10-07 NOTE — Progress Notes (Signed)
Chief Complaint  Patient presents with   Annual Exam    Well Male Javier King is here for a complete physical.   His last physical was >1 year ago.  Current diet: in general, a "healthy" diet.  Current exercise: walking, some lifting Weight trend: intentionally losing Fatigue out of ordinary? No. Seat belt? Yes.   Advanced directive? Yes to living will.   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   Cancer ([place]) [id number]   skin cancer   DDD (degenerative disc disease), lumbar    Depression 01/14/2016   Edema of both lower extremities    Erectile dysfunction    Essential hypertension 01/14/2016   Hepatic steatosis    History of TIA (transient ischemic attack)    per pt/ unsure if had TIA    Hyperlipidemia    Lower back pain    Pericarditis 1986   Tubular adenoma of colon    02/17/10      Past Surgical History:  Procedure Laterality Date   COLONOSCOPY     OTHER SURGICAL HISTORY  2003,2004   Back surgery/ 2 laminectomies in lower back   TONSILLECTOMY      Medications  Current Outpatient Medications on File Prior to Visit  Medication Sig Dispense Refill   aspirin EC 81 MG tablet Take 81 mg by mouth daily.     chlorthalidone (HYGROTON) 25 MG tablet TAKE 1 TABLET DAILY 90 tablet 1   cyclobenzaprine (FLEXERIL) 5 MG tablet Take 1 tablet (5 mg total) by mouth 2 (two) times daily as needed for muscle spasms. 180 tablet 0   lisinopril (ZESTRIL) 40 MG tablet TAKE 1 TABLET DAILY 90 tablet 1   metoprolol tartrate (LOPRESSOR) 25 MG tablet TAKE 1 TABLET TWICE A DAY 180 tablet 3   pantoprazole (PROTONIX) 40 MG tablet TAKE 1 TABLET DAILY BEFORE BREAKFAST 90 tablet 1   traZODone (DESYREL) 100 MG tablet Take 1 tablet (100 mg total) by mouth at bedtime. 90 tablet 3   zolpidem (AMBIEN) 5  MG tablet TAKE 1 TABLET AT BEDTIME ASNEEDED FOR SLEEP 45 tablet 1   Allergies Allergies  Allergen Reactions   Penicillins Itching    Family History Family History  Problem Relation Age of Onset   Heart disease Mother    Thyroid disease Mother    Cancer Mother    Alzheimer's disease Father    Parkinsonism Father    Diabetes Sister    Leukemia Sister     Review of Systems: Constitutional:  no fevers Eye:  no recent significant change in vision Ear/Nose/Mouth/Throat:  Ears:  no hearing loss Nose/Mouth/Throat:  no complaints of nasal congestion, no sore throat Cardiovascular:  no chest pain Respiratory:  no shortness of breath Gastrointestinal:  no change in bowel habits GU:  Male: negative for dysuria, frequency Musculoskeletal/Extremities:  no joint pain Integumentary (Skin/Breast):  no abnormal skin lesions reported Neurologic:  no headaches Endocrine: No unexpected weight changes Hematologic/Lymphatic:  no abnormal bleeding  Exam BP 124/80   Pulse 60   Temp 97.9 F (36.6 C) (Oral)   Ht '6\' 1"'$  (1.854 m)   Wt 234 lb 8 oz (106.4 kg)   SpO2 99%   BMI 30.94 kg/m  General:  well developed, well nourished, in no apparent distress Skin:  no significant moles,  warts, or growths Head:  no masses, lesions, or tenderness Eyes:  pupils equal and round, sclera anicteric without injection Ears:  canals without lesions, TMs shiny without retraction, no obvious effusion, no erythema Nose:  nares patent, septum midline, mucosa normal Throat/Pharynx:  lips and gingiva without lesion; tongue and uvula midline; non-inflamed pharynx; no exudates or postnasal drainage Neck: neck supple without adenopathy, thyromegaly, or masses Cardiac: RRR, no bruits, no LE edema Lungs:  clear to auscultation, breath sounds equal bilaterally, no respiratory distress Abdomen: BS+, soft, non-tender, non-distended, no masses or organomegaly noted Rectal: Deferred Musculoskeletal:  symmetrical muscle  groups noted without atrophy or deformity Neuro:  gait normal; deep tendon reflexes normal and symmetric Psych: well oriented with normal range of affect and appropriate judgment/insight  Assessment and Plan  Well adult exam - Plan: CBC, Comprehensive metabolic panel, Lipid panel  Screening for prostate cancer - Plan: PSA   Well 66 y.o. male. Counseled on diet and exercise. Counseled on risks and benefits of prostate cancer screening with PSA. The patient agrees to undergo testing. CCS: Dr. Blanch Media number provided in AVS today.  Advanced directive form provided today in case there is a portion he has not filled out.  Immunizations, labs, and further orders as above. Follow up in 6 mo. The patient voiced understanding and agreement to the plan.  [place], DO 10/07/21 1:32 PM

## 2021-10-10 ENCOUNTER — Other Ambulatory Visit: Payer: Self-pay | Admitting: Family Medicine

## 2021-10-10 ENCOUNTER — Other Ambulatory Visit (INDEPENDENT_AMBULATORY_CARE_PROVIDER_SITE_OTHER): Payer: BC Managed Care – PPO

## 2021-10-10 DIAGNOSIS — E611 Iron deficiency: Secondary | ICD-10-CM | POA: Diagnosis not present

## 2021-10-10 DIAGNOSIS — E871 Hypo-osmolality and hyponatremia: Secondary | ICD-10-CM | POA: Diagnosis not present

## 2021-10-11 ENCOUNTER — Other Ambulatory Visit: Payer: Self-pay | Admitting: Family Medicine

## 2021-10-11 LAB — CBC
HCT: 37.1 % — ABNORMAL LOW (ref 39.0–52.0)
Hemoglobin: 12.8 g/dL — ABNORMAL LOW (ref 13.0–17.0)
MCHC: 34.4 g/dL (ref 30.0–36.0)
MCV: 86.4 fl (ref 78.0–100.0)
Platelets: 210 10*3/uL (ref 150.0–400.0)
RBC: 4.29 Mil/uL (ref 4.22–5.81)
RDW: 13.6 % (ref 11.5–15.5)
WBC: 6.1 10*3/uL (ref 4.0–10.5)

## 2021-10-11 LAB — BASIC METABOLIC PANEL
BUN: 14 mg/dL (ref 6–23)
CO2: 30 mEq/L (ref 19–32)
Calcium: 9.7 mg/dL (ref 8.4–10.5)
Chloride: 89 mEq/L — ABNORMAL LOW (ref 96–112)
Creatinine, Ser: 1.04 mg/dL (ref 0.40–1.50)
GFR: 76.33 mL/min (ref >60.00)
Glucose, Bld: 79 mg/dL (ref 70–99)
Potassium: 4.7 mEq/L (ref 3.5–5.1)
Sodium: 125 mEq/L — ABNORMAL LOW (ref 135–145)

## 2021-10-11 LAB — IRON: Iron: 63 ug/dL (ref 42–165)

## 2021-10-11 MED ORDER — HYDROCHLOROTHIAZIDE 25 MG PO TABS: 25.0000 mg | ORAL_TABLET | Freq: Every day | ORAL | 3 refills | Status: DC

## 2021-10-21 ENCOUNTER — Other Ambulatory Visit: Payer: Self-pay | Admitting: Family Medicine

## 2021-10-21 DIAGNOSIS — I10 Essential (primary) hypertension: Secondary | ICD-10-CM

## 2021-11-01 ENCOUNTER — Encounter (INDEPENDENT_AMBULATORY_CARE_PROVIDER_SITE_OTHER): Payer: Self-pay | Admitting: Bariatrics

## 2021-11-01 ENCOUNTER — Ambulatory Visit (INDEPENDENT_AMBULATORY_CARE_PROVIDER_SITE_OTHER): Payer: BC Managed Care – PPO | Admitting: Bariatrics

## 2021-11-01 VITALS — BP 106/66 | HR 75 | Ht 73.0 in | Wt 226.0 lb

## 2021-11-01 DIAGNOSIS — E782 Mixed hyperlipidemia: Secondary | ICD-10-CM

## 2021-11-01 DIAGNOSIS — Z6829 Body mass index (BMI) 29.0-29.9, adult: Secondary | ICD-10-CM

## 2021-11-01 DIAGNOSIS — E669 Obesity, unspecified: Secondary | ICD-10-CM

## 2021-11-01 DIAGNOSIS — I10 Essential (primary) hypertension: Secondary | ICD-10-CM

## 2021-11-02 NOTE — Progress Notes (Signed)
Chief Complaint:   OBESITY Javier King is here to discuss his progress with his obesity treatment plan along with follow-up of his obesity related diagnoses. Javier King is on the Category 1 Plan and states he is following his eating plan approximately 100% of the time. Maxi states he is walking for 60 minutes 7 times per week.  Today's visit was #: 14 Starting weight: 300 lbs Starting date: 12/08/2020 Today's weight: 226 lbs Today's date: 11/01/2021 Total lbs lost to date: 74 lbs Total lbs lost since last in-office visit: 12 lbs  Interim History: Javier King is down another 12 lbs since his last visit. He was last seen on 07/21/2021. He has been on vacation.   Subjective:   1. Essential hypertension Javier King's blood pressure is controlled. His blood pressure today is 106/66.  2. Mixed hyperlipidemia Javier King is not on medications currently.   Assessment/Plan:   1. Essential hypertension Javier King will continue his medications. He is working on healthy weight loss and exercise to improve blood pressure control. We will watch for signs of hypotension as he continues his lifestyle modifications.  2. Mixed hyperlipidemia Cardiovascular risk and specific lipid/LDL goals reviewed.  Javier King will have no trans fats. He will minimize saturated fats except unprocessed meats, dark chocolate, and dairy. We discussed several lifestyle modifications today and Javier King will continue to work on diet, exercise and weight loss efforts. Orders and follow up as documented in patient record.   Counseling Intensive lifestyle modifications are the first line treatment for this issue. Dietary changes: Increase soluble fiber. Decrease simple carbohydrates. Exercise changes: Moderate to vigorous-intensity aerobic activity 150 minutes per week if tolerated. Lipid-lowering medications: see documented in medical record.  3. Obesity, current BMI 29.9 Javier King is currently in the action stage of change. As such, his  goal is to continue with weight loss efforts. He has agreed to the Category 1 Plan.   Javier King will make smart decisions. He will have to not have "all or nothing attitude". Eating Out Sheet was provided today.   Exercise goals:  As is. Javier King will continue 1 hour daily of weight training.   Behavioral modification strategies: increasing lean protein intake, decreasing simple carbohydrates, increasing vegetables, increasing water intake, decreasing eating out, no skipping meals, meal planning and cooking strategies, keeping healthy foods in the home, and planning for success.  Javier King has agreed to follow-up with our clinic in 4 weeks. He was informed of the importance of frequent follow-up visits to maximize his success with intensive lifestyle modifications for his multiple health conditions.   Objective:   Blood pressure 106/66, pulse 75, height '6\' 1"'$  (1.854 m), weight 226 lb (102.5 kg), SpO2 98 %. Body mass index is 29.82 kg/m.  General: Cooperative, alert, well developed, in no acute distress. HEENT: Conjunctivae and lids unremarkable. Cardiovascular: Regular rhythm.  Lungs: Normal work of breathing. Neurologic: No focal deficits.   Lab Results  Component Value Date   CREATININE 1.04 10/10/2021   BUN 14 10/10/2021   NA 125 (L) 10/10/2021   K 4.7 10/10/2021   CL 89 (L) 10/10/2021   CO2 30 10/10/2021   Lab Results  Component Value Date   ALT 25 10/07/2021   AST 25 10/07/2021   ALKPHOS 58 10/07/2021   BILITOT 1.2 10/07/2021   Lab Results  Component Value Date   HGBA1C 5.4 04/20/2021   HGBA1C 5.5 12/08/2020   Lab Results  Component Value Date   INSULIN 5.4 04/20/2021   INSULIN 6.1 12/08/2020   Lab  Results  Component Value Date   TSH 2.370 12/08/2020   Lab Results  Component Value Date   CHOL 96 10/07/2021   HDL 40.70 10/07/2021   LDLCALC 44 10/07/2021   LDLDIRECT 84.0 10/05/2020   TRIG 56.0 10/07/2021   CHOLHDL 2 10/07/2021   Lab Results  Component Value  Date   VD25OH 62.5 04/20/2021   VD25OH 27.9 (L) 12/08/2020   VD25OH 24.36 (L) 02/11/2019   Lab Results  Component Value Date   WBC 6.1 10/10/2021   HGB 12.8 (L) 10/10/2021   HCT 37.1 (L) 10/10/2021   MCV 86.4 10/10/2021   PLT 210.0 10/10/2021   Lab Results  Component Value Date   IRON 63 10/10/2021   Attestation Statements:   Reviewed by clinician on day of visit: allergies, medications, problem list, medical history, surgical history, family history, social history, and previous encounter notes.  I, Lizbeth Bark, RMA, am acting as Location manager for [employer], DO.  I have reviewed the above documentation for accuracy and completeness, and I agree with the above. Jearld Lesch, DO

## 2021-11-08 ENCOUNTER — Encounter (INDEPENDENT_AMBULATORY_CARE_PROVIDER_SITE_OTHER): Payer: Self-pay | Admitting: Bariatrics

## 2021-11-23 ENCOUNTER — Encounter: Payer: Self-pay | Admitting: Family Medicine

## 2021-11-25 ENCOUNTER — Other Ambulatory Visit: Payer: Self-pay | Admitting: Family Medicine

## 2021-11-28 ENCOUNTER — Encounter: Payer: Self-pay | Admitting: Family Medicine

## 2021-11-28 ENCOUNTER — Other Ambulatory Visit: Payer: Self-pay | Admitting: Family Medicine

## 2021-11-28 MED ORDER — TADALAFIL 20 MG PO TABS: 10.0000 mg | ORAL_TABLET | ORAL | 3 refills | Status: DC | PRN

## 2021-11-29 ENCOUNTER — Ambulatory Visit (INDEPENDENT_AMBULATORY_CARE_PROVIDER_SITE_OTHER): Payer: BC Managed Care – PPO | Admitting: Bariatrics

## 2021-11-29 ENCOUNTER — Encounter (INDEPENDENT_AMBULATORY_CARE_PROVIDER_SITE_OTHER): Payer: Self-pay | Admitting: Bariatrics

## 2021-11-29 VITALS — BP 113/59 | HR 68 | Temp 97.4°F | Ht 73.0 in | Wt 222.0 lb

## 2021-11-29 DIAGNOSIS — E669 Obesity, unspecified: Secondary | ICD-10-CM | POA: Diagnosis not present

## 2021-11-29 DIAGNOSIS — I10 Essential (primary) hypertension: Secondary | ICD-10-CM | POA: Diagnosis not present

## 2021-11-29 DIAGNOSIS — E782 Mixed hyperlipidemia: Secondary | ICD-10-CM

## 2021-11-29 DIAGNOSIS — Z6829 Body mass index (BMI) 29.0-29.9, adult: Secondary | ICD-10-CM

## 2021-11-30 NOTE — Progress Notes (Signed)
Chief Complaint:   OBESITY Javier King is here to discuss his progress with his obesity treatment plan along with follow-up of his obesity related diagnoses. Javier King is on the Category 1 Plan and states he is following his eating plan approximately 100% of the time. Javier King states he is walking 8,000 steps 7 times per week.  Today's visit was #: 15 Starting weight: 300 lbs Starting date: 12/08/2020 Today's weight: 222 lbs Today's date: 11/29/2021 Total lbs lost to date: 78 Total lbs lost since last in-office visit: 4  Interim History: Javier King is down another 4 pounds since his last visit.  Subjective:   1. Essential hypertension Javier King's blood pressure is currently controlled, and he denies lightheadedness.  2. Mixed hyperlipidemia Javier King is not on medications currently.  Assessment/Plan:   1. Essential hypertension Javier King will continue his medications, and he is to check his blood pressure at home.  We will follow-up at his next visit.  2. Mixed hyperlipidemia Javier King will eliminate his trans fats, and he will eat healthy foods.  3. Obesity, current BMI 29.4 Javier King is currently in the action stage of change. As such, his goal is to continue with weight loss efforts. He has agreed to the Category 1 Plan.   Meal planning was discussed.  Javier King will adhere closely to the plan.  Exercise goals: Increase steps to 10,000/day.  Behavioral modification strategies: increasing lean protein intake, decreasing simple carbohydrates, increasing vegetables, increasing water intake, decreasing eating out, no skipping meals, meal planning and cooking strategies, keeping healthy foods in the home, and planning for success.  Javier King has agreed to follow-up with our clinic in 4 weeks. He was informed of the importance of frequent follow-up visits to maximize his success with intensive lifestyle modifications for his multiple health conditions.   Objective:   Blood pressure (!) 113/59,  pulse 68, temperature (!) 97.4 F (36.3 C), height '6\' 1"'$  (1.854 m), weight 222 lb (100.7 kg), SpO2 100 %. Body mass index is 29.29 kg/m.  General: Cooperative, alert, well developed, in no acute distress. HEENT: Conjunctivae and lids unremarkable. Cardiovascular: Regular rhythm.  Lungs: Normal work of breathing. Neurologic: No focal deficits.   Lab Results  Component Value Date   CREATININE 1.04 10/10/2021   BUN 14 10/10/2021   NA 125 (L) 10/10/2021   K 4.7 10/10/2021   CL 89 (L) 10/10/2021   CO2 30 10/10/2021   Lab Results  Component Value Date   ALT 25 10/07/2021   AST 25 10/07/2021   ALKPHOS 58 10/07/2021   BILITOT 1.2 10/07/2021   Lab Results  Component Value Date   HGBA1C 5.4 04/20/2021   HGBA1C 5.5 12/08/2020   Lab Results  Component Value Date   INSULIN 5.4 04/20/2021   INSULIN 6.1 12/08/2020   Lab Results  Component Value Date   TSH 2.370 12/08/2020   Lab Results  Component Value Date   CHOL 96 10/07/2021   HDL 40.70 10/07/2021   LDLCALC 44 10/07/2021   LDLDIRECT 84.0 10/05/2020   TRIG 56.0 10/07/2021   CHOLHDL 2 10/07/2021   Lab Results  Component Value Date   VD25OH 62.5 04/20/2021   VD25OH 27.9 (L) 12/08/2020   VD25OH 24.36 (L) 02/11/2019   Lab Results  Component Value Date   WBC 6.1 10/10/2021   HGB 12.8 (L) 10/10/2021   HCT 37.1 (L) 10/10/2021   MCV 86.4 10/10/2021   PLT 210.0 10/10/2021   Lab Results  Component Value Date   IRON 63 10/10/2021  Attestation Statements:   Reviewed by clinician on day of visit: allergies, medications, problem list, medical history, surgical history, family history, social history, and previous encounter notes.   Wilhemena Durie, am acting as Location manager for [employer], DO.  I have reviewed the above documentation for accuracy and completeness, and I agree with the above. Jearld Lesch, DO

## 2021-12-01 ENCOUNTER — Other Ambulatory Visit: Payer: Self-pay | Admitting: Family Medicine

## 2021-12-09 ENCOUNTER — Ambulatory Visit: Payer: BC Managed Care – PPO | Admitting: Family Medicine

## 2021-12-09 ENCOUNTER — Encounter: Payer: Self-pay | Admitting: Family Medicine

## 2021-12-09 VITALS — BP 120/64 | HR 75 | Temp 97.8°F | Resp 16 | Ht 73.0 in | Wt 227.0 lb

## 2021-12-09 DIAGNOSIS — I10 Essential (primary) hypertension: Secondary | ICD-10-CM

## 2021-12-09 DIAGNOSIS — T887XXA Unspecified adverse effect of drug or medicament, initial encounter: Secondary | ICD-10-CM | POA: Diagnosis not present

## 2021-12-09 NOTE — Progress Notes (Signed)
Chief Complaint  Patient presents with   Back Pain    Here for back pain    Subjective: Patient is a 66 y.o. male here for medication issues.  Patient was prescribed Cialis 20 mg daily as needed for erectile dysfunction.  It works quite well for the erections but gave him back pain the following day.  He took some Flexeril and stretch down and things improved.  He is wondering if this is a side effect from the Cialis.  He also notes delayed ejaculation and is wondering if that is contributing as well.  He has a history of high blood pressure and monitors his numbers at home.  They have been running in the 90s-110s/60s-70s.  He is compliant with the medication lisinopril 40 mg daily and metoprolol 25 mg twice daily.  He is not having any side effects.  Diet is extremely healthy and he continues to lose weight.  He is walking and cycling.  No chest pain or shortness of breath.  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   Cancer ([place]) [id number]   skin cancer   DDD (degenerative disc disease), lumbar    Depression 01/14/2016   Edema of both lower extremities    Erectile dysfunction    Essential hypertension 01/14/2016   Hepatic steatosis    History of TIA (transient ischemic attack)    per pt/ unsure if had TIA    Hyperlipidemia    Lower back pain    Pericarditis 1986   Tubular adenoma of colon    02/17/10    Objective: BP 120/64 (BP Location: Right Arm, Patient Position: Sitting, Cuff Size: Normal)   Pulse 75   Temp 97.8 F (36.6 C) (Oral)   Resp 16   Ht '6\' 1"'$  (1.854 m)   Wt 227 lb (103 kg)   SpO2 98%   BMI 29.95 kg/m  General: Awake, appears stated age Heart: RRR, no LE edema Lungs: CTAB, no rales, wheezes or rhonchi. No accessory muscle use Psych: Age appropriate judgment and insight, normal affect and mood  Assessment and Plan: Side effect of drug  Essential  hypertension  Adverse effect of medication.  We will cut the Cialis in half and see if that has the same effect without causing any side effects.  If he is still having trouble with delayed ejaculation, he will cut his trazodone in half to 50 mg nightly. Chronic issue, probably overmedicated.  Continue lisinopril 40 mg daily.  Stop metoprolol.  Continue to monitor blood pressure at home.  If it stays below 659 systolic, he will let me know and we will start to wean down his lisinopril.  He is doing an excellent job losing weight.  Counseled on diet and exercise. The patient voiced understanding and agreement to the plan.  [place], DO 12/09/21  4:35 PM

## 2021-12-09 NOTE — Patient Instructions (Signed)
Try half a tab of the Cialis for the next few doses. If it works, Engineer, manufacturing.  If not, take 1/2 tab of the trazodone. I will be in talks with the pharmacy team as well.   We are done with metoprolol.  Very strong work with your medication.  Let us know if you need anything.

## 2021-12-28 ENCOUNTER — Encounter (INDEPENDENT_AMBULATORY_CARE_PROVIDER_SITE_OTHER): Payer: Self-pay

## 2022-01-02 ENCOUNTER — Other Ambulatory Visit: Payer: Self-pay | Admitting: Family Medicine

## 2022-01-02 ENCOUNTER — Ambulatory Visit (INDEPENDENT_AMBULATORY_CARE_PROVIDER_SITE_OTHER): Payer: BC Managed Care – PPO | Admitting: Bariatrics

## 2022-01-02 ENCOUNTER — Encounter (INDEPENDENT_AMBULATORY_CARE_PROVIDER_SITE_OTHER): Payer: Self-pay | Admitting: Bariatrics

## 2022-01-02 VITALS — BP 138/77 | HR 99 | Temp 97.6°F

## 2022-01-02 DIAGNOSIS — R748 Abnormal levels of other serum enzymes: Secondary | ICD-10-CM

## 2022-01-02 DIAGNOSIS — G4700 Insomnia, unspecified: Secondary | ICD-10-CM

## 2022-01-02 DIAGNOSIS — E669 Obesity, unspecified: Secondary | ICD-10-CM | POA: Diagnosis not present

## 2022-01-02 DIAGNOSIS — Z6828 Body mass index (BMI) 28.0-28.9, adult: Secondary | ICD-10-CM

## 2022-01-02 DIAGNOSIS — E782 Mixed hyperlipidemia: Secondary | ICD-10-CM

## 2022-01-02 NOTE — Telephone Encounter (Signed)
Last OV--12/09/2021 Last RF--07/14/21--#45 with 1 refill

## 2022-01-05 NOTE — Progress Notes (Signed)
Chief Complaint:   OBESITY Javier King is here to discuss his progress with his obesity treatment plan along with follow-up of his obesity related diagnoses. Javier King is on the Category 1 Plan and states he is following his eating plan approximately 90% of the time. Javier King states he is walking 7000 steps 7 days/week.  Today's visit was #: 67 Starting weight: 300 lbs Starting date: 12/08/2020 Today's weight: 214 lbs Today's date: 01/02/22 Total lbs lost to date: 86 lbs Total lbs lost since last in-office visit: -8  Interim History: He is down 8 pounds since his last visit.  He will be doing adequate water and protein.  Subjective:   1. Mixed hyperlipidemia No medications.  2. Elevated liver enzymes No abdominal pain.  Assessment/Plan:   1. Mixed hyperlipidemia 1.  Increase exercise. 2.  Increase MUFAs and PUFAs.   2. Elevated liver enzymes Will follow over time.  3. Obesity, current BMI 28.3 1.  Meal planning 2.  Intentional eating.  Javier King is currently in the action stage of change. As such, his goal is to continue with weight loss efforts. He has agreed to the Category 1 Plan.   Exercise goals: Increase exercise.  Currently doing 7000 steps daily.  Behavioral modification strategies: increasing lean protein intake, decreasing simple carbohydrates, increasing vegetables, increasing water intake, decreasing eating out, no skipping meals, meal planning and cooking strategies, keeping healthy foods in the home, and planning for success.  Javier King has agreed to follow-up with our clinic in 2-3 weeks. He was informed of the importance of frequent follow-up visits to maximize his success with intensive lifestyle modifications for his multiple health conditions.   Objective:   Blood pressure 138/77, pulse 99, temperature 97.6 F (36.4 C), SpO2 99 %. There is no height or weight on file to calculate BMI.  General: Cooperative, alert, well developed, in no acute  distress. HEENT: Conjunctivae and lids unremarkable. Cardiovascular: Regular rhythm.  Lungs: Normal work of breathing. Neurologic: No focal deficits.   Lab Results  Component Value Date   CREATININE 1.04 10/10/2021   BUN 14 10/10/2021   NA 125 (L) 10/10/2021   K 4.7 10/10/2021   CL 89 (L) 10/10/2021   CO2 30 10/10/2021   Lab Results  Component Value Date   ALT 25 10/07/2021   AST 25 10/07/2021   ALKPHOS 58 10/07/2021   BILITOT 1.2 10/07/2021   Lab Results  Component Value Date   HGBA1C 5.4 04/20/2021   HGBA1C 5.5 12/08/2020   Lab Results  Component Value Date   INSULIN 5.4 04/20/2021   INSULIN 6.1 12/08/2020   Lab Results  Component Value Date   TSH 2.370 12/08/2020   Lab Results  Component Value Date   CHOL 96 10/07/2021   HDL 40.70 10/07/2021   LDLCALC 44 10/07/2021   LDLDIRECT 84.0 10/05/2020   TRIG 56.0 10/07/2021   CHOLHDL 2 10/07/2021   Lab Results  Component Value Date   VD25OH 62.5 04/20/2021   VD25OH 27.9 (L) 12/08/2020   VD25OH 24.36 (L) 02/11/2019   Lab Results  Component Value Date   WBC 6.1 10/10/2021   HGB 12.8 (L) 10/10/2021   HCT 37.1 (L) 10/10/2021   MCV 86.4 10/10/2021   PLT 210.0 10/10/2021   Lab Results  Component Value Date   IRON 63 10/10/2021    Attestation Statements:   Reviewed by clinician on day of visit: allergies, medications, problem list, medical history, surgical history, family history, social history, and previous encounter notes.  I, Dawn Whitmire, FNP-C, am acting as transcriptionist for Dr. Jearld Lesch.  I have reviewed the above documentation for accuracy and completeness, and I agree with the above. Jearld Lesch, DO

## 2022-01-09 ENCOUNTER — Encounter (INDEPENDENT_AMBULATORY_CARE_PROVIDER_SITE_OTHER): Payer: Self-pay | Admitting: Bariatrics

## 2022-01-18 ENCOUNTER — Other Ambulatory Visit: Payer: Self-pay | Admitting: Family Medicine

## 2022-01-18 DIAGNOSIS — I10 Essential (primary) hypertension: Secondary | ICD-10-CM

## 2022-02-01 ENCOUNTER — Ambulatory Visit: Payer: BC Managed Care – PPO | Admitting: Bariatrics

## 2022-02-01 ENCOUNTER — Encounter: Payer: Self-pay | Admitting: Bariatrics

## 2022-02-01 VITALS — BP 107/69 | HR 80 | Temp 97.6°F | Ht 73.0 in | Wt 217.0 lb

## 2022-02-01 DIAGNOSIS — Z6828 Body mass index (BMI) 28.0-28.9, adult: Secondary | ICD-10-CM

## 2022-02-01 DIAGNOSIS — E668 Other obesity: Secondary | ICD-10-CM

## 2022-02-01 DIAGNOSIS — E782 Mixed hyperlipidemia: Secondary | ICD-10-CM | POA: Diagnosis not present

## 2022-02-01 DIAGNOSIS — E65 Localized adiposity: Secondary | ICD-10-CM | POA: Insufficient documentation

## 2022-02-08 ENCOUNTER — Encounter: Payer: Self-pay | Admitting: Bariatrics

## 2022-02-08 DIAGNOSIS — L821 Other seborrheic keratosis: Secondary | ICD-10-CM | POA: Diagnosis not present

## 2022-02-08 DIAGNOSIS — D2262 Melanocytic nevi of left upper limb, including shoulder: Secondary | ICD-10-CM | POA: Diagnosis not present

## 2022-02-08 DIAGNOSIS — D225 Melanocytic nevi of trunk: Secondary | ICD-10-CM | POA: Diagnosis not present

## 2022-02-08 DIAGNOSIS — D2261 Melanocytic nevi of right upper limb, including shoulder: Secondary | ICD-10-CM | POA: Diagnosis not present

## 2022-02-08 DIAGNOSIS — L570 Actinic keratosis: Secondary | ICD-10-CM | POA: Diagnosis not present

## 2022-02-08 DIAGNOSIS — C44319 Basal cell carcinoma of skin of other parts of face: Secondary | ICD-10-CM | POA: Diagnosis not present

## 2022-02-08 DIAGNOSIS — D485 Neoplasm of uncertain behavior of skin: Secondary | ICD-10-CM | POA: Diagnosis not present

## 2022-02-08 NOTE — Progress Notes (Signed)
Chief Complaint:   OBESITY Javier King is here to discuss his progress with his obesity treatment plan along with follow-up of his obesity related diagnoses. Javier King is on the Category 1 Plan and states he is following his eating plan approximately 50% of the time. Javier King states he is walking 7,000 steps daily 7 times per week.   Today's visit was #: 12 Starting weight: 300 lbs Starting date: 12/08/2020 Today's weight: 217 lbs Today's date: 02/01/2022 Total lbs lost to date: 83 Total lbs lost since last in-office visit: 0  Interim History: Javier King is up 3 lbs since his last visit, but he has done well overall. He has had more celebrations and eating out.   Subjective:   1. Mixed hyperlipidemia Javier King is not on medications currently.   2. Visceral obesity Javier King's visceral fat range is 15, and ideal is below 13.  Assessment/Plan:   1. Mixed hyperlipidemia Javier King is to eliminate trans fats, and he will read labels.   2. Visceral obesity Javier King will continue his exercise, increase steps ultimately to 10,000 steps. He will continue his weight loss.   3. Obesity, current BMI 28.7 Javier King is currently in the action stage of change. As such, his goal is to continue with weight loss efforts. He has agreed to the Category 1 Plan.   Meal planning and intentional eating were discussed. He will get back on track.   Exercise goals: As is, and upper body workouts.   Behavioral modification strategies: increasing lean protein intake, decreasing simple carbohydrates, increasing vegetables, increasing water intake, decreasing eating out, no skipping meals, meal planning and cooking strategies, keeping healthy foods in the home, and planning for success.  Javier King has agreed to follow-up with our clinic in 4 weeks. He was informed of the importance of frequent follow-up visits to maximize his success with intensive lifestyle modifications for his multiple health conditions.   Objective:    Blood pressure 107/69, pulse 80, temperature 97.6 F (36.4 C), height '6\' 1"'$  (1.854 m), weight 217 lb (98.4 kg), SpO2 100 %. Body mass index is 28.63 kg/m.  General: Cooperative, alert, well developed, in no acute distress. HEENT: Conjunctivae and lids unremarkable. Cardiovascular: Regular rhythm.  Lungs: Normal work of breathing. Neurologic: No focal deficits.   Lab Results  Component Value Date   CREATININE 1.04 10/10/2021   BUN 14 10/10/2021   NA 125 (L) 10/10/2021   K 4.7 10/10/2021   CL 89 (L) 10/10/2021   CO2 30 10/10/2021   Lab Results  Component Value Date   ALT 25 10/07/2021   AST 25 10/07/2021   ALKPHOS 58 10/07/2021   BILITOT 1.2 10/07/2021   Lab Results  Component Value Date   HGBA1C 5.4 04/20/2021   HGBA1C 5.5 12/08/2020   Lab Results  Component Value Date   INSULIN 5.4 04/20/2021   INSULIN 6.1 12/08/2020   Lab Results  Component Value Date   TSH 2.370 12/08/2020   Lab Results  Component Value Date   CHOL 96 10/07/2021   HDL 40.70 10/07/2021   LDLCALC 44 10/07/2021   LDLDIRECT 84.0 10/05/2020   TRIG 56.0 10/07/2021   CHOLHDL 2 10/07/2021   Lab Results  Component Value Date   VD25OH 62.5 04/20/2021   VD25OH 27.9 (L) 12/08/2020   VD25OH 24.36 (L) 02/11/2019   Lab Results  Component Value Date   WBC 6.1 10/10/2021   HGB 12.8 (L) 10/10/2021   HCT 37.1 (L) 10/10/2021   MCV 86.4 10/10/2021   PLT  210.0 10/10/2021   Lab Results  Component Value Date   IRON 63 10/10/2021   Attestation Statements:   Reviewed by clinician on day of visit: allergies, medications, problem list, medical history, surgical history, family history, social history, and previous encounter notes.   Wilhemena Durie, am acting as Location manager for [employer], DO.  I have reviewed the above documentation for accuracy and completeness, and I agree with the above. Jearld Lesch, DO

## 2022-02-23 ENCOUNTER — Ambulatory Visit (INDEPENDENT_AMBULATORY_CARE_PROVIDER_SITE_OTHER): Payer: BC Managed Care – PPO

## 2022-02-23 DIAGNOSIS — Z23 Encounter for immunization: Secondary | ICD-10-CM

## 2022-03-02 ENCOUNTER — Encounter: Payer: Self-pay | Admitting: Bariatrics

## 2022-03-02 ENCOUNTER — Ambulatory Visit: Payer: BC Managed Care – PPO | Admitting: Bariatrics

## 2022-03-02 VITALS — BP 127/73 | HR 67 | Temp 98.0°F | Ht 73.0 in | Wt 211.0 lb

## 2022-03-02 DIAGNOSIS — Z6827 Body mass index (BMI) 27.0-27.9, adult: Secondary | ICD-10-CM

## 2022-03-02 DIAGNOSIS — I10 Essential (primary) hypertension: Secondary | ICD-10-CM | POA: Diagnosis not present

## 2022-03-02 DIAGNOSIS — E669 Obesity, unspecified: Secondary | ICD-10-CM | POA: Diagnosis not present

## 2022-03-02 DIAGNOSIS — E66813 Obesity, class 3: Secondary | ICD-10-CM

## 2022-03-02 DIAGNOSIS — E782 Mixed hyperlipidemia: Secondary | ICD-10-CM

## 2022-03-03 ENCOUNTER — Other Ambulatory Visit (HOSPITAL_BASED_OUTPATIENT_CLINIC_OR_DEPARTMENT_OTHER): Payer: Self-pay

## 2022-03-03 MED ORDER — COMIRNATY 30 MCG/0.3ML IM SUSP
INTRAMUSCULAR | 0 refills | Status: DC
  Filled 2022-03-03: qty 0.3, 1d supply, fill #0

## 2022-03-08 NOTE — Progress Notes (Unsigned)
Chief Complaint:   OBESITY Javier King is here to discuss his progress with his obesity treatment plan along with follow-up of his obesity related diagnoses. Javier King is on the Category 1 Plan and states he is following his eating plan approximately 95% of the time. Javier King states he is walking 7,000 steps 7 times per week.    Today's visit was #: 18 Starting weight: 300 lbs Starting date: 12/08/2020 Today's weight: 211 lbs Today's date: 03/02/2022 Total lbs lost to date: 89 Total lbs lost since last in-office visit: 6  Interim History: Javier King is down an additional 6 lbs, and he has sone exceptionally well. His target weight is around 200 lbs.   Subjective:   1. Mixed hyperlipidemia Javier King is not on medications.   2. Essential hypertension Javier King's blood pressure is well controlled.   Assessment/Plan:   1. Mixed hyperlipidemia Javier King will continue to eliminate trans fats, and limit saturated fats.   2. Essential hypertension Javier King will continue his meal plan and exercise, and he is to eliminate added salt.   3. Obesity, current BMI 27.9 Javier King is currently in the action stage of change. As such, his goal is to continue with weight loss efforts. He has agreed to the Category 1 Plan.   He will adhere to the plan 80-90%.  His goal weight is around 200 pounds. We will recheck fasting labs at his next visit.  Exercise goals: As is.  Behavioral modification strategies: increasing lean protein intake, decreasing simple carbohydrates, increasing vegetables, increasing water intake, decreasing eating out, no skipping meals, meal planning and cooking strategies, and keeping healthy foods in the home.  Javier King has agreed to follow-up with our clinic in 4 weeks. He was informed of the importance of frequent follow-up visits to maximize his success with intensive lifestyle modifications for his multiple health conditions.   Objective:   Blood pressure 127/73, pulse 67,  temperature 98 F (36.7 C), height '6\' 1"'$  (1.854 m), weight 211 lb (95.7 kg), SpO2 100 %. Body mass index is 27.84 kg/m.  General: Cooperative, alert, well developed, in no acute distress. HEENT: Conjunctivae and lids unremarkable. Cardiovascular: Regular rhythm.  Lungs: Normal work of breathing. Neurologic: No focal deficits.   Lab Results  Component Value Date   CREATININE 1.04 10/10/2021   BUN 14 10/10/2021   NA 125 (L) 10/10/2021   K 4.7 10/10/2021   CL 89 (L) 10/10/2021   CO2 30 10/10/2021   Lab Results  Component Value Date   ALT 25 10/07/2021   AST 25 10/07/2021   ALKPHOS 58 10/07/2021   BILITOT 1.2 10/07/2021   Lab Results  Component Value Date   HGBA1C 5.4 04/20/2021   HGBA1C 5.5 12/08/2020   Lab Results  Component Value Date   INSULIN 5.4 04/20/2021   INSULIN 6.1 12/08/2020   Lab Results  Component Value Date   TSH 2.370 12/08/2020   Lab Results  Component Value Date   CHOL 96 10/07/2021   HDL 40.70 10/07/2021   LDLCALC 44 10/07/2021   LDLDIRECT 84.0 10/05/2020   TRIG 56.0 10/07/2021   CHOLHDL 2 10/07/2021   Lab Results  Component Value Date   VD25OH 62.5 04/20/2021   VD25OH 27.9 (L) 12/08/2020   VD25OH 24.36 (L) 02/11/2019   Lab Results  Component Value Date   WBC 6.1 10/10/2021   HGB 12.8 (L) 10/10/2021   HCT 37.1 (L) 10/10/2021   MCV 86.4 10/10/2021   PLT 210.0 10/10/2021   Lab Results  Component  Value Date   IRON 63 10/10/2021   Attestation Statements:   Reviewed by clinician on day of visit: allergies, medications, problem list, medical history, surgical history, family history, social history, and previous encounter notes.   Wilhemena Durie, am acting as Location manager for [employer], DO.  I have reviewed the above documentation for accuracy and completeness, and I agree with the above. Jearld Lesch, DO

## 2022-03-09 ENCOUNTER — Encounter: Payer: Self-pay | Admitting: Bariatrics

## 2022-03-16 DIAGNOSIS — D2339 Other benign neoplasm of skin of other parts of face: Secondary | ICD-10-CM | POA: Diagnosis not present

## 2022-03-16 DIAGNOSIS — D485 Neoplasm of uncertain behavior of skin: Secondary | ICD-10-CM | POA: Diagnosis not present

## 2022-03-16 DIAGNOSIS — C44319 Basal cell carcinoma of skin of other parts of face: Secondary | ICD-10-CM | POA: Diagnosis not present

## 2022-03-21 ENCOUNTER — Ambulatory Visit: Payer: BC Managed Care – PPO | Admitting: Internal Medicine

## 2022-03-22 ENCOUNTER — Other Ambulatory Visit: Payer: Self-pay | Admitting: Family Medicine

## 2022-03-22 ENCOUNTER — Telehealth: Payer: Self-pay | Admitting: Family Medicine

## 2022-03-22 DIAGNOSIS — G4700 Insomnia, unspecified: Secondary | ICD-10-CM

## 2022-03-22 MED ORDER — ZOLPIDEM TARTRATE 5 MG PO TABS: ORAL_TABLET | ORAL | 1 refills | Status: DC

## 2022-03-22 NOTE — Telephone Encounter (Signed)
Last OV---12/09/2021 Last RF-----01/02/2022---#45 with 1 refill

## 2022-03-22 NOTE — Telephone Encounter (Signed)
Medication: zolpidem (AMBIEN) 5 MG tablet  Has the patient contacted their pharmacy? Yes.     Preferred Pharmacy:   [facility], [place] to [address], [place] [address]

## 2022-03-23 DIAGNOSIS — L905 Scar conditions and fibrosis of skin: Secondary | ICD-10-CM | POA: Diagnosis not present

## 2022-03-30 ENCOUNTER — Encounter: Payer: Self-pay | Admitting: Bariatrics

## 2022-03-30 ENCOUNTER — Ambulatory Visit: Payer: BC Managed Care – PPO | Admitting: Bariatrics

## 2022-03-30 VITALS — BP 111/72 | HR 64 | Temp 97.8°F | Ht 73.0 in | Wt 207.0 lb

## 2022-03-30 DIAGNOSIS — E782 Mixed hyperlipidemia: Secondary | ICD-10-CM | POA: Diagnosis not present

## 2022-03-30 DIAGNOSIS — E559 Vitamin D deficiency, unspecified: Secondary | ICD-10-CM | POA: Diagnosis not present

## 2022-03-30 DIAGNOSIS — D649 Anemia, unspecified: Secondary | ICD-10-CM

## 2022-03-30 DIAGNOSIS — R748 Abnormal levels of other serum enzymes: Secondary | ICD-10-CM | POA: Diagnosis not present

## 2022-03-30 DIAGNOSIS — I10 Essential (primary) hypertension: Secondary | ICD-10-CM | POA: Diagnosis not present

## 2022-03-30 DIAGNOSIS — Z6827 Body mass index (BMI) 27.0-27.9, adult: Secondary | ICD-10-CM

## 2022-03-30 DIAGNOSIS — Z6841 Body Mass Index (BMI) 40.0 and over, adult: Secondary | ICD-10-CM

## 2022-03-30 DIAGNOSIS — E669 Obesity, unspecified: Secondary | ICD-10-CM

## 2022-03-31 LAB — COMPREHENSIVE METABOLIC PANEL
ALT: 34 IU/L (ref 0–44)
AST: 33 IU/L (ref 0–40)
Albumin/Globulin Ratio: 2 (ref 1.2–2.2)
Albumin: 4.5 g/dL (ref 3.9–4.9)
Alkaline Phosphatase: 63 IU/L (ref 44–121)
BUN/Creatinine Ratio: 24 (ref 10–24)
BUN: 26 mg/dL (ref 8–27)
Bilirubin Total: 0.9 mg/dL (ref 0.0–1.2)
CO2: 26 mmol/L (ref 20–29)
Calcium: 10.1 mg/dL (ref 8.6–10.2)
Chloride: 93 mmol/L — ABNORMAL LOW (ref 96–106)
Creatinine, Ser: 1.07 mg/dL (ref 0.76–1.27)
Globulin, Total: 2.3 g/dL (ref 1.5–4.5)
Glucose: 84 mg/dL (ref 70–99)
Potassium: 5 mmol/L (ref 3.5–5.2)
Sodium: 134 mmol/L (ref 134–144)
Total Protein: 6.8 g/dL (ref 6.0–8.5)
eGFR: 77 mL/min/{1.73_m2} (ref >59)

## 2022-03-31 LAB — CBC WITH DIFFERENTIAL/PLATELET
Basophils Absolute: 0.1 10*3/uL (ref 0.0–0.2)
Basos: 1 %
EOS (ABSOLUTE): 0 10*3/uL (ref 0.0–0.4)
Eos: 0 %
Hematocrit: 41.8 % (ref 37.5–51.0)
Hemoglobin: 13.6 g/dL (ref 13.0–17.7)
Immature Grans (Abs): 0 10*3/uL (ref 0.0–0.1)
Immature Granulocytes: 1 %
Lymphocytes Absolute: 1.4 10*3/uL (ref 0.7–3.1)
Lymphs: 21 %
MCH: 29.4 pg (ref 26.6–33.0)
MCHC: 32.5 g/dL (ref 31.5–35.7)
MCV: 90 fL (ref 79–97)
Monocytes Absolute: 0.5 10*3/uL (ref 0.1–0.9)
Monocytes: 8 %
Neutrophils Absolute: 4.5 10*3/uL (ref 1.4–7.0)
Neutrophils: 69 %
Platelets: 236 10*3/uL (ref 150–450)
RBC: 4.63 x10E6/uL (ref 4.14–5.80)
RDW: 12.4 % (ref 11.6–15.4)
WBC: 6.5 10*3/uL (ref 3.4–10.8)

## 2022-03-31 LAB — LIPID PANEL WITH LDL/HDL RATIO
Cholesterol, Total: 149 mg/dL (ref 100–199)
HDL: 44 mg/dL (ref >39)
LDL Chol Calc (NIH): 90 mg/dL (ref 0–99)
LDL/HDL Ratio: 2 ratio (ref 0.0–3.6)
Triglycerides: 75 mg/dL (ref 0–149)
VLDL Cholesterol Cal: 15 mg/dL (ref 5–40)

## 2022-03-31 LAB — VITAMIN D 25 HYDROXY (VIT D DEFICIENCY, FRACTURES): Vit D, 25-Hydroxy: 65.4 ng/mL (ref 30.0–100.0)

## 2022-03-31 LAB — FERRITIN: Ferritin: 225 ng/mL (ref 30–400)

## 2022-04-03 ENCOUNTER — Other Ambulatory Visit: Payer: Self-pay | Admitting: Family Medicine

## 2022-04-03 DIAGNOSIS — G4700 Insomnia, unspecified: Secondary | ICD-10-CM

## 2022-04-06 ENCOUNTER — Encounter: Payer: Self-pay | Admitting: Internal Medicine

## 2022-04-12 ENCOUNTER — Encounter: Payer: Self-pay | Admitting: Bariatrics

## 2022-04-12 NOTE — Progress Notes (Signed)
Chief Complaint:   OBESITY Javier King is here to discuss his progress with his obesity treatment plan along with follow-up of his obesity related diagnoses. Javier King is on the Category 1 Plan and states he is following his eating plan approximately 80% of the time. Javier King states he is walking 5,000 steps 7 times per week.    Today's visit was #: 41 Starting weight: 300 lbs Starting date: 12/08/2020 Today's weight: 207 lbs Today's date: 03/30/2022 Total lbs lost to date: 93 Total lbs lost since last in-office visit: 4  Interim History: Javier King is down 4 pounds since his last visit, and he is doing well overall.  His goal weight is 200 pounds.  Subjective:   1. Mixed hyperlipidemia Javier King is not on medications currently.  2. Essential hypertension Javier King is taking lisinopril, and his blood pressure is controlled.  3. Low hemoglobin Javier King is not on medications currently.  4. Elevated liver enzymes Javier King is not on medications currently.  5. Vitamin D deficiency Javier King is taking vitamin D supplementation.  Assessment/Plan:   1. Mixed hyperlipidemia We will check labs today.  Handout on healthy fats versus unhealthy fats was given.  We discussed fats making him more full.  - Lipid Panel With LDL/HDL Ratio  2. Essential hypertension Javier King will continue his medications as directed, and we will follow-up at his next visit.  3. Low hemoglobin We will check labs today, and we will follow-up at Javier King's next visit.  - CBC with Differential/Platelet - Ferritin  4. Elevated liver enzymes We will check labs today, and we will follow-up at Javier King's next visit.  - Comprehensive metabolic panel  5. Vitamin D deficiency We will check labs today.  Javier King will continue his vitamin D supplementation.  - VITAMIN D 25 Hydroxy (Vit-D Deficiency, Fractures)  6. Obesity, current BMI 27.4 Javier King is currently in the action stage of change. As such, his goal is to  continue with weight loss efforts. He has agreed to the Category 1 Plan.   Javier King will adhere closely to the plan.  Healthy snacking was discussed.  Exercise goals: As is.   Behavioral modification strategies: increasing lean protein intake, decreasing simple carbohydrates, increasing vegetables, increasing water intake, decreasing eating out, no skipping meals, meal planning and cooking strategies, keeping healthy foods in the home, and planning for success.  Javier King has agreed to follow-up with our clinic in 4 weeks. He was informed of the importance of frequent follow-up visits to maximize his success with intensive lifestyle modifications for his multiple health conditions.   Knowledge was informed we would discuss his lab results at his next visit unless there is a critical issue that needs to be addressed sooner. Javier King agreed to keep his next visit at the agreed upon time to discuss these results.  Objective:   Blood pressure 111/72, pulse 64, temperature 97.8 F (36.6 C), height '6\' 1"'$  (1.854 m), weight 207 lb (93.9 kg), SpO2 100 %. Body mass index is 27.31 kg/m.  General: Cooperative, alert, well developed, in no acute distress. HEENT: Conjunctivae and lids unremarkable. Cardiovascular: Regular rhythm.  Lungs: Normal work of breathing. Neurologic: No focal deficits.   Lab Results  Component Value Date   CREATININE 1.07 03/30/2022   BUN 26 03/30/2022   NA 134 03/30/2022   K 5.0 03/30/2022   CL 93 (L) 03/30/2022   CO2 26 03/30/2022   Lab Results  Component Value Date   ALT 34 03/30/2022   AST 33 03/30/2022   ALKPHOS  63 03/30/2022   BILITOT 0.9 03/30/2022   Lab Results  Component Value Date   HGBA1C 5.4 04/20/2021   HGBA1C 5.5 12/08/2020   Lab Results  Component Value Date   INSULIN 5.4 04/20/2021   INSULIN 6.1 12/08/2020   Lab Results  Component Value Date   TSH 2.370 12/08/2020   Lab Results  Component Value Date   CHOL 149 03/30/2022   HDL 44  03/30/2022   LDLCALC 90 03/30/2022   LDLDIRECT 84.0 10/05/2020   TRIG 75 03/30/2022   CHOLHDL 2 10/07/2021   Lab Results  Component Value Date   VD25OH 65.4 03/30/2022   VD25OH 62.5 04/20/2021   VD25OH 27.9 (L) 12/08/2020   Lab Results  Component Value Date   WBC 6.5 03/30/2022   HGB 13.6 03/30/2022   HCT 41.8 03/30/2022   MCV 90 03/30/2022   PLT 236 03/30/2022   Lab Results  Component Value Date   IRON 63 10/10/2021   FERRITIN 225 03/30/2022   Attestation Statements:   Reviewed by clinician on day of visit: allergies, medications, problem list, medical history, surgical history, family history, social history, and previous encounter notes.   Wilhemena Durie, am acting as Location manager for [employer], DO.  I have reviewed the above documentation for accuracy and completeness, and I agree with the above. Jearld Lesch, DO

## 2022-04-21 ENCOUNTER — Encounter: Payer: Self-pay | Admitting: Family Medicine

## 2022-04-21 ENCOUNTER — Ambulatory Visit: Payer: BC Managed Care – PPO | Admitting: Family Medicine

## 2022-04-21 DIAGNOSIS — G4700 Insomnia, unspecified: Secondary | ICD-10-CM | POA: Diagnosis not present

## 2022-04-21 DIAGNOSIS — I10 Essential (primary) hypertension: Secondary | ICD-10-CM | POA: Diagnosis not present

## 2022-04-21 MED ORDER — ZOLPIDEM TARTRATE 5 MG PO TABS: ORAL_TABLET | ORAL | 1 refills | Status: DC

## 2022-04-21 MED ORDER — LISINOPRIL 40 MG PO TABS: 40.0000 mg | ORAL_TABLET | Freq: Every day | ORAL | 3 refills | Status: DC

## 2022-04-21 NOTE — Patient Instructions (Signed)
Keep the diet clean and stay active.  Very strong work with your weight loss.   Let us know if you need anything.

## 2022-04-21 NOTE — Progress Notes (Signed)
Chief Complaint  Patient presents with   Follow-up    6 month RSV immunization Refill Zolpidem    Subjective Javier King is a 66 y.o. male who presents for hypertension follow up. He does monitor home blood pressures. Blood pressures ranging from 110's/70's on average. He is compliant with medication- lisinopril 20 mg/d. Patient has these side effects of medication: none He is adhering to a healthy diet overall. Current exercise: walking, lifting wts No Cp or SOB.   Insomnia Hx of insomnia currently on Ambien 5 mg qhs prn. No AE's, has been thru therapy and was able to wean down from 10 mg qhs.    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   Cancer ([place]) 8828,[id number]   skin cancer   DDD (degenerative disc disease), lumbar    Depression 01/14/2016   Edema of both lower extremities    Erectile dysfunction    Essential hypertension 01/14/2016   Hepatic steatosis    History of TIA (transient ischemic attack)    per pt/ unsure if had TIA    Hyperlipidemia    Lower back pain    Pericarditis 1986   Tubular adenoma of colon    02/17/10    Exam BP 118/68 (BP Location: Left Arm, Patient Position: Sitting, Cuff Size: Normal)   Pulse 71   Temp 98.1 F (36.7 C) (Oral)   Ht '6\' 1"'$  (1.854 m)   Wt 217 lb 8 oz (98.7 kg)   SpO2 99%   BMI 28.70 kg/m  General:  well developed, well nourished, in no apparent distress Heart: RRR, no bruits, no LE edema Lungs: clear to auscultation, no accessory muscle use Psych: well oriented with normal range of affect and appropriate judgment/insight  Essential hypertension - Plan: lisinopril (ZESTRIL) 40 MG tablet  Insomnia, unspecified type - Plan: zolpidem (AMBIEN) 5 MG tablet  Chronic, stable.  Continue lisinopril 40 mg daily.  Monitor blood pressure at home and let me know if it drops below 917 systolic routinely.  He has done an  excellent job losing weight.  Counseled on diet and exercise. Chronic, stable.  Continue Ambien 5 mg nightly as needed. He has a colonoscopy scheduled next month. F/u in 6 mo for physical or as needed. The patient voiced understanding and agreement to the plan.  [place], DO 04/21/22  4:11 PM

## 2022-04-27 ENCOUNTER — Ambulatory Visit: Payer: BC Managed Care – PPO | Admitting: Bariatrics

## 2022-04-27 ENCOUNTER — Encounter: Payer: Self-pay | Admitting: Bariatrics

## 2022-04-27 VITALS — BP 108/68 | HR 71 | Temp 97.7°F | Ht 73.0 in | Wt 208.0 lb

## 2022-04-27 DIAGNOSIS — Z6827 Body mass index (BMI) 27.0-27.9, adult: Secondary | ICD-10-CM | POA: Diagnosis not present

## 2022-04-27 DIAGNOSIS — E782 Mixed hyperlipidemia: Secondary | ICD-10-CM

## 2022-04-27 DIAGNOSIS — E669 Obesity, unspecified: Secondary | ICD-10-CM | POA: Diagnosis not present

## 2022-04-27 DIAGNOSIS — I10 Essential (primary) hypertension: Secondary | ICD-10-CM | POA: Diagnosis not present

## 2022-04-28 ENCOUNTER — Ambulatory Visit (AMBULATORY_SURGERY_CENTER): Payer: BC Managed Care – PPO | Admitting: *Deleted

## 2022-04-28 ENCOUNTER — Other Ambulatory Visit: Payer: Self-pay

## 2022-04-28 VITALS — Ht 73.0 in | Wt 212.0 lb

## 2022-04-28 DIAGNOSIS — Z86010 Personal history of colonic polyps: Secondary | ICD-10-CM

## 2022-04-28 MED ORDER — NA SULFATE-K SULFATE-MG SULF 17.5-3.13-1.6 GM/177ML PO SOLN: 1.0000 | Freq: Once | ORAL | 0 refills | Status: AC

## 2022-04-28 NOTE — Progress Notes (Signed)
Pre visit completed in person.  No egg or soy allergy known to patient  No issues known to pt with past sedation with any surgeries or procedures Patient denies ever being told they had issues or difficulty with intubation  No FH of Malignant Hyperthermia Pt is not on diet pills Pt is not on  home 02  Pt is not on blood thinners  Pt denies issues with constipation  No A fib or A flutter  Good Rx coupon provided

## 2022-05-10 NOTE — Progress Notes (Signed)
Chief Complaint:   OBESITY Javier King is here to discuss his progress with his obesity treatment plan along with follow-up of his obesity related diagnoses. Javier King is on the Category 1 Plan and states he is following his eating plan approximately 85% of the time. Javier King states he is lifting weights and walking 7,000 steps daily.   Today's visit was #: 20 Starting weight: 300 lbs Starting date: 12/08/2020 Today's weight: 208 lbs Today's date: 04/27/2022 Total lbs lost to date: 92 Total lbs lost since last in-office visit: 0  Interim History: Javier King is up 1 pound over the holiday, but he has done very well overall. He states that he would like to get back on track.   Subjective:   1. Essential hypertension [place] is taking Zestril as directed.   2. Mixed hyperlipidemia [place] is not on medications currently.   Assessment/Plan:   1. Essential hypertension [place] will continue his medications as directed.   2. Mixed hyperlipidemia [place] will work on eliminating trans fats and minimizing saturated fats.   3. Obesity, current BMI 27.5 [place] is currently in the action stage of change. As such, his goal is to continue with weight loss efforts. He has agreed to the Category 1 Plan.   Meal planning was discussed. He will adhere to the plan 85-95%. Minimize carbohydrates. He will get back into the "swing of things". Reviewed labs from 04/20/2021, CMP, Lipids, Vit D, A1c, and insulin.   Exercise goals: As is.   Behavioral modification strategies: increasing lean protein intake, decreasing simple carbohydrates, increasing vegetables, increasing water intake, decreasing eating out, no skipping meals, meal planning and cooking strategies, keeping healthy foods in the home, and planning for success.  Javier King has agreed to follow-up with our clinic in 4 weeks. He was informed of the importance of frequent follow-up visits to maximize his success with intensive lifestyle  modifications for his multiple health conditions.   Objective:   Blood pressure 108/68, pulse 71, temperature 97.7 F (36.5 C), height '6\' 1"'$  (1.854 m), weight 208 lb (94.3 kg), SpO2 100 %. Body mass index is 27.44 kg/m.  General: Cooperative, alert, well developed, in no acute distress. HEENT: Conjunctivae and lids unremarkable. Cardiovascular: Regular rhythm.  Lungs: Normal work of breathing. Neurologic: No focal deficits.   Lab Results  Component Value Date   CREATININE 1.07 03/30/2022   BUN 26 03/30/2022   NA 134 03/30/2022   K 5.0 03/30/2022   CL 93 (L) 03/30/2022   CO2 26 03/30/2022   Lab Results  Component Value Date   ALT 34 03/30/2022   AST 33 03/30/2022   ALKPHOS 63 03/30/2022   BILITOT 0.9 03/30/2022   Lab Results  Component Value Date   HGBA1C 5.4 04/20/2021   HGBA1C 5.5 12/08/2020   Lab Results  Component Value Date   INSULIN 5.4 04/20/2021   INSULIN 6.1 12/08/2020   Lab Results  Component Value Date   TSH 2.370 12/08/2020   Lab Results  Component Value Date   CHOL 149 03/30/2022   HDL 44 03/30/2022   LDLCALC 90 03/30/2022   LDLDIRECT 84.0 10/05/2020   TRIG 75 03/30/2022   CHOLHDL 2 10/07/2021   Lab Results  Component Value Date   VD25OH 65.4 03/30/2022   VD25OH 62.5 04/20/2021   VD25OH 27.9 (L) 12/08/2020   Lab Results  Component Value Date   WBC 6.5 03/30/2022   HGB 13.6 03/30/2022   HCT 41.8 03/30/2022   MCV 90 03/30/2022   PLT  236 03/30/2022   Lab Results  Component Value Date   IRON 63 10/10/2021   FERRITIN 225 03/30/2022   Attestation Statements:   Reviewed by clinician on day of visit: allergies, medications, problem list, medical history, surgical history, family history, social history, and previous encounter notes.   Wilhemena Durie, am acting as Location manager for [employer], DO.  I have reviewed the above documentation for accuracy and completeness, and I agree with the above. Jearld Lesch, DO

## 2022-05-24 ENCOUNTER — Encounter: Payer: Self-pay | Admitting: Internal Medicine

## 2022-05-25 ENCOUNTER — Encounter: Payer: Self-pay | Admitting: Internal Medicine

## 2022-05-25 ENCOUNTER — Ambulatory Visit (AMBULATORY_SURGERY_CENTER): Payer: BC Managed Care – PPO | Admitting: Internal Medicine

## 2022-05-25 VITALS — BP 128/68 | HR 72 | Temp 97.5°F | Resp 13 | Ht 73.0 in | Wt 217.6 lb

## 2022-05-25 DIAGNOSIS — Z09 Encounter for follow-up examination after completed treatment for conditions other than malignant neoplasm: Secondary | ICD-10-CM

## 2022-05-25 DIAGNOSIS — Z86010 Personal history of colonic polyps: Secondary | ICD-10-CM

## 2022-05-25 DIAGNOSIS — Z1211 Encounter for screening for malignant neoplasm of colon: Secondary | ICD-10-CM | POA: Diagnosis not present

## 2022-05-25 MED ORDER — SODIUM CHLORIDE 0.9 % IV SOLN: 500.0000 mL | Freq: Once | INTRAVENOUS | Status: DC

## 2022-05-25 NOTE — Progress Notes (Signed)
Pt resting comfortably. VSS. Airway intact. SBAR complete to RN. All questions answered.   

## 2022-05-25 NOTE — Op Note (Signed)
Javier King Patient Name: Javier King Procedure Date: 05/25/2022 2:48 PM MRN: [id number] Endoscopist: Docia Chuck. Henrene Pastor , MD, [id number] Age: 67 Referring MD:  Date of Birth: 01-Aug-1955 Gender: Male Account #: [id number] Procedure:                Colonoscopy Indications:              High risk colon cancer surveillance: Personal                            history of adenoma (10 mm or greater in size).                            Previous examinations 2011 and. Most recently here                            2018 with 12 mm tubular adenoma. Medicines:                Monitored Anesthesia Care Procedure:                Pre-Anesthesia Assessment:                           - Prior to the procedure, a History and Physical                            was performed, and patient medications and                            allergies were reviewed. The patient's tolerance of                            previous anesthesia was also reviewed. The risks                            and benefits of the procedure and the sedation                            options and risks were discussed with the patient.                            All questions were answered, and informed consent                            was obtained. Prior Anticoagulants: The patient has                            taken no anticoagulant or antiplatelet agents. ASA                            Grade Assessment: II - A patient with mild systemic                            disease. After reviewing the risks and benefits,  the patient was deemed in satisfactory condition to                            undergo the procedure.                           After obtaining informed consent, the colonoscope                            was passed under direct vision. Throughout the                            procedure, the patient's blood pressure, pulse, and                            oxygen saturations were monitored  continuously. The                            Olympus CF-HQ190L ([id number]) Colonoscope was                            introduced through the anus and advanced to the the                            cecum, identified by appendiceal orifice and                            ileocecal valve. The ileocecal valve, appendiceal                            orifice, and rectum were photographed. The quality                            of the bowel preparation was excellent. The                            colonoscopy was performed without difficulty. The                            patient tolerated the procedure well. The bowel                            preparation used was SUPREP via split dose                            instruction. Scope In: 3:02:58 PM Scope Out: 3:17:21 PM Scope Withdrawal Time: 0 hours 9 minutes 9 seconds  Total Procedure Duration: 0 hours 14 minutes 23 seconds  Findings:                 Multiple diverticula were found in the left colon.                           The exam was otherwise without abnormality on  direct and retroflexion views. Complications:            No immediate complications. Estimated blood loss:                            None. Estimated Blood Loss:     Estimated blood loss: none. Impression:               - Diverticulosis in the left colon.                           - The examination was otherwise normal on direct                            and retroflexion views.                           - No specimens collected. Recommendation:           - Repeat colonoscopy in 5 years for surveillance                            (personal history of advanced adenoma).                           - Patient has a contact number available for                            emergencies. The signs and symptoms of potential                            delayed complications were discussed with the                            patient. Return to normal activities  tomorrow.                            Written discharge instructions were provided to the                            patient.                           - Resume previous diet.                           - Continue present medications. Docia Chuck. Henrene Pastor, MD 05/25/2022 3:24:22 PM This report has been signed electronically.

## 2022-05-25 NOTE — Progress Notes (Signed)
Pt's states no medical or surgical changes since previsit or office visit. 

## 2022-05-25 NOTE — Progress Notes (Signed)
HISTORY OF PRESENT ILLNESS:  Javier King is a 67 y.o. male who presents today for surveillance colonoscopy.  Prior exam in [place].  Most recent exam 2018 with 12 mm tubular adenoma.  Now for follow-up  REVIEW OF SYSTEMS:  All non-GI ROS negative except for  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   Cancer ([place]) [phone]   skin cancer   DDD (degenerative disc disease), lumbar    Depression 01/14/2016   Edema of both lower extremities    Erectile dysfunction    Essential hypertension 01/14/2016   Hepatic steatosis    History of TIA (transient ischemic attack)    per pt/ unsure if had TIA    Hyperlipidemia    Lower back pain    Pericarditis 1986   Tubular adenoma of colon    02/17/10    Past Surgical History:  Procedure Laterality Date   COLONOSCOPY     OTHER SURGICAL HISTORY  2003,2004   Back surgery/ 2 laminectomies in lower back   TONSILLECTOMY      Social History Javier King  reports that he has never smoked. He has never used smokeless tobacco. He reports current alcohol use of about 3.0 standard drinks of alcohol per week. He reports that he does not use drugs.  family history includes Alzheimer's disease in his father; Cancer in his mother; Diabetes in his sister; Heart disease in his mother; Leukemia in his sister; Parkinsonism in his father; Thyroid disease in his mother.  Allergies  Allergen Reactions   Penicillins Itching       PHYSICAL EXAMINATION: Vital signs: BP (!) 140/82   Pulse 80   Temp (!) 97.5 F (36.4 C)   Ht '6\' 1"'$  (1.854 m)   Wt 217 lb 9.6 oz (98.7 kg)   SpO2 97%   BMI 28.71 kg/m  General: Well-developed, well-nourished, no acute distress HEENT: Sclerae are anicteric, conjunctiva pink. Oral mucosa intact Lungs: Clear Heart: Regular Abdomen: soft, nontender, nondistended, no obvious ascites, no peritoneal signs, normal  bowel sounds. No organomegaly. Extremities: No edema Psychiatric: alert and oriented x3. Cooperative     ASSESSMENT:  Personal history of advanced adenoma   PLAN:   Surveillance colonoscopy

## 2022-05-25 NOTE — Patient Instructions (Signed)
Please read handouts provided. Continue present medications. Repeat colonoscopy in 5 years for screening.   YOU HAD AN ENDOSCOPIC PROCEDURE TODAY AT THE [facility]:   Refer to the procedure report that was given to you for any specific questions about what was found during the examination.  If the procedure report does not answer your questions, please call your gastroenterologist to clarify.  If you requested that your care partner not be given the details of your procedure findings, then the procedure report has been included in a sealed envelope for you to review at your convenience later.  YOU SHOULD EXPECT: Some feelings of bloating in the abdomen. Passage of more gas than usual.  Walking can help get rid of the air that was put into your GI tract during the procedure and reduce the bloating. If you had a lower endoscopy (such as a colonoscopy or flexible sigmoidoscopy) you may notice spotting of blood in your stool or on the toilet paper. If you underwent a bowel prep for your procedure, you may not have a normal bowel movement for a few days.  Please Note:  You might notice some irritation and congestion in your nose or some drainage.  This is from the oxygen used during your procedure.  There is no need for concern and it should clear up in a day or so.  SYMPTOMS TO REPORT IMMEDIATELY:  Following lower endoscopy (colonoscopy or flexible sigmoidoscopy):  Excessive amounts of blood in the stool  Significant tenderness or worsening of abdominal pains  Swelling of the abdomen that is new, acute  Fever of 100F or higher  For urgent or emergent issues, a gastroenterologist can be reached at any hour by calling [phone]. Do not use MyChart messaging for urgent concerns.    DIET:  We do recommend a small meal at first, but then you may proceed to your regular diet.  Drink plenty of fluids but you should avoid alcoholic beverages for 24 hours.  ACTIVITY:  You should plan  to take it easy for the rest of today and you should NOT DRIVE or use heavy machinery until tomorrow (because of the sedation medicines used during the test).    FOLLOW UP: Our staff will call the number listed on your records the next business day following your procedure.  We will call around 7:15- 8:00 am to check on you and address any questions or concerns that you may have regarding the information given to you following your procedure. If we do not reach you, we will leave a message.     If any biopsies were taken you will be contacted by phone or by letter within the next 1-3 weeks.  Please call us at [phone] if you have not heard about the biopsies in 3 weeks.    SIGNATURES/CONFIDENTIALITY: You and/or your care partner have signed paperwork which will be entered into your electronic medical record.  These signatures attest to the fact that that the information above on your After Visit Summary has been reviewed and is understood.  Full responsibility of the confidentiality of this discharge information lies with you and/or your care-partner. 

## 2022-05-26 ENCOUNTER — Telehealth: Payer: Self-pay | Admitting: *Deleted

## 2022-05-26 ENCOUNTER — Other Ambulatory Visit: Payer: Self-pay | Admitting: *Deleted

## 2022-05-26 NOTE — Telephone Encounter (Signed)
  Follow up Call-    Row Labels 05/25/2022    2:27 PM  Call back number   Section Header. No data exists in this row.   Post procedure Call Back phone  #   [phone]  Permission to leave phone message   Yes     Patient questions:  Do you have a fever, pain , or abdominal swelling? No. Pain Score  0 *  Have you tolerated food without any problems? Yes.    Have you been able to return to your normal activities? Yes.    Do you have any questions about your discharge instructions: Diet   No. Medications  No. Follow up visit  No.  Do you have questions or concerns about your Care? No.  Actions: * If pain score is 4 or above: No action needed, pain <4.

## 2022-05-29 ENCOUNTER — Encounter: Payer: Self-pay | Admitting: Bariatrics

## 2022-05-29 ENCOUNTER — Ambulatory Visit: Payer: BC Managed Care – PPO | Admitting: Bariatrics

## 2022-05-29 VITALS — BP 133/67 | HR 61 | Temp 97.4°F | Ht 73.0 in | Wt 215.0 lb

## 2022-05-29 DIAGNOSIS — E88819 Insulin resistance, unspecified: Secondary | ICD-10-CM

## 2022-05-29 DIAGNOSIS — Z6828 Body mass index (BMI) 28.0-28.9, adult: Secondary | ICD-10-CM

## 2022-05-29 DIAGNOSIS — E669 Obesity, unspecified: Secondary | ICD-10-CM | POA: Diagnosis not present

## 2022-05-29 DIAGNOSIS — I10 Essential (primary) hypertension: Secondary | ICD-10-CM | POA: Diagnosis not present

## 2022-06-06 ENCOUNTER — Ambulatory Visit: Payer: BC Managed Care – PPO | Attending: Internal Medicine | Admitting: Internal Medicine

## 2022-06-06 ENCOUNTER — Encounter: Payer: Self-pay | Admitting: Internal Medicine

## 2022-06-06 VITALS — BP 138/74 | HR 72 | Ht 73.0 in | Wt 216.8 lb

## 2022-06-06 DIAGNOSIS — R0609 Other forms of dyspnea: Secondary | ICD-10-CM

## 2022-06-06 DIAGNOSIS — I10 Essential (primary) hypertension: Secondary | ICD-10-CM

## 2022-06-06 DIAGNOSIS — Z79899 Other long term (current) drug therapy: Secondary | ICD-10-CM

## 2022-06-06 DIAGNOSIS — I480 Paroxysmal atrial fibrillation: Secondary | ICD-10-CM | POA: Diagnosis not present

## 2022-06-06 DIAGNOSIS — R072 Precordial pain: Secondary | ICD-10-CM

## 2022-06-06 DIAGNOSIS — E782 Mixed hyperlipidemia: Secondary | ICD-10-CM

## 2022-06-06 MED ORDER — LISINOPRIL 40 MG PO TABS: 40.0000 mg | ORAL_TABLET | Freq: Every day | ORAL | 3 refills | Status: DC

## 2022-06-06 NOTE — Patient Instructions (Signed)
Medication Instructions:  Your physician recommends that you continue on your current medications as directed. Please refer to the Current Medication list given to you today.  *If you need a refill on your cardiac medications before your next appointment, please call your pharmacy*   Follow-Up: At [facility], you and your health needs are our priority.  As part of our continuing mission to provide you with exceptional heart care, we have created designated Provider Care Teams.  These Care Teams include your primary Cardiologist (physician) and Advanced Practice Providers (APPs -  Physician Assistants and Nurse Practitioners) who all work together to provide you with the care you need, when you need it.  We recommend signing up for the patient portal called "MyChart".  Sign up information is provided on this After Visit Summary.  MyChart is used to connect with patients for Virtual Visits (Telemedicine).  Patients are able to view lab/test results, encounter notes, upcoming appointments, etc.  Non-urgent messages can be sent to your provider as well.   To learn more about what you can do with MyChart, go to [email or web address].    Your next appointment:    6-7 months  Provider:   Elouise Munroe, MD    ** call before the end of January for your next appointment

## 2022-06-06 NOTE — Progress Notes (Signed)
Cardiology Office Note:    Date:  06/06/2022   ID:  Javier King, DOB 23-Dec-1955, MRN [id number]  PCP:  Shelda Pal, DO  Cardiologist:  Elouise Munroe, MD  Electrophysiologist:  None   Referring MD: Shelda Pal*   Chief Complaint/Reason for Referral: Afib  History of Present Illness:    Javier King is a 67 y.o. male with a history of anxiety and depression, musculoskeletal pain, HTN, possible TIA, HLD, pericarditis who presents for follow up of incidentally noted atrial fibrillation.   At a prior visit, he continued on aspirin and metoprolol. We participated in shared decision making. He felt that his diagnosis of TIA was questionable, and therefore would not meet the threshold for anticoagulation for afib (would only score 1 point for hypertension). He preferred not to take anticoagulation at the time, however we did discuss that at age 35 he would meet criteria for [facility] and we would discuss it over serial follow up. Was feeling well overall. His daughter was expecting a baby.  Today, he present doing overall well. He is currently in atrial fibrillation and denies feeling any of the symptoms.  He feels it is surprising that he is in atrial fibrillation given feeling well during exercise and no interval symptoms since her last visit 10/26/2020.  We reviewed the EKG in detail together and compared it to the last one in the room.  Irregular rhythm with no detectable P wave.  He has lost significant weight by exercising and walking a lot more. He has also been trying the [facility] weight loss program. At the last visit he was 305 lbs and today is 216 lbs. I have congratulated him on his success with health improvement.  He is compliant with his medications. His blood pressure today is 138/74, though he reports having better readings while at the weight loss clinic.   We discussed possibly stopping the baby aspirin given overall low risk with no coronary artery  disease, questionable diagnosis of TIA.  We reviewed guidelines surrounding atrial fibrillation and aspirin use.  We discussed his cholesterol as well. At the end of 2022, his LDL was 57. 90 now.  He is not sure why the LDL may have gone up, his diet has changed, only for the better. Since his metabolism is is low per weight loss clinic, he tends to eat more protein. He doesn't eat red meats often.  He is doing extremely well with diet lifestyle modifications.  He reports that since losing weight, the lower extremity swelling has improved significantly.   He denies any palpitations, chest pain, shortness of breath, or peripheral edema. No lightheadedness, headaches, syncope, orthopnea, or PND.  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   Cancer ([place]) [phone]   skin cancer   DDD (degenerative disc disease), lumbar    Depression 01/14/2016   Edema of both lower extremities    Erectile dysfunction    Essential hypertension 01/14/2016   Hepatic steatosis    History of TIA (transient ischemic attack)    per pt/ unsure if had TIA    Hyperlipidemia    Lower back pain    Pericarditis 1986   Tubular adenoma of colon    02/17/10    Past Surgical History:  Procedure Laterality Date   COLONOSCOPY     OTHER SURGICAL HISTORY  2003,2004   Back surgery/ 2  laminectomies in lower back   TONSILLECTOMY      Current Medications: Current Meds  Medication Sig   aspirin EC 81 MG tablet Take 81 mg by mouth daily.   COVID-19 mRNA Vac-TriS, Pfizer, (COMIRNATY) SUSP injection Inject into the muscle.   cyclobenzaprine (FLEXERIL) 5 MG tablet Take 1 tablet (5 mg total) by mouth 2 (two) times daily as needed for muscle spasms.   lisinopril (ZESTRIL) 40 MG tablet Take 1 tablet (40 mg total) by mouth daily.   pantoprazole (PROTONIX) 40 MG tablet TAKE 1 TABLET DAILY BEFORE BREAKFAST   tadalafil (CIALIS)  20 MG tablet Take 0.5-1 tablets (10-20 mg total) by mouth every other day as needed for erectile dysfunction.   traZODone (DESYREL) 100 MG tablet TAKE 1 TABLET AT BEDTIME.   zolpidem (AMBIEN) 5 MG tablet TAKE 1 TABLET AT BEDTIME ASNEEDED FOR SLEEP     Allergies:   Penicillins   Social History   Tobacco Use   Smoking status: Never   Smokeless tobacco: Never  Vaping Use   Vaping Use: Never used  Substance Use Topics   Alcohol use: Yes    Alcohol/week: 3.0 standard drinks of alcohol    Types: 1 Glasses of wine, 1 Cans of beer, 1 Shots of liquor per week    Comment: occassional   Drug use: No     Family History: The patient's family history includes Alzheimer's disease in his father; Cancer in his mother; Diabetes in his sister; Heart disease in his mother; Leukemia in his sister; Parkinsonism in his father; Thyroid disease in his mother. There is no history of Colon cancer or Colon polyps.  ROS:   Please see the history of present illness.   (+) Weight loss (exercise, walking) All other systems reviewed and are negative.  EKGs/Labs/Other Studies Reviewed:    The following studies were reviewed today:  IMPRESSIONS     1. Left ventricular ejection fraction, by estimation, is 60 to 65%. The  left ventricle has normal function. The left ventrical is not well  visualized to exclude regional wall motion abnormalities. Left ventricular  diastolic parameters were normal.   2. Right ventricular systolic function is normal. The right ventricular  size is normal. Tricuspid regurgitation signal is inadequate for assessing  PA pressure.   3. The mitral valve is normal in structure and function. no evidence of  mitral valve regurgitation. No evidence of mitral stenosis.   4. The aortic valve was not well visualized. Aortic valve regurgitation  is not visualized. No aortic stenosis is present.   5. The inferior vena cava is normal in size with greater than 50%  respiratory variability,  suggesting right atrial pressure of 3 mmHg.    EKG:  EKG is personally reviewed. 06/06/2022: A fib. Rate 72 bpm. Poor R wave progression.  10/26/2020: NSR   Recent Labs: 03/30/2022: ALT 34; BUN 26; Creatinine, Ser 1.07; Hemoglobin 13.6; Platelets 236; Potassium 5.0; Sodium 134  Recent Lipid Panel    Component Value Date/Time   CHOL 149 03/30/2022 1620   TRIG 75 03/30/2022 1620   HDL 44 03/30/2022 1620   CHOLHDL 2 10/07/2021 1334   VLDL 11.2 10/07/2021 1334   LDLCALC 90 03/30/2022 1620   LDLCALC 134 (H) 03/04/2020 0759   LDLDIRECT 84.0 10/05/2020 0827    Physical Exam:    VS:  BP 138/74   Pulse 72   Ht '6\' 1"'$  (1.854 m)   Wt 216 lb 12.8 oz (98.3 kg)   SpO2 98%  BMI 28.60 kg/m     Wt Readings from Last 5 Encounters:  06/06/22 216 lb 12.8 oz (98.3 kg)  05/29/22 215 lb (97.5 kg)  05/25/22 217 lb 9.6 oz (98.7 kg)  04/28/22 212 lb (96.2 kg)  04/27/22 208 lb (94.3 kg)    Constitutional: No acute distress Eyes: sclera non-icteric, normal conjunctiva and lids ENMT: normal dentition, moist mucous membranes Cardiovascular: irregular rhythm, normal rate, no murmurs. S1 and S2 normal. No jugular venous distention.  Respiratory: clear to auscultation bilaterally GI : normal bowel sounds, soft and nontender. No distention.   MSK: extremities warm, well perfused. No edema.  NEURO: grossly nonfocal exam, moves all extremities. PSYCH: alert and oriented x 3, normal mood and affect.   ASSESSMENT:    1. Paroxysmal atrial fibrillation (HCC)   2. Essential hypertension   3. Medication management   4. Precordial pain   5. Dyspnea on exertion   6. Mixed hyperlipidemia     PLAN:    Essential hypertension - Plan: EKG 12-Lead - overall well controlled lisinopril in monotherapy.  Continue at current dose and refill today. Improved with weight loss.  Precordial pain Dyspnea on exertion -No recurrence recently, continue to observe.  Normal coronary CTA. With that, can consider  stopping ASA. Hx of TIA that patient does not truly feel was a TIA. He will consider and we participated in shared decision making.  Paroxysmal atrial fibrillation (HCC) -Stage IIIa atrial fibrillation -In rate controlled atrial fibrillation today.  Discussed with the patient today that his CHADS2 Vasc score will be 2 later this year, and we discussed stroke risk reduction at follow up. Likely will start anticoagulation, given afib on EKG today. He is asymptomatic. I have encouraged him to obtain wearable technology such as [employer]. If he remains persistently in atrial fibrillation we will quantify and consider cardioversion/early referral to EP for ablation review.  Mixed hyperlipidemia -LDL not at goal currently, will address with diet. Otherwise labs optimized.   Total time of encounter: 30 minutes total time of encounter, including 25 minutes spent in face-to-face patient care on the date of this encounter. This time includes coordination of care and counseling regarding above mentioned problem list. Remainder of non-face-to-face time involved reviewing chart documents/testing relevant to the patient encounter and documentation in the medical record. I have independently reviewed documentation from referring provider.   Cherlynn Kaiser, MD, [facility]   Medication Adjustments/Labs and Tests Ordered: Current medicines are reviewed at length with the patient today.  Concerns regarding medicines are outlined above.   Orders Placed This Encounter  Procedures   EKG 12-Lead   Meds ordered this encounter  Medications   lisinopril (ZESTRIL) 40 MG tablet    Sig: Take 1 tablet (40 mg total) by mouth daily.    Dispense:  90 tablet    Refill:  3   Patient Instructions  Medication Instructions:  Your physician recommends that you continue on your current medications as directed. Please refer to the Current Medication list given to you today.  *If you need a  refill on your cardiac medications before your next appointment, please call your pharmacy*   Follow-Up: At [facility], you and your health needs are our priority.  As part of our continuing mission to provide you with exceptional heart care, we have created designated Provider Care Teams.  These Care Teams include your primary Cardiologist (physician) and Advanced Practice Providers (APPs -  Physician Assistants and Nurse Practitioners)  who all work together to provide you with the care you need, when you need it.  We recommend signing up for the patient portal called "MyChart".  Sign up information is provided on this After Visit Summary.  MyChart is used to connect with patients for Virtual Visits (Telemedicine).  Patients are able to view lab/test results, encounter notes, upcoming appointments, etc.  Non-urgent messages can be sent to your provider as well.   To learn more about what you can do with MyChart, go to [email or web address].    Your next appointment:    6-7 months  Provider:   Elouise Munroe, MD    ** call before the end of January for your next appointment      I,Rachel Rivera,acting as a scribe for Elouise Munroe, MD.,have documented all relevant documentation on the behalf of Elouise Munroe, MD,as directed by  Elouise Munroe, MD while in the presence of Elouise Munroe, MD.  I, Elouise Munroe, MD, have reviewed all documentation for the visit on 06/06/2022. The documentation on today's date of service for the exam, diagnosis, procedures, and orders are all accurate and complete.

## 2022-06-08 NOTE — Progress Notes (Signed)
Chief Complaint:   OBESITY Javier King is here to discuss his progress with his obesity treatment plan along with follow-up of his obesity related diagnoses. Javier King is on the Category 1 Plan and states he is following his eating plan approximately 50% of the time. Javier King states he is doing 5,000 steps daily.  Today's visit was #: 21 Starting weight: 300 lbs Starting date: 12/08/2020 Today's weight: 215 lbs Today's date: 05/29/2022 Total lbs lost to date: 85 Total lbs lost since last in-office visit: +7  Interim History: Javier King is up 7 lbs since his last visit, which was over the holidays. He is holding on to some water weight.  Subjective:   1. Essential hypertension BP controlled on lisinopril.  2. Insulin resistance He is not on medication.  Assessment/Plan:   1. Essential hypertension Continue current treatment plan.  2. Insulin resistance Pt will keep saturated fats low. Increase plant based foods. Minimize sweets and starches.  3. Obesity, current BMI 28.5 Javier King is currently in the action stage of change. As such, his goal is to continue with weight loss efforts. He has agreed to the Category 1 Plan and keeping a food journal and adhering to recommended goals of 1000-1100 calories and 80-90 protein.   Meal planning. Intentional eating.  Exercise goals:  pt will begin exercises (weights).  Behavioral modification strategies: increasing lean protein intake, decreasing simple carbohydrates, increasing vegetables, increasing water intake, decreasing eating out, no skipping meals, meal planning and cooking strategies, keeping healthy foods in the home, and planning for success.  Javier King has agreed to follow-up with our clinic in 4 weeks. He was informed of the importance of frequent follow-up visits to maximize his success with intensive lifestyle modifications for his multiple health conditions.   Objective:   Blood pressure 133/67, pulse 61, temperature (!) 97.4  F (36.3 C), height '6\' 1"'$  (1.854 m), weight 215 lb (97.5 kg), SpO2 99 %. Body mass index is 28.37 kg/m.  General: Cooperative, alert, well developed, in no acute distress. HEENT: Conjunctivae and lids unremarkable. Cardiovascular: Regular rhythm.  Lungs: Normal work of breathing. Neurologic: No focal deficits.   Lab Results  Component Value Date   CREATININE 1.07 03/30/2022   BUN 26 03/30/2022   NA 134 03/30/2022   K 5.0 03/30/2022   CL 93 (L) 03/30/2022   CO2 26 03/30/2022   Lab Results  Component Value Date   ALT 34 03/30/2022   AST 33 03/30/2022   ALKPHOS 63 03/30/2022   BILITOT 0.9 03/30/2022   Lab Results  Component Value Date   HGBA1C 5.4 04/20/2021   HGBA1C 5.5 12/08/2020   Lab Results  Component Value Date   INSULIN 5.4 04/20/2021   INSULIN 6.1 12/08/2020   Lab Results  Component Value Date   TSH 2.370 12/08/2020   Lab Results  Component Value Date   CHOL 149 03/30/2022   HDL 44 03/30/2022   LDLCALC 90 03/30/2022   LDLDIRECT 84.0 10/05/2020   TRIG 75 03/30/2022   CHOLHDL 2 10/07/2021   Lab Results  Component Value Date   VD25OH 65.4 03/30/2022   VD25OH 62.5 04/20/2021   VD25OH 27.9 (L) 12/08/2020   Lab Results  Component Value Date   WBC 6.5 03/30/2022   HGB 13.6 03/30/2022   HCT 41.8 03/30/2022   MCV 90 03/30/2022   PLT 236 03/30/2022   Lab Results  Component Value Date   IRON 63 10/10/2021   FERRITIN 225 03/30/2022   Attestation Statements:  Reviewed by clinician on day of visit: allergies, medications, problem list, medical history, surgical history, family history, social history, and previous encounter notes.  I, Kathlene November, BS, CMA, am acting as transcriptionist for [employer], DO.  I have reviewed the above documentation for accuracy and completeness, and I agree with the above. Jearld Lesch, DO

## 2022-06-12 ENCOUNTER — Encounter: Payer: Self-pay | Admitting: Bariatrics

## 2022-06-21 ENCOUNTER — Other Ambulatory Visit: Payer: Self-pay | Admitting: Family Medicine

## 2022-06-26 ENCOUNTER — Encounter: Payer: Self-pay | Admitting: Bariatrics

## 2022-06-26 ENCOUNTER — Ambulatory Visit: Payer: BC Managed Care – PPO | Admitting: Bariatrics

## 2022-06-26 VITALS — BP 139/80 | HR 67 | Temp 97.4°F | Ht 73.0 in | Wt 210.0 lb

## 2022-06-26 DIAGNOSIS — Z6827 Body mass index (BMI) 27.0-27.9, adult: Secondary | ICD-10-CM

## 2022-06-26 DIAGNOSIS — E782 Mixed hyperlipidemia: Secondary | ICD-10-CM

## 2022-06-26 DIAGNOSIS — E88819 Insulin resistance, unspecified: Secondary | ICD-10-CM | POA: Diagnosis not present

## 2022-06-26 DIAGNOSIS — Z6826 Body mass index (BMI) 26.0-26.9, adult: Secondary | ICD-10-CM | POA: Insufficient documentation

## 2022-07-03 DIAGNOSIS — L821 Other seborrheic keratosis: Secondary | ICD-10-CM | POA: Diagnosis not present

## 2022-07-03 DIAGNOSIS — Z85820 Personal history of malignant melanoma of skin: Secondary | ICD-10-CM | POA: Diagnosis not present

## 2022-07-03 DIAGNOSIS — L570 Actinic keratosis: Secondary | ICD-10-CM | POA: Diagnosis not present

## 2022-07-03 DIAGNOSIS — D225 Melanocytic nevi of trunk: Secondary | ICD-10-CM | POA: Diagnosis not present

## 2022-07-08 NOTE — Progress Notes (Unsigned)
Chief Complaint:   OBESITY Javier King is here to discuss his progress with his obesity treatment plan along with follow-up of his obesity related diagnoses. Javier King is on the Category 1 Plan and keeping a food journal and adhering to recommended goals of 1000-1100 calories and 80-90 grams protein and states he is following his eating plan approximately 80% of the time. Ethel states he is walking 7,000 steps daily.  Today's visit was #: 22 Starting weight: 300 lbs Starting date: 12/08/2020 Today's weight: 210 lbs Today's date: 06/26/2022 Total lbs lost to date: 90 Total lbs lost since last in-office visit: 5  Interim History: Javier King is down an additional 5 lbs since his last visit. He has a new diagnosis of Afib and is considering cardiac ablation. His weight goal is 200 lbs.  Subjective:   1. Insulin resistance Javier King is not on medication.  2. Mixed hyperlipidemia Javier King is not on medication.  Assessment/Plan:   1. Insulin resistance Will continue to minimize all carbohydrates (sweets and starches).  2. Mixed hyperlipidemia No trans fats Minimize saturated fats Add more MUFA's and PUFA's   3. Morbid obesity ([name]) 4. BMI 27.0-27.9,adult Will adhere closely to the plan 80-90% of the time. Meal planning. Will do IC at next office visit.  Javier King is currently in the action stage of change. As such, his goal is to continue with weight loss efforts. He has agreed to the Category 1 Plan.   Exercise goals:  Javier King is walking most days and using free weights.  Behavioral modification strategies: increasing lean protein intake, decreasing simple carbohydrates, increasing vegetables, increasing water intake, decreasing eating out, no skipping meals, meal planning and cooking strategies, keeping healthy foods in the home, and planning for success.  Javier King has agreed to follow-up with our clinic in 4 weeks. He was informed of the importance of frequent follow-up visits to  maximize his success with intensive lifestyle modifications for his multiple health conditions.   Objective:   Blood pressure 139/80, pulse 67, temperature (!) 97.4 F (36.3 C), height 6' 1"$  (1.854 m), weight 210 lb (95.3 kg), SpO2 100 %. Body mass index is 27.71 kg/m.  General: Cooperative, alert, well developed, in no acute distress. HEENT: Conjunctivae and lids unremarkable. Cardiovascular: Regular rhythm.  Lungs: Normal work of breathing. Neurologic: No focal deficits.   Lab Results  Component Value Date   CREATININE 1.07 03/30/2022   BUN 26 03/30/2022   NA 134 03/30/2022   K 5.0 03/30/2022   CL 93 (L) 03/30/2022   CO2 26 03/30/2022   Lab Results  Component Value Date   ALT 34 03/30/2022   AST 33 03/30/2022   ALKPHOS 63 03/30/2022   BILITOT 0.9 03/30/2022   Lab Results  Component Value Date   HGBA1C 5.4 04/20/2021   HGBA1C 5.5 12/08/2020   Lab Results  Component Value Date   INSULIN 5.4 04/20/2021   INSULIN 6.1 12/08/2020   Lab Results  Component Value Date   TSH 2.370 12/08/2020   Lab Results  Component Value Date   CHOL 149 03/30/2022   HDL 44 03/30/2022   LDLCALC 90 03/30/2022   LDLDIRECT 84.0 10/05/2020   TRIG 75 03/30/2022   CHOLHDL 2 10/07/2021   Lab Results  Component Value Date   VD25OH 65.4 03/30/2022   VD25OH 62.5 04/20/2021   VD25OH 27.9 (L) 12/08/2020   Lab Results  Component Value Date   WBC 6.5 03/30/2022   HGB 13.6 03/30/2022   HCT 41.8 03/30/2022  MCV 90 03/30/2022   PLT 236 03/30/2022   Lab Results  Component Value Date   IRON 63 10/10/2021   FERRITIN 225 03/30/2022   Attestation Statements:   Reviewed by clinician on day of visit: allergies, medications, problem list, medical history, surgical history, family history, social history, and previous encounter notes.  I, Kathlene November, BS, CMA, am acting as transcriptionist for [employer], DO.  I have reviewed the above documentation for accuracy and completeness,  and I agree with the above. Jearld Lesch, DO

## 2022-07-09 ENCOUNTER — Other Ambulatory Visit: Payer: Self-pay | Admitting: Family Medicine

## 2022-07-11 ENCOUNTER — Ambulatory Visit: Payer: BC Managed Care – PPO | Admitting: Internal Medicine

## 2022-07-26 ENCOUNTER — Ambulatory Visit (INDEPENDENT_AMBULATORY_CARE_PROVIDER_SITE_OTHER): Payer: BC Managed Care – PPO | Admitting: Bariatrics

## 2022-07-26 ENCOUNTER — Encounter: Payer: Self-pay | Admitting: Bariatrics

## 2022-07-26 VITALS — BP 125/70 | HR 67 | Temp 97.5°F | Ht 73.0 in | Wt 205.0 lb

## 2022-07-26 DIAGNOSIS — E782 Mixed hyperlipidemia: Secondary | ICD-10-CM | POA: Diagnosis not present

## 2022-07-26 DIAGNOSIS — R0602 Shortness of breath: Secondary | ICD-10-CM | POA: Diagnosis not present

## 2022-07-26 DIAGNOSIS — R5383 Other fatigue: Secondary | ICD-10-CM

## 2022-07-26 DIAGNOSIS — Z6827 Body mass index (BMI) 27.0-27.9, adult: Secondary | ICD-10-CM | POA: Diagnosis not present

## 2022-08-02 DIAGNOSIS — L570 Actinic keratosis: Secondary | ICD-10-CM | POA: Diagnosis not present

## 2022-08-08 ENCOUNTER — Other Ambulatory Visit: Payer: Self-pay | Admitting: Family Medicine

## 2022-08-08 ENCOUNTER — Encounter: Payer: Self-pay | Admitting: Family Medicine

## 2022-08-08 DIAGNOSIS — G4700 Insomnia, unspecified: Secondary | ICD-10-CM

## 2022-08-08 MED ORDER — ZOLPIDEM TARTRATE 5 MG PO TABS: ORAL_TABLET | ORAL | 1 refills | Status: DC

## 2022-08-08 NOTE — Progress Notes (Signed)
Chief Complaint:   OBESITY Javier King is here to discuss his progress with his obesity treatment plan along with follow-up of his obesity related diagnoses. Javier King is on the Category 1 plan and states he is following his eating plan approximately 85% of the time. Javier King states he is walking 7000 steps daily.  Today's visit was #: 23 Starting weight: 300 lbs Starting date: 12/08/20 Today's weight: 205 lbs Today's date: 07/26/22 Total lbs lost to date: 95 Total lbs lost since last in-office visit: -5  Interim History: He is down an additional 5 pounds since his last visit.  His goal weight was set at 200 pounds.  Subjective:   1. Other fatigue and shortness of breath RMR actual greater than expected. RMR actual-2376 kcal Expected-2033 kcal RMR previously-1153 (July 2022)  2. Mixed hyperlipidemia No medications.  Assessment/Plan:   1. Other fatigue and shortness of breath 1.  Increased RMR will change his plan today.  Read and discussed RMR in detail and interpreted. 2.  Discussed bioimpedance findings. 3.  Increase core exercise and weights. 4.  [employer] on [employer]   2. Mixed hyperlipidemia 1.  Handout-healthy versus unhealthy fats.  4. Morbid obesity ([place])  BMI 27.0-27.9,adult 1.  Meal planning. 2.  Set ideal weight, weigh weekly at home. 3.  Increase water. 4.  Will make adjustments in plan and exercise.  Javier King is currently in the action stage of change. As such, his goal is to continue with weight loss efforts. He has agreed to the Category 1 plan.  Exercise goals: 1.  Exercise 2.  [employer] on [employer] modification strategies: increasing lean protein intake, decreasing simple carbohydrates, increasing vegetables, increasing water intake, decreasing eating out, no skipping meals, meal planning and cooking strategies, keeping healthy foods in the home, and planning for success.  Javier King has agreed to follow-up with our clinic in 4 weeks. He  was informed of the importance of frequent follow-up visits to maximize his success with intensive lifestyle modifications for his multiple health conditions.   Objective:   Blood pressure 125/70, pulse 67, temperature (!) 97.5 F (36.4 C), height 6\' 1"  (1.854 m), weight 205 lb (93 kg), SpO2 100 %. Body mass index is 27.05 kg/m.  General: Cooperative, alert, well developed, in no acute distress. HEENT: Conjunctivae and lids unremarkable. Cardiovascular: Regular rhythm.  Lungs: Normal work of breathing. Neurologic: No focal deficits.   Lab Results  Component Value Date   CREATININE 1.07 03/30/2022   BUN 26 03/30/2022   NA 134 03/30/2022   K 5.0 03/30/2022   CL 93 (L) 03/30/2022   CO2 26 03/30/2022   Lab Results  Component Value Date   ALT 34 03/30/2022   AST 33 03/30/2022   ALKPHOS 63 03/30/2022   BILITOT 0.9 03/30/2022   Lab Results  Component Value Date   HGBA1C 5.4 04/20/2021   HGBA1C 5.5 12/08/2020   Lab Results  Component Value Date   INSULIN 5.4 04/20/2021   INSULIN 6.1 12/08/2020   Lab Results  Component Value Date   TSH 2.370 12/08/2020   Lab Results  Component Value Date   CHOL 149 03/30/2022   HDL 44 03/30/2022   LDLCALC 90 03/30/2022   LDLDIRECT 84.0 10/05/2020   TRIG 75 03/30/2022   CHOLHDL 2 10/07/2021   Lab Results  Component Value Date   VD25OH 65.4 03/30/2022   VD25OH 62.5 04/20/2021   VD25OH 27.9 (L) 12/08/2020   Lab Results  Component Value Date  WBC 6.5 03/30/2022   HGB 13.6 03/30/2022   HCT 41.8 03/30/2022   MCV 90 03/30/2022   PLT 236 03/30/2022   Lab Results  Component Value Date   IRON 63 10/10/2021   FERRITIN 225 03/30/2022    Attestation Statements:   Reviewed by clinician on day of visit: allergies, medications, problem list, medical history, surgical history, family history, social history, and previous encounter notes.  I, Dawn Whitmire, FNP-C, am acting as transcriptionist for Dr. Jearld Lesch.  I have  reviewed the above documentation for accuracy and completeness, and I agree with the above. Jearld Lesch, DO

## 2022-08-14 ENCOUNTER — Encounter: Payer: Self-pay | Admitting: Bariatrics

## 2022-08-15 ENCOUNTER — Encounter: Payer: Self-pay | Admitting: Family Medicine

## 2022-08-15 ENCOUNTER — Other Ambulatory Visit: Payer: Self-pay | Admitting: Family Medicine

## 2022-08-15 MED ORDER — TADALAFIL 20 MG PO TABS: ORAL_TABLET | ORAL | 0 refills | Status: DC

## 2022-08-22 ENCOUNTER — Encounter: Payer: Self-pay | Admitting: Bariatrics

## 2022-08-22 ENCOUNTER — Ambulatory Visit: Payer: BC Managed Care – PPO | Admitting: Bariatrics

## 2022-08-22 VITALS — BP 118/73 | HR 62 | Temp 97.7°F | Ht 73.0 in | Wt 203.0 lb

## 2022-08-22 DIAGNOSIS — E782 Mixed hyperlipidemia: Secondary | ICD-10-CM

## 2022-08-22 DIAGNOSIS — Z6826 Body mass index (BMI) 26.0-26.9, adult: Secondary | ICD-10-CM | POA: Diagnosis not present

## 2022-08-22 NOTE — Progress Notes (Unsigned)
   WEIGHT SUMMARY AND BIOMETRICS  Weight Lost Since Last Visit: 2lb  Vitals Temp: 97.7 F (36.5 C) BP: 118/73 Pulse Rate: 62 SpO2: 98 %   Anthropometric Measurements Height: 6\' 1"  (1.854 m) Weight: 203 lb (92.1 kg) BMI (Calculated): 26.79 Weight at Last Visit: 205lb Weight Lost Since Last Visit: 2lb Weight Gained Since Last Visit: 0 Starting Weight: 300lb Total Weight Loss (lbs): 97 lb (44 kg)   Body Composition  Body Fat %: 24.8 % Fat Mass (lbs): 50.4 lbs Muscle Mass (lbs): 145.4 lbs Total Body Water (lbs): 124.6 lbs Visceral Fat Rating : 13   Other Clinical Data Fasting: no Labs: no Today's Visit #: 24 Starting Date: 12/08/20    OBESITY Zyren is here to discuss his progress with his obesity treatment plan along with follow-up of his obesity related diagnoses.     Nutrition Plan: the Category 1 plan - 85% adherence.  Current exercise: walking and weightlifting  Interim History:  *** {aabnutritionassessment:29213}  Pharmacotherapy: Rahmir is on {dwwpharmacotherapy:29109} Adverse side effects: {dwwse:29122} Hunger is {EWCONTROLASSESSMENT:24261}.  Cravings are {EWCONTROLASSESSMENT:24261}.  Assessment/Plan:   1. Mixed hyperlipidemia ***     {dwwmorbid:29108::"Morbid Obesity"}: Current BMI BMI (Calculated): 26.79   Pharmacotherapy Plan {dwwmed:29123}  {dwwpharmacotherapy:29109}  Yates {CHL AMB IS/IS NOT:210130109} currently in the action stage of change. As such, his goal is to {MWMwtloss#1:210800005}.  He has agreed to {dwwsldiets:29085}.  Exercise goals: {MWM EXERCISE RECS:23473}  Behavioral modification strategies: {dwwslwtlossstrategies:29088}.  Nikalus has agreed to follow-up with our clinic in {NUMBER 1-10:22536} weeks.   No orders of the defined types were placed in this encounter.   There are no discontinued medications.   No orders of the defined types were placed in this encounter.     Objective:   VITALS:  Per patient if applicable, see vitals. GENERAL: Alert and in no acute distress. CARDIOPULMONARY: No increased WOB. Speaking in clear sentences.  PSYCH: Pleasant and cooperative. Speech normal rate and rhythm. Affect is appropriate. Insight and judgement are appropriate. Attention is focused, linear, and appropriate.  NEURO: Oriented as arrived to appointment on time with no prompting.   Attestation Statements:   ***(delete if time-based billing not used) Time spent on visit including the items listed below was *** minutes.  -preparing to see the patient (e.g., review of tests, history, previous notes) -obtaining and/or reviewing separately obtained history -counseling and educating the patient/family/caregiver -documenting clinical information in the electronic or other health record -ordering medications, tests, or procedures -independently interpreting results and communicating results to the patient/ family/caregiver -referring and communicating with other health care professionals  -care coordination   This was prepared with the assistance of Presenter, broadcasting.  Occasional wrong-word or sound-a-like substitutions may have occurred due to the inherent limitations of voice recognition software.

## 2022-08-23 ENCOUNTER — Ambulatory Visit: Payer: BC Managed Care – PPO | Admitting: Bariatrics

## 2022-08-23 ENCOUNTER — Encounter: Payer: Self-pay | Admitting: Bariatrics

## 2022-09-21 ENCOUNTER — Encounter: Payer: Self-pay | Admitting: Bariatrics

## 2022-09-21 ENCOUNTER — Ambulatory Visit (INDEPENDENT_AMBULATORY_CARE_PROVIDER_SITE_OTHER): Payer: BC Managed Care – PPO | Admitting: Bariatrics

## 2022-09-21 VITALS — BP 149/63 | HR 88 | Temp 97.0°F | Ht 73.0 in | Wt 203.0 lb

## 2022-09-21 DIAGNOSIS — Z6826 Body mass index (BMI) 26.0-26.9, adult: Secondary | ICD-10-CM | POA: Diagnosis not present

## 2022-09-21 DIAGNOSIS — E669 Obesity, unspecified: Secondary | ICD-10-CM | POA: Diagnosis not present

## 2022-09-21 DIAGNOSIS — I10 Essential (primary) hypertension: Secondary | ICD-10-CM

## 2022-09-23 ENCOUNTER — Other Ambulatory Visit: Payer: Self-pay | Admitting: Family Medicine

## 2022-09-23 DIAGNOSIS — G4700 Insomnia, unspecified: Secondary | ICD-10-CM

## 2022-09-26 NOTE — Progress Notes (Unsigned)
Chief Complaint:   OBESITY Javier King is here to discuss his progress with his obesity treatment plan along with follow-up of his obesity related diagnoses. Kedrick is on the Category 1 Plan and states he is following his eating plan approximately 70% of the time. Timmothy states he is lifting weights and walking 9,000 steps daily 7 times per week.   Today's visit was #: 25 Starting weight: 300 lbs Starting date: 12/08/2020 Today's weight: 199 lbs Today's date: 09/21/2022 Total lbs lost to date: 101 Total lbs lost since last in-office visit: 4  Interim History: Javier King is down another 4 pounds since his last visit.  His goal weight is 203 pounds.  He wants to tone more.  He is walking more.  Subjective:   1. Essential hypertension Javier King's blood pressure was 149/63.  He is taking lisinopril.  Assessment/Plan:   1. Essential hypertension Javier King will continue his medications, and he will work on eliminating added salt.  2. Generalized obesity  3. BMI 26.0-26.9,adult Javier King is currently in the action stage of change. As such, his goal is to continue with weight loss efforts. He has agreed to the Category 1 Plan.   Meal planning was discussed.  He will weigh himself weekly.  Keep protein, water, and fiber intake high.  Goal weight was discussed.  Anti-inflammatory pyramid was discussed.  Exercise goals: As is.  Toning exercises with free weights were discussed.  He is considering yoga and Pilates.  Behavioral modification strategies: increasing lean protein intake, decreasing simple carbohydrates, increasing vegetables, increasing water intake, decreasing eating out, no skipping meals, meal planning and cooking strategies, keeping healthy foods in the home, and planning for success.  Algenis has agreed to follow-up with our clinic in 4 weeks. He was informed of the importance of frequent follow-up visits to maximize his success with intensive lifestyle modifications for his  multiple health conditions.   Objective:   Blood pressure (!) 149/63, pulse 88, temperature (!) 97 F (36.1 C), height 6\' 1"  (1.854 m), weight 203 lb (92.1 kg), SpO2 100 %. Body mass index is 26.78 kg/m.  General: Cooperative, alert, well developed, in no acute distress. HEENT: Conjunctivae and lids unremarkable. Cardiovascular: Regular rhythm.  Lungs: Normal work of breathing. Neurologic: No focal deficits.   Lab Results  Component Value Date   CREATININE 1.07 03/30/2022   BUN 26 03/30/2022   NA 134 03/30/2022   K 5.0 03/30/2022   CL 93 (L) 03/30/2022   CO2 26 03/30/2022   Lab Results  Component Value Date   ALT 34 03/30/2022   AST 33 03/30/2022   ALKPHOS 63 03/30/2022   BILITOT 0.9 03/30/2022   Lab Results  Component Value Date   HGBA1C 5.4 04/20/2021   HGBA1C 5.5 12/08/2020   Lab Results  Component Value Date   INSULIN 5.4 04/20/2021   INSULIN 6.1 12/08/2020   Lab Results  Component Value Date   TSH 2.370 12/08/2020   Lab Results  Component Value Date   CHOL 149 03/30/2022   HDL 44 03/30/2022   LDLCALC 90 03/30/2022   LDLDIRECT 84.0 10/05/2020   TRIG 75 03/30/2022   CHOLHDL 2 10/07/2021   Lab Results  Component Value Date   VD25OH 65.4 03/30/2022   VD25OH 62.5 04/20/2021   VD25OH 27.9 (L) 12/08/2020   Lab Results  Component Value Date   WBC 6.5 03/30/2022   HGB 13.6 03/30/2022   HCT 41.8 03/30/2022   MCV 90 03/30/2022   PLT 236 03/30/2022  Lab Results  Component Value Date   IRON 63 10/10/2021   FERRITIN 225 03/30/2022   Attestation Statements:   Reviewed by clinician on day of visit: allergies, medications, problem list, medical history, surgical history, family history, social history, and previous encounter notes.   Trude Mcburney, am acting as Energy manager for [employer], DO.  I have reviewed the above documentation for accuracy and completeness, and I agree with the above. Corinna Capra, DO

## 2022-09-27 ENCOUNTER — Encounter: Payer: Self-pay | Admitting: Bariatrics

## 2022-10-23 ENCOUNTER — Encounter: Payer: BC Managed Care – PPO | Admitting: Family Medicine

## 2022-10-30 ENCOUNTER — Telehealth: Payer: Self-pay | Admitting: Bariatrics

## 2022-10-30 NOTE — Telephone Encounter (Signed)
Patient called in today to cancel his 11/02/2022 appointment with Dr. Manson Passey stating he no longer needs [facility] at this time.   Thank you Andrey Spearman

## 2022-11-02 ENCOUNTER — Ambulatory Visit: Payer: BC Managed Care – PPO | Admitting: Bariatrics

## 2022-11-14 ENCOUNTER — Other Ambulatory Visit: Payer: Self-pay | Admitting: Family Medicine

## 2022-11-14 DIAGNOSIS — G4700 Insomnia, unspecified: Secondary | ICD-10-CM

## 2022-11-28 ENCOUNTER — Telehealth: Payer: Self-pay | Admitting: Family Medicine

## 2022-11-28 ENCOUNTER — Other Ambulatory Visit: Payer: Self-pay | Admitting: Family Medicine

## 2022-11-28 MED ORDER — TADALAFIL 20 MG PO TABS: ORAL_TABLET | ORAL | 0 refills | Status: DC

## 2022-11-28 NOTE — Telephone Encounter (Signed)
Last OV---04/21/2022 Last RF---#90 with 1 refill on 08/08/2022

## 2022-11-28 NOTE — Addendum Note (Signed)
Addended by: Scharlene Gloss B on: 11/28/2022 12:53 PM   Modules accepted: Orders

## 2022-11-28 NOTE — Telephone Encounter (Signed)
sent 

## 2022-11-28 NOTE — Telephone Encounter (Signed)
He has been taking the 5 mg (his last bottle and dated 02/07/23 last refill).

## 2022-11-28 NOTE — Telephone Encounter (Signed)
Should've been a refill on that? Or has he been using 10 mg?

## 2022-11-28 NOTE — Telephone Encounter (Signed)
Prescription Request  11/28/2022  Is this a "Controlled Substance" medicine? No  LOV: Visit date not found   What is the name of the medication or equipment?  zolpidem (AMBIEN) 5 MG tablet [[id number]]   Have you contacted your pharmacy to request a refill? No   Which pharmacy would you like this sent to?  [facility] - [place], [place] - [address] [facility] Portal to Registered [facility] [address] [place] [address] Phone: [phone] Fax: [phone]    Patient notified that their request is being sent to the clinical staff for review and that they should receive a response within 2 business days.   Please advise at Mobile [phone] (mobile)

## 2022-11-28 NOTE — Telephone Encounter (Signed)
Patient informed and he will check with them again

## 2022-11-28 NOTE — Telephone Encounter (Signed)
Prescription Request  11/28/2022  Is this a "Controlled Substance" medicine? No  LOV: Visit date not found  What is the name of the medication or equipment? tadalafil (CIALIS) 20 MG tablet [[id number]]   Have you contacted your pharmacy to request a refill? Yes   Which pharmacy would you like this sent to?    [facility] [address] [place], [place] - [address] [place] [address] Phone: [phone] Fax: [phone]    Patient notified that their request is being sent to the clinical staff for review and that they should receive a response within 2 business days.   Please advise at Mobile [phone] (mobile)

## 2022-12-12 ENCOUNTER — Ambulatory Visit (INDEPENDENT_AMBULATORY_CARE_PROVIDER_SITE_OTHER): Payer: BC Managed Care – PPO | Admitting: Family Medicine

## 2022-12-12 ENCOUNTER — Other Ambulatory Visit: Payer: Self-pay | Admitting: Family Medicine

## 2022-12-12 ENCOUNTER — Encounter: Payer: Self-pay | Admitting: Family Medicine

## 2022-12-12 VITALS — BP 120/69 | HR 49 | Temp 98.0°F | Ht 73.0 in | Wt 215.4 lb

## 2022-12-12 DIAGNOSIS — Z0000 Encounter for general adult medical examination without abnormal findings: Secondary | ICD-10-CM | POA: Diagnosis not present

## 2022-12-12 DIAGNOSIS — R972 Elevated prostate specific antigen [PSA]: Secondary | ICD-10-CM

## 2022-12-12 DIAGNOSIS — Z125 Encounter for screening for malignant neoplasm of prostate: Secondary | ICD-10-CM | POA: Diagnosis not present

## 2022-12-12 DIAGNOSIS — Z79899 Other long term (current) drug therapy: Secondary | ICD-10-CM | POA: Diagnosis not present

## 2022-12-12 LAB — COMPREHENSIVE METABOLIC PANEL
ALT: 23 U/L (ref 0–53)
AST: 24 U/L (ref 0–37)
Albumin: 4.3 g/dL (ref 3.5–5.2)
Alkaline Phosphatase: 56 U/L (ref 39–117)
BUN: 17 mg/dL (ref 6–23)
CO2: 29 mEq/L (ref 19–32)
Calcium: 9.8 mg/dL (ref 8.4–10.5)
Chloride: 100 mEq/L (ref 96–112)
Creatinine, Ser: 0.98 mg/dL (ref 0.40–1.50)
GFR: 81.3 mL/min (ref >60.00)
Glucose, Bld: 91 mg/dL (ref 70–99)
Potassium: 4.8 mEq/L (ref 3.5–5.1)
Sodium: 134 mEq/L — ABNORMAL LOW (ref 135–145)
Total Bilirubin: 1.1 mg/dL (ref 0.2–1.2)
Total Protein: 6.9 g/dL (ref 6.0–8.3)

## 2022-12-12 LAB — LIPID PANEL
Cholesterol: 149 mg/dL (ref 0–200)
HDL: 50.7 mg/dL (ref >39.00)
LDL Cholesterol: 85 mg/dL (ref 0–99)
NonHDL: 98.32
Total CHOL/HDL Ratio: 3
Triglycerides: 65 mg/dL (ref 0.0–149.0)
VLDL: 13 mg/dL (ref 0.0–40.0)

## 2022-12-12 LAB — CBC
HCT: 42.6 % (ref 39.0–52.0)
Hemoglobin: 13.7 g/dL (ref 13.0–17.0)
MCHC: 32.2 g/dL (ref 30.0–36.0)
MCV: 93.7 fl (ref 78.0–100.0)
Platelets: 181 10*3/uL (ref 150.0–400.0)
RBC: 4.55 Mil/uL (ref 4.22–5.81)
RDW: 13.8 % (ref 11.5–15.5)
WBC: 5.5 10*3/uL (ref 4.0–10.5)

## 2022-12-12 LAB — PSA: PSA: 2.51 ng/mL (ref 0.10–4.00)

## 2022-12-12 NOTE — Progress Notes (Signed)
Chief Complaint  Patient presents with   Annual Exam    Well Male Javier King is here for a complete physical.   His last physical was >1 year ago.  Current diet: in general, a "healthy" diet.  Current exercise: walking, strength training, stretching Weight trend: stable Fatigue out of ordinary? No. Seat belt? Yes.   Advanced directive? 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   Cancer (HCC) [id number]   skin cancer   DDD (degenerative disc disease), lumbar    Depression 01/14/2016   Edema of both lower extremities    Erectile dysfunction    Essential hypertension 01/14/2016   Hepatic steatosis    History of TIA (transient ischemic attack)    per pt/ unsure if had TIA    Hyperlipidemia    Lower back pain    Pericarditis 1986   Tubular adenoma of colon    02/17/10      Past Surgical History:  Procedure Laterality Date   COLONOSCOPY     OTHER SURGICAL HISTORY  2003,2004   Back surgery/ 2 laminectomies in lower back   TONSILLECTOMY      Medications  Current Outpatient Medications on File Prior to Visit  Medication Sig Dispense Refill   lisinopril (ZESTRIL) 40 MG tablet Take 1 tablet (40 mg total) by mouth daily. 90 tablet 3   pantoprazole (PROTONIX) 40 MG tablet TAKE 1 TABLET DAILY BEFORE BREAKFAST 90 tablet 1   tadalafil (CIALIS) 20 MG tablet TAKE 1/2 TO 1 (ONE-HALF TO ONE) TABLET BY MOUTH EVERY OTHER DAY AS NEEDED FOR ERECTILE DYSFUNCTION 30 tablet 0   traZODone (DESYREL) 100 MG tablet TAKE 1 TABLET AT BEDTIME 90 tablet 1   zolpidem (AMBIEN) 5 MG tablet TAKE 1 TABLET AT BEDTIME ASNEEDED FOR SLEEP 90 tablet 1   cyclobenzaprine (FLEXERIL) 5 MG tablet Take 1 tablet (5 mg total) by mouth 2 (two) times daily as needed for muscle spasms.  (Patient not taking: Reported on 12/12/2022) 180 tablet 0    Allergies Allergies  Allergen Reactions   Penicillins Itching    Family History Family History  Problem Relation Age of Onset   Heart disease Mother    Thyroid disease Mother    Cancer Mother    Alzheimer's disease Father    Parkinsonism Father    Diabetes Sister    Leukemia Sister    Colon cancer Neg Hx    Colon polyps Neg Hx     Review of Systems: Constitutional:  no fevers Eye:  no recent significant change in vision Ear/Nose/Mouth/Throat:  Ears:  no hearing loss Nose/Mouth/Throat:  no complaints of nasal congestion, no sore throat Cardiovascular:  no chest pain Respiratory:  no shortness of breath Gastrointestinal:  no change in bowel habits GU:  Male: negative for dysuria, frequency Musculoskeletal/Extremities:  +intermittent L hip pain Integumentary (Skin/Breast):  no abnormal skin lesions reported Neurologic:  no headaches Endocrine: No unexpected weight changes Hematologic/Lymphatic:  no abnormal bleeding  Exam BP 120/69 (BP Location: Left Arm, Patient Position: Sitting, Cuff Size: Normal)   Pulse (!) 49   Temp 98 F (36.7 C) (Oral)   Ht 6\' 1"  (1.854 m)   Wt 215 lb 6 oz (97.7 kg)   SpO2 99%   BMI 28.42 kg/m  General:  well developed, well nourished, in no apparent distress Skin:  no significant moles, warts, or growths Head:  no masses, lesions, or tenderness Eyes:  pupils equal and round, sclera anicteric without injection Ears:  canals without lesions, TMs shiny without retraction, no obvious effusion, no erythema Nose:  nares patent, mucosa normal Throat/Pharynx:  lips and gingiva without lesion; tongue and uvula midline; non-inflamed pharynx; no exudates or postnasal drainage Neck: neck supple without adenopathy, thyromegaly, or masses Cardiac: RRR, no bruits, no LE edema Lungs:  clear to auscultation, breath sounds equal bilaterally, no respiratory distress Abdomen: BS+, soft,  non-tender, non-distended, no masses or organomegaly noted Rectal: Deferred Musculoskeletal:  symmetrical muscle groups noted without atrophy or deformity Neuro:  gait normal; deep tendon reflexes normal and symmetric Psych: well oriented with normal range of affect and appropriate judgment/insight  Assessment and Plan  Well adult exam - Plan: CBC, Comprehensive metabolic panel, Lipid panel  Screening for prostate cancer - Plan: PSA  Encounter for long-term (current) use of high-risk medication - Plan: Drug Monitoring Panel [phone] , Urine   Well 67 y.o. male. Counseled on diet and exercise. Counseled on risks and benefits of prostate cancer screening with PSA. The patient agrees to undergo testing. UDS/CSC updated today.  Advanced directive form provided today.  Immunizations, labs, and further orders as above. Follow up in 6 mo. The patient voiced understanding and agreement to the plan.  Jilda Roche Wilson, DO 12/12/22 11:00 AM

## 2022-12-12 NOTE — Patient Instructions (Addendum)
Give [place] 2-3 business days to get the results of your labs back.   Keep the diet clean and stay active.  Let us know if you need anything.  Hip Exercises It is normal to feel mild stretching, pulling, tightness, or discomfort as you do these exercises, but you should stop right away if you feel sudden pain or your pain gets worse.   STRETCHING AND RANGE OF MOTION EXERCISES These exercises warm up your muscles and joints and improve the movement and flexibility of your hip. These exercises also help to relieve pain, numbness, and tingling. Exercise A: Hamstrings, Supine  Lie on your back. Loop a belt or towel over the ball of your left / right foot. The ball of your foot is on the walking surface, right under your toes. Straighten your left / right knee and slowly pull on the belt to raise your leg. Do not let your left / right knee bend while you do this. Keep your other leg flat on the floor. Raise the left / right leg until you feel a gentle stretch behind your left / right knee or thigh. Hold this position for 30 seconds. Slowly return your leg to the starting position. Repeat2 times. Complete this stretch 3 times per week. Exercise B: Hip Rotators  Lie on your back on a firm surface. Hold your left / right knee with your left / right hand. Hold your ankle with your other hand. Gently pull your left / right knee and rotate your lower leg toward your other shoulder. Pull until you feel a stretch in your buttocks. Keep your hips and shoulders firmly planted while you do this stretch. Hold this position for 30 seconds. Repeat 2 times. Complete this stretch 3 times per week. Exercise C: V-Sit (Hamstrings and Adductors)  Sit on the floor with your legs extended in a large "V" shape. Keep your knees straight during this exercise. Start with your head and chest upright, then bend at your waist to reach for your left foot (position A). You should feel a stretch in your right inner  thigh. Hold this position for 30 seconds. Then slowly return to the upright position. Bend at your waist to reach forward (position B). You should feel a stretch behind both of your thighs and knees. Hold this position for 30 seconds. Then slowly return to the upright position. Bend at your waist to reach for your right foot (position C). You should feel a stretch in your left inner thigh. Hold this position for 30 seconds. Then slowly return to the upright position. Repeat A, B, and C 2 times each. Complete this stretch 3 times per week. Exercise D: Lunge (Hip Flexors)  Place your left / right knee on the floor and bend your other knee so that is directly over your ankle. You should be half-kneeling. Keep good posture with your head over your shoulders. Tighten your buttocks to point your tailbone downward. This helps your back to keep from arching too much. You should feel a gentle stretch in the front of your left / right thigh and hip. If you do not feel any resistance, slightly slide your other foot forward and then slowly lunge forward so your knee once again lines up over your ankle. Make sure your tailbone continues to point downward. Hold this position for 30 seconds. Repeat 2 times. Complete this stretch 3 times per week.  STRENGTHENING EXERCISES These exercises build strength and endurance in your hip. Endurance is the ability  to use your muscles for a long time, even after they get tired. Exercise E: Bridge (Hip Extensors)  Lie on your back on a firm surface with your knees bent and your feet flat on the floor. Tighten your buttocks muscles and lift your bottom off the floor until the trunk of your body is level with your thighs. Do not arch your back. You should feel the muscles working in your buttocks and the back of your thighs. If you do not feel these muscles, slide your feet 1-2 inches (2.5-5 cm) farther away from your buttocks. Hold this position for 3 seconds. Slowly  lower your hips to the starting position. Repeat for a total of 10 repetitions. Let your muscles relax completely between repetitions. If this exercise is too easy, try doing it with your arms crossed over your chest. Repeat 2 times. Complete this exercise 3 times per week. Exercise F: Straight Leg Raises - Hip Abductors  Lie on your side with your left / right leg in the top position. Lie so your head, shoulder, knee, and hip line up with each other. You may bend your bottom knee to help you balance. Roll your hips slightly forward, so your hips are stacked directly over each other and your left / right knee is facing forward. Leading with your heel, lift your top leg 4-6 inches (10-15 cm). You should feel the muscles in your outer hip lifting. Do not let your foot drift forward. Do not let your knee roll toward the ceiling. Hold this position for 1 second. Slowly return to the starting position. Let your muscles relax completely between repetitions. Repeat for a total of 10 repetitions.  Repeat 2 times. Complete this exercise 3 times per week. Exercise G: Straight Leg Raises - Hip Adductors  Lie on your side with your left / right leg in the bottom position. Lie so your head, shoulder, knee, and hip line up. You may place your upper foot in front to help you balance. Roll your hips slightly forward, so your hips are stacked directly over each other and your left / right knee is facing forward. Tense the muscles in your inner thigh and lift your bottom leg 4-6 inches (10-15 cm). Hold this position for 1 second. Slowly return to the starting position. Let your muscles relax completely between repetitions. Repeat for a total of 10 repetitions. Repeat 2 times. Complete this exercise 3 times per week. Exercise H: Straight Leg Raises - Quadriceps  Lie on your back with your left / right leg extended and your other knee bent. Tense the muscles in the front of your left / right thigh. When you  do this, you should see your kneecap slide up or see increased dimpling just above your knee. Tighten these muscles even more and raise your leg 4-6 inches (10-15 cm) off the floor. Hold this position for 3 seconds. Keep these muscles tense as you lower your leg. Relax the muscles slowly and completely between repetitions. Repeat for a total of 10 repetitions. Repeat 2 times. Complete this exercise 3 times per week. Exercise I: Hip Abductors, Standing Tie one end of a rubber exercise band or tubing to a secure surface, such as a table or pole. Loop the other end of the band or tubing around your left / right ankle. Keeping your ankle with the band or tubing directly opposite of the secured end, step away until there is tension in the tubing or band. Hold onto a chair as  needed for balance. Lift your left / right leg out to your side. While you do this: Keep your back upright. Keep your shoulders over your hips. Keep your toes pointing forward. Make sure to use your hip muscles to lift your leg. Do not "throw" your leg or tip your body to lift your leg. Hold this position for 1 second. Slowly return to the starting position. Repeat for a total of 10 repetitions. Repeat 2 times. Complete this exercise 3 times per week. Exercise J: Squats (Quadriceps) Stand in a door frame so your feet and knees are in line with the frame. You may place your hands on the frame for balance. Slowly bend your knees and lower your hips like you are going to sit in a chair. Keep your lower legs in a straight-up-and-down position. Do not let your hips go lower than your knees. Do not bend your knees lower than told by your health care provider. If your hip pain increases, do not bend as low. Hold this position for 1 second. Slowly push with your legs to return to standing. Do not use your hands to pull yourself to standing. Repeat for a total of 10 repetitions. Repeat 2 times. Complete this exercise 3 times per  week. Make sure you discuss any questions you have with your health care provider. Document Released: 05/26/2005 Document Revised: 01/31/2016 Document Reviewed: 05/03/2015 Elsevier Interactive Patient Education  [employer].

## 2022-12-13 LAB — DRUG MONITORING PANEL 376104, URINE
Amphetamines: NEGATIVE ng/mL (ref <500)
Barbiturates: NEGATIVE ng/mL (ref <300)
Benzodiazepines: NEGATIVE ng/mL (ref <100)
Cocaine Metabolite: NEGATIVE ng/mL (ref <150)
Desmethyltramadol: NEGATIVE ng/mL (ref <100)
Opiates: NEGATIVE ng/mL (ref <100)
Oxycodone: NEGATIVE ng/mL (ref <100)
Tramadol: NEGATIVE ng/mL (ref <100)

## 2022-12-13 LAB — DM TEMPLATE

## 2022-12-15 ENCOUNTER — Ambulatory Visit: Payer: BC Managed Care – PPO | Attending: Internal Medicine | Admitting: Internal Medicine

## 2022-12-15 ENCOUNTER — Encounter: Payer: Self-pay | Admitting: Internal Medicine

## 2022-12-15 VITALS — BP 140/80 | HR 81 | Ht 73.0 in | Wt 217.0 lb

## 2022-12-15 DIAGNOSIS — I10 Essential (primary) hypertension: Secondary | ICD-10-CM | POA: Diagnosis not present

## 2022-12-15 DIAGNOSIS — R0609 Other forms of dyspnea: Secondary | ICD-10-CM

## 2022-12-15 DIAGNOSIS — I4819 Other persistent atrial fibrillation: Secondary | ICD-10-CM | POA: Diagnosis not present

## 2022-12-15 DIAGNOSIS — I480 Paroxysmal atrial fibrillation: Secondary | ICD-10-CM

## 2022-12-15 DIAGNOSIS — E782 Mixed hyperlipidemia: Secondary | ICD-10-CM

## 2022-12-15 DIAGNOSIS — R072 Precordial pain: Secondary | ICD-10-CM

## 2022-12-15 DIAGNOSIS — Z79899 Other long term (current) drug therapy: Secondary | ICD-10-CM

## 2022-12-15 MED ORDER — APIXABAN 5 MG PO TABS: 5.0000 mg | ORAL_TABLET | Freq: Two times a day (BID) | ORAL | 0 refills | Status: DC

## 2022-12-15 MED ORDER — APIXABAN 5 MG PO TABS: 5.0000 mg | ORAL_TABLET | Freq: Two times a day (BID) | ORAL | 3 refills | Status: DC

## 2022-12-15 NOTE — Progress Notes (Unsigned)
Cardiology Office Note:    Date:  12/15/2022   ID:  Javier King, DOB 1955-07-07, MRN [id number]  PCP:  Sharlene Dory, DO  Cardiologist:  Parke Poisson, MD  Electrophysiologist:  None   Referring MD: Sharlene Dory*   Chief Complaint/Reason for Referral: Afib  History of Present Illness:    Javier King is a 67 y.o. male with a history of anxiety and depression, musculoskeletal pain, HTN, possible TIA, HLD, pericarditis who presents for follow up of incidentally noted atrial fibrillation.   At a prior visit, he continued on aspirin and metoprolol. We participated in shared decision making. He felt that his diagnosis of TIA was questionable, and therefore would not meet the threshold for anticoagulation for afib (would only score 1 point for hypertension). He preferred not to take anticoagulation at the time, however we did discuss that at age 50 he would meet criteria for [facility] and we would discuss it over serial follow up. Was feeling well overall. His daughter was expecting a baby.  Today, he present doing overall well. He is currently in atrial fibrillation and denies feeling any of the symptoms.  He feels it is surprising that he is in atrial fibrillation given feeling well during exercise and no interval symptoms since her last visit 10/26/2020.  We reviewed the EKG in detail together and compared it to the last one in the room.  Irregular rhythm with no detectable P wave.  He has lost significant weight by exercising and walking a lot more. He has also been trying the [facility] weight loss program. At the last visit he was 305 lbs and today is 216 lbs. I have congratulated him on his success with health improvement.  He is compliant with his medications. His blood pressure today is 138/74, though he reports having better readings while at the weight loss clinic.   We discussed possibly stopping the baby aspirin given overall low risk with no coronary artery  disease, questionable diagnosis of TIA.  We reviewed guidelines surrounding atrial fibrillation and aspirin use.  We discussed his cholesterol as well. At the end of 2022, his LDL was 57. 90 now.  He is not sure why the LDL may have gone up, his diet has changed, only for the better. Since his metabolism is is low per weight loss clinic, he tends to eat more protein. He doesn't eat red meats often.  He is doing extremely well with diet lifestyle modifications.  He reports that since losing weight, the lower extremity swelling has improved significantly.   He denies any palpitations, chest pain, shortness of breath, or peripheral edema. No lightheadedness, headaches, syncope, orthopnea, or PND.  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   Cancer (HCC) 6213,0865   skin cancer   DDD (degenerative disc disease), lumbar    Depression 01/14/2016   Edema of both lower extremities    Erectile dysfunction    Essential hypertension 01/14/2016   Hepatic steatosis    History of TIA (transient ischemic attack)    per pt/ unsure if had TIA    Hyperlipidemia    Lower back pain    Pericarditis 1986   Tubular adenoma of colon    02/17/10    Past Surgical History:  Procedure Laterality Date   COLONOSCOPY     OTHER SURGICAL HISTORY  2003,2004   Back surgery/ 2  laminectomies in lower back   TONSILLECTOMY      Current Medications: Current Meds  Medication Sig   lisinopril (ZESTRIL) 40 MG tablet Take 1 tablet (40 mg total) by mouth daily.   pantoprazole (PROTONIX) 40 MG tablet TAKE 1 TABLET DAILY BEFORE BREAKFAST   tadalafil (CIALIS) 20 MG tablet TAKE 1/2 TO 1 (ONE-HALF TO ONE) TABLET BY MOUTH EVERY OTHER DAY AS NEEDED FOR ERECTILE DYSFUNCTION   traZODone (DESYREL) 100 MG tablet TAKE 1 TABLET AT BEDTIME   zolpidem (AMBIEN) 5 MG tablet TAKE 1 TABLET AT BEDTIME ASNEEDED FOR SLEEP     Allergies:    Penicillins   Social History   Tobacco Use   Smoking status: Never   Smokeless tobacco: Never  Vaping Use   Vaping status: Never Used  Substance Use Topics   Alcohol use: Yes    Alcohol/week: 3.0 standard drinks of alcohol    Types: 1 Glasses of wine, 1 Cans of beer, 1 Shots of liquor per week    Comment: occassional   Drug use: No     Family History: The patient's family history includes Alzheimer's disease in his father; Cancer in his mother; Diabetes in his sister; Heart disease in his mother; Leukemia in his sister; Parkinsonism in his father; Thyroid disease in his mother. There is no history of Colon cancer or Colon polyps.  ROS:   Please see the history of present illness.   (+) Weight loss (exercise, walking) All other systems reviewed and are negative.  EKGs/Labs/Other Studies Reviewed:    The following studies were reviewed today:  IMPRESSIONS     1. Left ventricular ejection fraction, by estimation, is 60 to 65%. The  left ventricle has normal function. The left ventrical is not well  visualized to exclude regional wall motion abnormalities. Left ventricular  diastolic parameters were normal.   2. Right ventricular systolic function is normal. The right ventricular  size is normal. Tricuspid regurgitation signal is inadequate for assessing  PA pressure.   3. The mitral valve is normal in structure and function. no evidence of  mitral valve regurgitation. No evidence of mitral stenosis.   4. The aortic valve was not well visualized. Aortic valve regurgitation  is not visualized. No aortic stenosis is present.   5. The inferior vena cava is normal in size with greater than 50%  respiratory variability, suggesting right atrial pressure of 3 mmHg.    EKG:  EKG is personally reviewed. 06/06/2022: A fib. Rate 72 bpm. Poor R wave progression.  10/26/2020: NSR   Recent Labs: 12/12/2022: ALT 23; BUN 17; Creatinine, Ser 0.98; Hemoglobin 13.7; Platelets 181.0;  Potassium 4.8; Sodium 134  Recent Lipid Panel    Component Value Date/Time   CHOL 149 12/12/2022 1112   CHOL 149 03/30/2022 1620   TRIG 65.0 12/12/2022 1112   HDL 50.70 12/12/2022 1112   HDL 44 03/30/2022 1620   CHOLHDL 3 12/12/2022 1112   VLDL 13.0 12/12/2022 1112   LDLCALC 85 12/12/2022 1112   LDLCALC 90 03/30/2022 1620   LDLCALC 134 (H) 03/04/2020 0759   LDLDIRECT 84.0 10/05/2020 0827    Physical Exam:    VS:  BP (!) 140/80   Pulse 81   Ht 6\' 1"  (1.854 m)   Wt 217 lb (98.4 kg)   SpO2 100%   BMI 28.63 kg/m     Wt Readings from Last 5 Encounters:  12/15/22 217 lb (98.4 kg)  12/12/22 215 lb 6 oz (97.7 kg)  09/21/22  203 lb (92.1 kg)  08/22/22 203 lb (92.1 kg)  07/26/22 205 lb (93 kg)    Constitutional: No acute distress Eyes: sclera non-icteric, normal conjunctiva and lids ENMT: normal dentition, moist mucous membranes Cardiovascular: irregular rhythm, normal rate, no murmurs. S1 and S2 normal. No jugular venous distention.  Respiratory: clear to auscultation bilaterally GI : normal bowel sounds, soft and nontender. No distention.   MSK: extremities warm, well perfused. No edema.  NEURO: grossly nonfocal exam, moves all extremities. PSYCH: alert and oriented x 3, normal mood and affect.   ASSESSMENT:    1. Paroxysmal atrial fibrillation (HCC)     PLAN:    Essential hypertension - Plan: EKG 12-Lead - overall well controlled lisinopril in monotherapy.  Continue at current dose and refill today. Improved with weight loss.  Precordial pain Dyspnea on exertion -No recurrence recently, continue to observe.  Normal coronary CTA. With that, can consider stopping ASA. Hx of TIA that patient does not truly feel was a TIA. He will consider and we participated in shared decision making.  Paroxysmal atrial fibrillation (HCC) -Stage IIIa atrial fibrillation -In rate controlled atrial fibrillation today.  Discussed with the patient today that his CHADS2 Vasc score will be  2 later this year, and we discussed stroke risk reduction at follow up. Likely will start anticoagulation, given afib on EKG today. He is asymptomatic. I have encouraged him to obtain wearable technology such as [employer]. If he remains persistently in atrial fibrillation we will quantify and consider cardioversion/early referral to EP for ablation review.  Mixed hyperlipidemia -LDL not at goal currently, will address with diet. Otherwise labs optimized.   Total time of encounter: 30 minutes total time of encounter, including 25 minutes spent in face-to-face patient care on the date of this encounter. This time includes coordination of care and counseling regarding above mentioned problem list. Remainder of non-face-to-face time involved reviewing chart documents/testing relevant to the patient encounter and documentation in the medical record. I have independently reviewed documentation from referring provider.   Weston Brass, MD, [facility]  [facility]   Medication Adjustments/Labs and Tests Ordered: Current medicines are reviewed at length with the patient today.  Concerns regarding medicines are outlined above.   Orders Placed This Encounter  Procedures   EKG 12-Lead   No orders of the defined types were placed in this encounter.  There are no Patient Instructions on file for this visit.

## 2022-12-15 NOTE — Patient Instructions (Signed)
Medication Instructions:  Your physician has recommended you make the following change in your medication:   -Start taking apixaban (Eliquis) 5mg  twice daily.  *If you need a refill on your cardiac medications before your next appointment, please call your pharmacy*    Follow-Up: At [facility], you and your health needs are our priority.  As part of our continuing mission to provide you with exceptional heart care, we have created designated Provider Care Teams.  These Care Teams include your primary Cardiologist (physician) and Advanced Practice Providers (APPs -  Physician Assistants and Nurse Practitioners) who all work together to provide you with the care you need, when you need it.  We recommend signing up for the patient portal called "MyChart".  Sign up information is provided on this After Visit Summary.  MyChart is used to connect with patients for Virtual Visits (Telemedicine).  Patients are able to view lab/test results, encounter notes, upcoming appointments, etc.  Non-urgent messages can be sent to your provider as well.   To learn more about what you can do with MyChart, go to [email or web address].    Your next appointment:   6 month(s)  Provider:   Parke Poisson, MD

## 2023-01-06 ENCOUNTER — Other Ambulatory Visit: Payer: Self-pay | Admitting: Family Medicine

## 2023-01-24 ENCOUNTER — Other Ambulatory Visit: Payer: BC Managed Care – PPO

## 2023-01-26 ENCOUNTER — Institutional Professional Consult (permissible substitution): Payer: BC Managed Care – PPO | Admitting: Cardiology

## 2023-01-26 ENCOUNTER — Encounter: Payer: Self-pay | Admitting: Family Medicine

## 2023-01-29 ENCOUNTER — Other Ambulatory Visit: Payer: Self-pay | Admitting: Family Medicine

## 2023-01-29 DIAGNOSIS — G4700 Insomnia, unspecified: Secondary | ICD-10-CM

## 2023-01-29 MED ORDER — LISINOPRIL 40 MG PO TABS: 40.0000 mg | ORAL_TABLET | Freq: Every day | ORAL | 3 refills | Status: DC

## 2023-01-29 MED ORDER — PANTOPRAZOLE SODIUM 40 MG PO TBEC: 40.0000 mg | DELAYED_RELEASE_TABLET | Freq: Every day | ORAL | 3 refills | Status: DC

## 2023-01-29 MED ORDER — APIXABAN 5 MG PO TABS: 5.0000 mg | ORAL_TABLET | Freq: Two times a day (BID) | ORAL | 0 refills | Status: DC

## 2023-01-29 MED ORDER — TRAZODONE HCL 100 MG PO TABS
100.0000 mg | ORAL_TABLET | Freq: Every day | ORAL | 1 refills | Status: DC
Start: 2023-01-29 — End: 2023-04-10

## 2023-01-29 MED ORDER — ZOLPIDEM TARTRATE 5 MG PO TABS
ORAL_TABLET | ORAL | 1 refills | Status: DC
Start: 2023-01-29 — End: 2023-04-10

## 2023-01-31 ENCOUNTER — Other Ambulatory Visit: Payer: BC Managed Care – PPO

## 2023-02-08 ENCOUNTER — Other Ambulatory Visit: Payer: Self-pay | Admitting: Family Medicine

## 2023-02-13 ENCOUNTER — Other Ambulatory Visit (HOSPITAL_BASED_OUTPATIENT_CLINIC_OR_DEPARTMENT_OTHER): Payer: Self-pay

## 2023-02-13 MED ORDER — INFLUENZA VAC A&B SURF ANT ADJ 0.5 ML IM SUSY
0.5000 mL | PREFILLED_SYRINGE | Freq: Once | INTRAMUSCULAR | 0 refills | Status: AC
  Filled 2023-02-13: qty 0.5, 1d supply, fill #0

## 2023-02-13 MED ORDER — COVID-19 MRNA VAC-TRIS(PFIZER) 30 MCG/0.3ML IM SUSY
0.3000 mL | PREFILLED_SYRINGE | Freq: Once | INTRAMUSCULAR | 0 refills | Status: AC
  Filled 2023-02-13: qty 0.3, 1d supply, fill #0

## 2023-03-07 ENCOUNTER — Encounter: Payer: Self-pay | Admitting: Family Medicine

## 2023-03-07 ENCOUNTER — Other Ambulatory Visit: Payer: Self-pay | Admitting: Family Medicine

## 2023-03-30 ENCOUNTER — Encounter: Payer: Self-pay | Admitting: Internal Medicine

## 2023-03-30 DIAGNOSIS — I4819 Other persistent atrial fibrillation: Secondary | ICD-10-CM

## 2023-03-30 DIAGNOSIS — I10 Essential (primary) hypertension: Secondary | ICD-10-CM

## 2023-04-03 ENCOUNTER — Ambulatory Visit (HOSPITAL_COMMUNITY): Payer: Medicare Other | Attending: Cardiology

## 2023-04-03 DIAGNOSIS — I10 Essential (primary) hypertension: Secondary | ICD-10-CM | POA: Diagnosis present

## 2023-04-03 DIAGNOSIS — I4819 Other persistent atrial fibrillation: Secondary | ICD-10-CM | POA: Diagnosis present

## 2023-04-04 LAB — ECHOCARDIOGRAM COMPLETE
Calc EF: 52.1 %
MV M vel: 4.88 m/s
MV Peak grad: 95.3 mm[Hg]
Radius: 0.4 cm
S' Lateral: 3.3 cm
Single Plane A2C EF: 55.7 %
Single Plane A4C EF: 49.3 %

## 2023-04-10 ENCOUNTER — Encounter: Payer: Self-pay | Admitting: Family Medicine

## 2023-04-10 ENCOUNTER — Ambulatory Visit (INDEPENDENT_AMBULATORY_CARE_PROVIDER_SITE_OTHER): Payer: BLUE CROSS/BLUE SHIELD | Admitting: Family Medicine

## 2023-04-10 VITALS — BP 136/82 | HR 84 | Temp 98.0°F | Resp 16 | Ht 73.0 in | Wt 213.0 lb

## 2023-04-10 DIAGNOSIS — G4700 Insomnia, unspecified: Secondary | ICD-10-CM | POA: Diagnosis not present

## 2023-04-10 DIAGNOSIS — R972 Elevated prostate specific antigen [PSA]: Secondary | ICD-10-CM

## 2023-04-10 DIAGNOSIS — I10 Essential (primary) hypertension: Secondary | ICD-10-CM

## 2023-04-10 MED ORDER — LISINOPRIL 40 MG PO TABS: 40.0000 mg | ORAL_TABLET | Freq: Every day | ORAL | 3 refills | Status: DC

## 2023-04-10 MED ORDER — TRAZODONE HCL 100 MG PO TABS
100.0000 mg | ORAL_TABLET | Freq: Every day | ORAL | 1 refills | Status: DC
Start: 2023-04-10 — End: 2023-10-09

## 2023-04-10 MED ORDER — PANTOPRAZOLE SODIUM 40 MG PO TBEC: 40.0000 mg | DELAYED_RELEASE_TABLET | Freq: Every day | ORAL | 3 refills | Status: DC

## 2023-04-10 MED ORDER — ZOLPIDEM TARTRATE 5 MG PO TABS
ORAL_TABLET | ORAL | 1 refills | Status: DC
Start: 2023-04-10 — End: 2023-10-09

## 2023-04-10 MED ORDER — APIXABAN 5 MG PO TABS: 5.0000 mg | ORAL_TABLET | Freq: Two times a day (BID) | ORAL | 1 refills | Status: DC

## 2023-04-10 NOTE — Patient Instructions (Signed)
Give us 2-3 business days to get the results of your labs back.   Keep the diet clean and stay active.  Let us know if you need anything. 

## 2023-04-10 NOTE — Progress Notes (Signed)
Chief Complaint  Patient presents with   Prostate    Discuss prostate results    Subjective Javier King is a 67 y.o. male who presents for hypertension follow up. He does not monitor home blood pressures. He is compliant with medication- lisinopril 40 mg/d. Patient has these side effects of medication: none He is adhering to a healthy diet overall. Current exercise: yoga, walking No CP or SOB.   Anxiety/depression Patient currently on trazodone 100 mg nightly, Ambien 5 mg nightly as needed.  He goes in phases where he uses Ambien, very sparingly currently.  He has no adverse effects with the trazodone and reports compliance.  He is not following with a therapist right now.  No homicidal or suicidal ideation.  No self-medication.   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   Cancer (HCC) 2025,4270   skin cancer   DDD (degenerative disc disease), lumbar    Depression 01/14/2016   Edema of both lower extremities    Erectile dysfunction    Essential hypertension 01/14/2016   Hepatic steatosis    History of TIA (transient ischemic attack)    per pt/ unsure if had TIA    Hyperlipidemia    Lower back pain    Pericarditis 1986   Tubular adenoma of colon    02/17/10    Exam BP 136/82 (BP Location: Left Arm, Patient Position: Sitting, Cuff Size: Normal)   Pulse 84   Temp 98 F (36.7 C) (Oral)   Resp 16   Ht 6\' 1"  (1.854 m)   Wt 213 lb (96.6 kg)   SpO2 97%   BMI 28.10 kg/m  General:  well developed, well nourished, in no apparent distress Heart: RRR, no bruits, no LE edema Lungs: clear to auscultation, no accessory muscle use Psych: well oriented with normal range of affect and appropriate judgment/insight  Increased prostate specific antigen (PSA) velocity - Plan: PSA, CANCELED: PSA, Medicare ( [place] only)  Insomnia, unspecified type - Plan: zolpidem  (AMBIEN) 5 MG tablet, traZODone (DESYREL) 100 MG tablet  Essential hypertension  Follow-up on this level today. Chronic, stable.  Continue Ambien as needed, trazodone 100 mg nightly. Chronic, stable.  Continue lisinopril 40 mg daily.  Counseled on diet and exercise. F/u in 6 mo. The patient voiced understanding and agreement to the plan.  Jilda Roche [place], DO 04/10/23  4:55 PM

## 2023-04-11 ENCOUNTER — Other Ambulatory Visit: Payer: Self-pay | Admitting: Family Medicine

## 2023-04-11 DIAGNOSIS — R972 Elevated prostate specific antigen [PSA]: Secondary | ICD-10-CM

## 2023-04-11 LAB — PSA: PSA: 5.86 ng/mL — ABNORMAL HIGH (ref 0.10–4.00)

## 2023-04-24 ENCOUNTER — Encounter: Payer: Self-pay | Admitting: Cardiology

## 2023-04-24 ENCOUNTER — Ambulatory Visit: Payer: Medicare Other | Attending: Cardiology | Admitting: Cardiology

## 2023-04-24 VITALS — BP 130/68 | HR 50 | Ht 73.0 in | Wt 221.2 lb

## 2023-04-24 DIAGNOSIS — I10 Essential (primary) hypertension: Secondary | ICD-10-CM | POA: Insufficient documentation

## 2023-04-24 DIAGNOSIS — D6869 Other thrombophilia: Secondary | ICD-10-CM | POA: Insufficient documentation

## 2023-04-24 DIAGNOSIS — I4819 Other persistent atrial fibrillation: Secondary | ICD-10-CM | POA: Insufficient documentation

## 2023-04-24 NOTE — Progress Notes (Signed)
Electrophysiology Office Note:   Date:  04/24/2023  ID:  Javier King, DOB 07-08-1955, MRN [id number]  Primary Cardiologist: Parke Poisson, MD Electrophysiologist: Regan Lemming, MD      History of Present Illness:   Javier King is a 67 y.o. male with h/o anxiety, depression, hypertension, possible TIA, hyperlipidemia, pericarditis, atrial fibrillation seen today for  for Electrophysiology evaluation of Atrial fibrillation at the request of Weston Brass.    He was found to be in atrial fibrillation by his primary cardiologist.  He was feeling well.  He was surprised to know that he was in atrial fibrillation at the time.  He has been exercising quite a bit.  His weight has gone from 305 to 216 pounds.  He is unaware of his atrial fibrillation.  He does not have fatigue or shortness of breath.  Despite this, he would like to get back into normal rhythm and would prefer a rhythm control strategy to rate control.  Review of systems complete and found to be negative unless listed in HPI.   EP Information / Studies Reviewed:    EKG is ordered today. Personal review as below.  EKG Interpretation Date/Time:  Tuesday April 24 2023 10:08:57 EST Ventricular Rate:  50 PR Interval:    QRS Duration:  96 QT Interval:  396 QTC Calculation: 361 R Axis:   86  Text Interpretation: Atrial fibrillation with slow ventricular response Septal infarct (cited on or before 15-Dec-2022) When compared with ECG of 15-Dec-2022 15:02, Vent. rate has decreased BY  31 BPM QT has shortened Confirmed by Koreen Lizaola ([id number]) on 04/24/2023 10:15:23 AM     Risk Assessment/Calculations:    CHA2DS2-VASc Score = 4   This indicates a 4.8% annual risk of stroke. The patient's score is based upon: CHF History: 0 HTN History: 1 Diabetes History: 0 Stroke History: 2 Vascular Disease History: 0 Age Score: 1 Gender Score: 0             Physical Exam:   VS:  BP 130/68 (BP Location: Left  Arm, Patient Position: Sitting, Cuff Size: Large)   Pulse (!) 50   Ht 6\' 1"  (1.854 m)   Wt 221 lb 3.2 oz (100.3 kg)   SpO2 98%   BMI 29.18 kg/m    Wt Readings from Last 3 Encounters:  04/24/23 221 lb 3.2 oz (100.3 kg)  04/10/23 213 lb (96.6 kg)  12/15/22 217 lb (98.4 kg)     GEN: Well nourished, well developed in no acute distress NECK: No JVD; No carotid bruits CARDIAC: Regular rate and rhythm, no murmurs, rubs, gallops RESPIRATORY:  Clear to auscultation without rales, wheezing or rhonchi  ABDOMEN: Soft, non-tender, non-distended EXTREMITIES:  No edema; No deformity   ASSESSMENT AND PLAN:    1.  Persistent atrial fibrillation: Currently on Eliquis.  He would benefit from rhythm control.  Due to his age, he would prefer to avoid medications.  Due to that, we Kimori Tartaglia plan for ablation.  Risks and benefits have been discussed.  He understands the risks and is agreed to the procedure.  Risk, benefits, and alternatives to EP study and radiofrequency/pulse field ablation for afib were also discussed in detail today. These risks include but are not limited to stroke, bleeding, vascular damage, tamponade, perforation, damage to the esophagus, lungs, and other structures, pulmonary vein stenosis, worsening renal function, and death. The patient understands these risk and wishes to proceed.  We Dashawn Bartnick therefore proceed with catheter ablation at the  next available time.  Carto, ICE, anesthesia are requested for the procedure.  Taneeka Curtner also obtain CT PV protocol prior to the procedure to exclude LAA thrombus and further evaluate atrial anatomy.  Case discussed with primary cardiology  2.  Secondary hypercoagulable state: Continue Eliquis for atrial fibrillation  3.  Hypertension: Currently well-controlled  Follow up with Dr. Elberta Fortis as usual post procedure  Signed, Airica Schwartzkopf Jorja Loa, MD

## 2023-04-24 NOTE — Patient Instructions (Signed)
Medication Instructions:  Your physician recommends that you continue on your current medications as directed. Please refer to the Current Medication list given to you today.  *If you need a refill on your cardiac medications before your next appointment, please call your pharmacy*   Lab Work: Pre procedure labs -- we will call you to schedule:  BMP & CBC  If you have a lab test that is abnormal and we need to change your treatment, we will call you to review the results -- otherwise no news is good news.    Testing/Procedures: Your physician has requested that you have cardiac CT 1 month PRIOR to your ablation. Cardiac computed tomography (CT) is a painless test that uses an x-ray machine to take clear, detailed pictures of your heart.  We will call you to schedule.  Your physician has recommended that you have an ablation. Catheter ablation is a medical procedure used to treat some cardiac arrhythmias (irregular heartbeats). During catheter ablation, a long, thin, flexible tube is put into a blood vessel in your groin (upper thigh), or neck. This tube is called an ablation catheter. It is then guided to your heart through the blood vessel. Radio frequency waves destroy small areas of heart tissue where abnormal heartbeats may cause an arrhythmia to start.   We will call you to schedule this procedure and review instructions.    Follow-Up: At [facility], you and your health needs are our priority.  As part of our continuing mission to provide you with exceptional heart care, we have created designated Provider Care Teams.  These Care Teams include your primary Cardiologist (physician) and Advanced Practice Providers (APPs -  Physician Assistants and Nurse Practitioners) who all work together to provide you with the care you need, when you need it.  Your next appointment:   1 month(s) after your ablation  The format for your next appointment:   In Person  Provider:   AFib  clinic   Thank you for choosing [facility]!!   Dory Horn, RN [phone]    Other Instructions   Cardiac Ablation Cardiac ablation is a procedure to destroy (ablate) some heart tissue that is sending bad signals. These bad signals cause problems in heart rhythm. The heart has many areas that make these signals. If there are problems in these areas, they can make the heart beat in a way that is not normal. Destroying some tissues can help make the heart rhythm normal. Tell your doctor about: Any allergies you have. All medicines you are taking. These include vitamins, herbs, eye drops, creams, and over-the-counter medicines. Any problems you or family members have had with medicines that make you fall asleep (anesthetics). Any blood disorders you have. Any surgeries you have had. Any medical conditions you have, such as kidney failure. Whether you are pregnant or may be pregnant. What are the risks? This is a safe procedure. But problems may occur, including: Infection. Bruising and bleeding. Bleeding into the chest. Stroke or blood clots. Damage to nearby areas of your body. Allergies to medicines or dyes. The need for a pacemaker if the normal system is damaged. Failure of the procedure to treat the problem. What happens before the procedure? Medicines Ask your doctor about: Changing or stopping your normal medicines. This is important. Taking aspirin and ibuprofen. Do not take these medicines unless your doctor tells you to take them. Taking other medicines, vitamins, herbs, and supplements. General instructions Follow instructions from your doctor about what you  cannot eat or drink. Plan to have someone take you home from the hospital or clinic. If you will be going home right after the procedure, plan to have someone with you for 24 hours. Ask your doctor what steps will be taken to prevent infection. What happens during the procedure?  An IV tube will be  put into one of your veins. You will be given a medicine to help you relax. The skin on your neck or groin will be numbed. A cut (incision) will be made in your neck or groin. A needle will be put through your cut and into a large vein. A tube (catheter) will be put into the needle. The tube will be moved to your heart. Dye may be put through the tube. This helps your doctor see your heart. Small devices (electrodes) on the tube will send out signals. A type of energy will be used to destroy some heart tissue. The tube will be taken out. Pressure will be held on your cut. This helps stop bleeding. A bandage will be put over your cut. The exact procedure may vary among doctors and hospitals. What happens after the procedure? You will be watched until you leave the hospital or clinic. This includes checking your heart rate, breathing rate, oxygen, and blood pressure. Your cut will be watched for bleeding. You will need to lie still for a few hours. Do not drive for 24 hours or as long as your doctor tells you. Summary Cardiac ablation is a procedure to destroy some heart tissue. This is done to treat heart rhythm problems. Tell your doctor about any medical conditions you may have. Tell him or her about all medicines you are taking to treat them. This is a safe procedure. But problems may occur. These include infection, bruising, bleeding, and damage to nearby areas of your body. Follow what your doctor tells you about food and drink. You may also be told to change or stop some of your medicines. After the procedure, do not drive for 24 hours or as long as your doctor tells you. This information is not intended to replace advice given to you by your health care provider. Make sure you discuss any questions you have with your health care provider. Document Revised: 07/29/2021 Document Reviewed: 04/10/2019 Elsevier Patient Education  2023 Elsevier Inc.   Cardiac Ablation, Care After  This  sheet gives you information about how to care for yourself after your procedure. Your health care provider may also give you more specific instructions. If you have problems or questions, contact your health care provider. What can I expect after the procedure? After the procedure, it is common to have: Bruising around your puncture site. Tenderness around your puncture site. Skipped heartbeats. If you had an atrial fibrillation ablation, you may have atrial fibrillation during the first several months after your procedure.  Tiredness (fatigue).  Follow these instructions at home: Puncture site care  Follow instructions from your health care provider about how to take care of your puncture site. Make sure you: If present, leave stitches (sutures), skin glue, or adhesive strips in place. These skin closures may need to stay in place for up to 2 weeks. If adhesive strip edges start to loosen and curl up, you may trim the loose edges. Do not remove adhesive strips completely unless your health care provider tells you to do that. If a large square bandage is present, this may be removed 24 hours after surgery.  Check your puncture  site every day for signs of infection. Check for: Redness, swelling, or pain. Fluid or blood. If your puncture site starts to bleed, lie down on your back, apply firm pressure to the area, and contact your health care provider. Warmth. Pus or a bad smell. A pea or small marble sized lump at the site is normal and can take up to three months to resolve.  Driving Do not drive for at least 4 days after your procedure or however long your health care provider recommends. (Do not resume driving if you have previously been instructed not to drive for other health reasons.) Do not drive or use heavy machinery while taking prescription pain medicine. Activity Avoid activities that take a lot of effort for at least 7 days after your procedure. Do not lift anything that is  heavier than 5 lb (4.5 kg) for one week.  No sexual activity for 1 week.  Return to your normal activities as told by your health care provider. Ask your health care provider what activities are safe for you. General instructions Take over-the-counter and prescription medicines only as told by your health care provider. Do not use any products that contain nicotine or tobacco, such as cigarettes and e-cigarettes. If you need help quitting, ask your health care provider. You may shower after 24 hours, but Do not take baths, swim, or use a hot tub for 1 week.  Do not drink alcohol for 24 hours after your procedure. Keep all follow-up visits as told by your health care provider. This is important. Contact a health care provider if: You have redness, mild swelling, or pain around your puncture site. You have fluid or blood coming from your puncture site that stops after applying firm pressure to the area. Your puncture site feels warm to the touch. You have pus or a bad smell coming from your puncture site. You have a fever. You have chest pain or discomfort that spreads to your neck, jaw, or arm. You have chest pain that is worse with lying on your back or taking a deep breath. You are sweating a lot. You feel nauseous. You have a fast or irregular heartbeat. You have shortness of breath. You are dizzy or light-headed and feel the need to lie down. You have pain or numbness in the arm or leg closest to your puncture site. Get help right away if: Your puncture site suddenly swells. Your puncture site is bleeding and the bleeding does not stop after applying firm pressure to the area. These symptoms may represent a serious problem that is an emergency. Do not wait to see if the symptoms will go away. Get medical help right away. Call your local emergency services (911 in the [place] Do not drive yourself to the hospital. Summary After the procedure, it is normal to have bruising and tenderness  at the puncture site in your groin, neck, or forearm. Check your puncture site every day for signs of infection. Get help right away if your puncture site is bleeding and the bleeding does not stop after applying firm pressure to the area. This is a medical emergency. This information is not intended to replace advice given to you by your health care provider. Make sure you discuss any questions you have with your health care provider.

## 2023-04-25 ENCOUNTER — Ambulatory Visit (INDEPENDENT_AMBULATORY_CARE_PROVIDER_SITE_OTHER): Payer: Medicare Other | Admitting: Family Medicine

## 2023-04-25 ENCOUNTER — Encounter: Payer: Self-pay | Admitting: Family Medicine

## 2023-04-25 ENCOUNTER — Other Ambulatory Visit (HOSPITAL_COMMUNITY)
Admission: RE | Admit: 2023-04-25 | Discharge: 2023-04-25 | Disposition: A | Discharge status: Home / Self Care | Payer: Medicare Other | Source: Ambulatory Visit | Attending: Family Medicine | Admitting: Family Medicine

## 2023-04-25 VITALS — BP 130/80 | HR 87 | Temp 98.0°F | Resp 16 | Ht 73.0 in | Wt 219.8 lb

## 2023-04-25 DIAGNOSIS — Z114 Encounter for screening for human immunodeficiency virus [HIV]: Secondary | ICD-10-CM | POA: Diagnosis not present

## 2023-04-25 DIAGNOSIS — Z113 Encounter for screening for infections with a predominantly sexual mode of transmission: Secondary | ICD-10-CM | POA: Diagnosis present

## 2023-04-25 DIAGNOSIS — Z23 Encounter for immunization: Secondary | ICD-10-CM | POA: Diagnosis not present

## 2023-04-25 NOTE — Progress Notes (Signed)
Chief Complaint  Patient presents with   Exposure to STD    Discuss STD testing    Subjective: Patient is a 67 y.o. male here for STI testing.  No new sexual partners. Has a new GF he would like to start being intimate with. No IV drug use or risky sexual behavior. No hx of STI's.   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   Cancer (HCC) [id number]   skin cancer   DDD (degenerative disc disease), lumbar    Depression 01/14/2016   Edema of both lower extremities    Erectile dysfunction    Essential hypertension 01/14/2016   Hepatic steatosis    History of TIA (transient ischemic attack)    per pt/ unsure if had TIA    Hyperlipidemia    Lower back pain    Pericarditis 1986   Tubular adenoma of colon    02/17/10    Objective: BP 130/80 (BP Location: Left Arm, Patient Position: Sitting, Cuff Size: Normal)   Pulse 87   Temp 98 F (36.7 C) (Oral)   Resp 16   Ht 6\' 1"  (1.854 m)   Wt 219 lb 12.8 oz (99.7 kg)   SpO2 97%   BMI 29.00 kg/m  General: Awake, appears stated age Heart: RRR, no LE edema Lungs: CTAB, no rales, wheezes or rhonchi. No accessory muscle use Abdomen: Bowel sounds present, soft, nontender, nondistended Psych: Age appropriate judgment and insight, normal affect and mood  Assessment and Plan: Screening for HIV without presence of risk factors - Plan: HIV Antibody (routine testing w rflx)  Screening examination for STI - Plan: Urine cytology ancillary only([facility])  Need for pneumococcal vaccination - Plan: Pneumococcal conjugate vaccine 20-valent (Prevnar 20)  Screening as above.  Practice safe sex. We had a discussion about the pneumonia vaccination as well.  PCV 20 today. The patient voiced understanding and agreement to the plan.  I spent 20 minutes with the patient discussing the above plans in addition to reviewing his chart on the same day  of the visit.  Jilda Roche Crosby, DO 04/25/23  9:06 AM

## 2023-04-25 NOTE — Patient Instructions (Addendum)
Practice safe sex.   Give [place] 4-5 business days to get the results of your labs back.   Let us know if you need anything.

## 2023-04-26 LAB — HIV ANTIBODY (ROUTINE TESTING W REFLEX): HIV 1&2 Ab, 4th Generation: NONREACTIVE

## 2023-04-27 ENCOUNTER — Ambulatory Visit: Payer: Medicare Other | Admitting: Family Medicine

## 2023-04-27 LAB — URINE CYTOLOGY ANCILLARY ONLY
Chlamydia: NEGATIVE
Comment: NEGATIVE
Comment: NEGATIVE
Comment: NORMAL
Neisseria Gonorrhea: NEGATIVE
Trichomonas: NEGATIVE

## 2023-05-02 NOTE — Progress Notes (Addendum)
Assessment: 1. Elevated PSA      Plan: Today had a long discussion with the patient regarding elevated PSA along with the issues and controversies regarding prostate cancer early detection.  We discussed options for further evaluation including additional laboratory testing as well as prostate imaging and biopsy.  Following our discussion, we will obtain ExoDx for further risk assessment. If low risk will recommend repeating PSA testing in a few months.  If high risk we will move forward with further evaluation with imaging and biopsy.   ADDENDUM 05/09/2023--  ExoDx returned low (11.6).  Will plan on repeat f/t psa in 3 months.  Chief Complaint: elevated psa  History of Present Illness:  Javier King is a 67 y.o. male with PMHx of anxiety and depression, musculoskeletal pain, HTN, possible TIA, HLD, pericarditis and afib on Eliquis who is seen in consultation from Sharlene Dory, DO for evaluation of elevated PSA.  NO family history of prostate cancer  LUTS- min; ipss=1  ED- none  PSA data: 09/2020  1.8 09/2021  1.8 11/2022  2.5 03/2023 5.86  Past Medical History:  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   Cancer (HCC) 0981,[phone]   skin cancer   DDD (degenerative disc disease), lumbar    Depression 01/14/2016   Edema of both lower extremities    Erectile dysfunction    Essential hypertension 01/14/2016   Hepatic steatosis    History of TIA (transient ischemic attack)    per pt/ unsure if had TIA    Hyperlipidemia    Lower back pain    Pericarditis 1986   Tubular adenoma of colon    02/17/10    Past Surgical History:  Past Surgical History:  Procedure Laterality Date   COLONOSCOPY     OTHER SURGICAL HISTORY  2003,2004   Back surgery/ 2 laminectomies in lower back   TONSILLECTOMY      Allergies:  Allergies  Allergen Reactions    Penicillins Itching    Family History:  Family History  Problem Relation Age of Onset   Heart disease Mother    Thyroid disease Mother    Cancer Mother    Alzheimer's disease Father    Parkinsonism Father    Diabetes Sister    Leukemia Sister    Colon cancer Neg Hx    Colon polyps Neg Hx     Social History:  Social History   Tobacco Use   Smoking status: Never   Smokeless tobacco: Never  Vaping Use   Vaping status: Never Used  Substance Use Topics   Alcohol use: Yes    Alcohol/week: 3.0 standard drinks of alcohol    Types: 1 Glasses of wine, 1 Cans of beer, 1 Shots of liquor per week    Comment: occassional   Drug use: No    Review of symptoms:  Constitutional:  Negative for unexplained weight loss, night sweats, fever, chills ENT:  Negative for nose bleeds, sinus pain, painful swallowing CV:  Negative for chest pain, shortness of breath, exercise intolerance, palpitations, loss of consciousness Resp:  Negative for cough, wheezing, shortness of breath GI:  Negative for nausea, vomiting, diarrhea, bloody stools GU:  Positives noted in HPI; otherwise negative for gross hematuria, dysuria, urinary incontinence Neuro:  Negative for seizures, poor balance, limb weakness, slurred speech Psych:  Negative for lack of energy, depression, anxiety Endocrine:  Negative  for polydipsia, polyuria, symptoms of hypoglycemia (dizziness, hunger, sweating) Hematologic:  Negative for anemia, purpura, petechia, prolonged or excessive bleeding, use of anticoagulants  Allergic:  Negative for difficulty breathing or choking as a result of exposure to anything; no shellfish allergy; no allergic response (rash/itch) to materials, foods  Physical exam: Vitals:   05/03/23 0900  BP: (!) 156/77  Pulse: 67    GENERAL APPEARANCE:  Well appearing, well developed, well nourished, NAD GU: Normal external genitalia DRE: Normal sphincter tone; prostate is approximately 40 g without evidence of  nodules or induration.  Results: UA clear

## 2023-05-03 ENCOUNTER — Ambulatory Visit (INDEPENDENT_AMBULATORY_CARE_PROVIDER_SITE_OTHER): Payer: Medicare Other | Admitting: Urology

## 2023-05-03 ENCOUNTER — Encounter: Payer: Self-pay | Admitting: Urology

## 2023-05-03 VITALS — BP 156/77 | HR 67 | Ht 73.0 in | Wt 212.0 lb

## 2023-05-03 DIAGNOSIS — R972 Elevated prostate specific antigen [PSA]: Secondary | ICD-10-CM | POA: Diagnosis not present

## 2023-05-04 LAB — URINALYSIS, ROUTINE W REFLEX MICROSCOPIC
Bilirubin, UA: NEGATIVE
Glucose, UA: NEGATIVE
Ketones, UA: NEGATIVE
Leukocytes,UA: NEGATIVE
Nitrite, UA: NEGATIVE
Protein,UA: NEGATIVE
RBC, UA: NEGATIVE
Specific Gravity, UA: 1.015 (ref 1.005–1.030)
Urobilinogen, Ur: 0.2 mg/dL (ref 0.2–1.0)
pH, UA: 7 (ref 5.0–7.5)

## 2023-05-09 ENCOUNTER — Encounter: Payer: Self-pay | Admitting: Urology

## 2023-05-09 NOTE — Addendum Note (Signed)
Addended by: Joline Maxcy on: 05/09/2023 03:36 PM   Modules accepted: Orders

## 2023-05-10 ENCOUNTER — Telehealth: Payer: Self-pay

## 2023-05-10 NOTE — Telephone Encounter (Signed)
-----   Message from Joline Maxcy sent at 05/09/2023  3:32 PM EST ----- Regarding: ExoDx results. Please call Javier King and let Javier King know that his ExoDx test returned low indicating a low risk of having significant prostate cancer.  We discussed that if it came back low we would repeat a psa in 3months. Please schedule for office visit with me in early March and have Javier King come in week before for free and total psa.  I placed the order.  Thanks

## 2023-05-10 NOTE — Telephone Encounter (Signed)
Left msg for a return call from patient regarding results.  

## 2023-05-11 NOTE — Telephone Encounter (Signed)
Pt aware of the results and the need for a 3 mo f/u. Appt scheduled and pt verbalized understanding of having labs done one week prior to appt.

## 2023-05-24 ENCOUNTER — Other Ambulatory Visit: Payer: Self-pay | Admitting: Internal Medicine

## 2023-06-08 ENCOUNTER — Telehealth: Payer: Self-pay | Admitting: *Deleted

## 2023-06-08 DIAGNOSIS — I4819 Other persistent atrial fibrillation: Secondary | ICD-10-CM

## 2023-06-08 NOTE — Telephone Encounter (Signed)
Pt returned my call. AFib ablation date held for 3/12. Aware office will be in touch to review instructions. Aware they may call to schedule the pre ablation CT prior to [place] calling to go over instructions. Patient verbalized understanding and agreeable to plan.

## 2023-06-08 NOTE — Telephone Encounter (Signed)
Left message to call back  

## 2023-06-08 NOTE — Telephone Encounter (Signed)
-----   Message from Nurse Jeris Penta sent at 06/04/2023  4:41 PM EST ----- Regarding: Ablation Hello Javier King,  I am new to [facility], and am unsure how the outpatient ablation process works.  I spoke to this pt a few minutes ago and he stated that he has been waiting for someone to call him regarding an upcoming ablation.  When you have time, can you please call him and update him?    I appreciate it, Candace Cruise, RN

## 2023-06-15 ENCOUNTER — Ambulatory Visit: Payer: BC Managed Care – PPO | Admitting: Family Medicine

## 2023-06-17 ENCOUNTER — Other Ambulatory Visit: Payer: Self-pay | Admitting: Family Medicine

## 2023-06-17 ENCOUNTER — Encounter: Payer: Self-pay | Admitting: Family Medicine

## 2023-06-18 ENCOUNTER — Other Ambulatory Visit: Payer: Self-pay

## 2023-06-18 MED ORDER — TADALAFIL 20 MG PO TABS: ORAL_TABLET | ORAL | 0 refills | Status: DC

## 2023-07-03 ENCOUNTER — Other Ambulatory Visit: Payer: Self-pay

## 2023-07-03 DIAGNOSIS — I4819 Other persistent atrial fibrillation: Secondary | ICD-10-CM

## 2023-07-04 ENCOUNTER — Telehealth (HOSPITAL_COMMUNITY): Payer: Self-pay

## 2023-07-04 NOTE — Telephone Encounter (Signed)
Spoke with patient to complete one month pre-procedure call.     New medical conditions?  NO Recent hospitalizations or surgeries? NO Started any new medications? NO Patient made aware to contact office to inform of any new medications started. Any changes in activities of daily living? NO  Pre-procedure testing scheduled: CT on 07/17/23 and lab work ordered. Confirmed patient is taking ELIQUIS and will continue taking medication before procedure or it may need to be rescheduled.  Confirmed patient is scheduled for Atrial Fibrillation Ablation on Wednesday, March 12 with Dr. Loman Brooklyn. Instructed patient to arrive at the Main Entrance A at [facility]: [address] [place], [place] [address] and check in at Admitting at 9:00 AM  Sometimes we have an availability that comes up earlier than your scheduled procedure date, are you be willing to come in earlier if needed? NO  Advised of plan to go home the same day and will only stay overnight if medically necessary. You MUST have a responsible adult to drive you home and MUST be with you the first 24 hours after you arrive home or your procedure could be cancelled.  Patient verbalized understanding to information provided and is agreeable to proceed with procedure.

## 2023-07-04 NOTE — Telephone Encounter (Signed)
Discussed w/ pt. He has received a 30 day free and understands he cannot get again. States it is too expensive.  Aware of the new Medicare guidelines this year.  Patient is going to reach out to discuss with them first.  He will let me know if he would like to go through the medication assistance, he will reach back out if still needed on  this matter. Pt agreeable to plan.

## 2023-07-05 ENCOUNTER — Encounter: Payer: Self-pay | Admitting: Internal Medicine

## 2023-07-05 DIAGNOSIS — I4819 Other persistent atrial fibrillation: Secondary | ICD-10-CM

## 2023-07-05 MED ORDER — APIXABAN 5 MG PO TABS: 5.0000 mg | ORAL_TABLET | Freq: Two times a day (BID) | ORAL | 1 refills | Status: DC

## 2023-07-05 NOTE — Telephone Encounter (Signed)
Prescription refill request for Eliquis received. Indication: Afib  Last office visit: 04/24/23 ([place])  Scr: 0.98 (12/12/22)  Age: 68 Weight: 96.2kg  Appropriate dose. Refill sent.

## 2023-07-06 LAB — CBC
Hematocrit: 42.6 % (ref 37.5–51.0)
Hemoglobin: 13.9 g/dL (ref 13.0–17.7)
MCH: 30.6 pg (ref 26.6–33.0)
MCHC: 32.6 g/dL (ref 31.5–35.7)
MCV: 94 fL (ref 79–97)
Platelets: 210 10*3/uL (ref 150–450)
RBC: 4.54 x10E6/uL (ref 4.14–5.80)
RDW: 12 % (ref 11.6–15.4)
WBC: 5.3 10*3/uL (ref 3.4–10.8)

## 2023-07-06 LAB — BASIC METABOLIC PANEL
BUN/Creatinine Ratio: 20 (ref 10–24)
BUN: 21 mg/dL (ref 8–27)
CO2: 25 mmol/L (ref 20–29)
Calcium: 10.4 mg/dL — ABNORMAL HIGH (ref 8.6–10.2)
Chloride: 99 mmol/L (ref 96–106)
Creatinine, Ser: 1.03 mg/dL (ref 0.76–1.27)
Glucose: 87 mg/dL (ref 70–99)
Potassium: 5.1 mmol/L (ref 3.5–5.2)
Sodium: 138 mmol/L (ref 134–144)
eGFR: 81 mL/min/{1.73_m2} (ref >59)

## 2023-07-17 ENCOUNTER — Ambulatory Visit (HOSPITAL_COMMUNITY)
Admission: RE | Admit: 2023-07-17 | Discharge: 2023-07-17 | Disposition: A | Discharge status: Home / Self Care | Payer: Medicare Other | Source: Ambulatory Visit | Attending: Cardiology | Admitting: Cardiology

## 2023-07-17 DIAGNOSIS — I4819 Other persistent atrial fibrillation: Secondary | ICD-10-CM | POA: Insufficient documentation

## 2023-07-17 MED ORDER — IOHEXOL 350 MG/ML SOLN
95.0000 mL | Freq: Once | INTRAVENOUS | Status: AC | PRN
Start: 2023-07-17 — End: 2023-07-17
  Administered 2023-07-17: 95 mL via INTRAVENOUS

## 2023-07-25 ENCOUNTER — Telehealth (HOSPITAL_COMMUNITY): Payer: Self-pay

## 2023-07-25 NOTE — Telephone Encounter (Signed)
 Call placed to patient to discuss upcoming procedure.   CT: completed and acceptable.  Labs: completed and acceptable.   Any recent signs of acute illness or been started on antibiotics? NO Any diabetic medications to hold? NO Any missed doses of blood thinner?  NO Advised patient to continue taking ANTICOAGULANT: Eliquis (Apixaban) without missing any doses.  Medication instructions:  On the morning of your procedure DO NOT take any medication., including Eliquis or the procedure may be rescheduled. Nothing to eat or drink after midnight prior to your procedure.  Confirmed patient is scheduled for Atrial Fibrillation Ablation on Wednesday, March 12 with Dr. Loman Brooklyn. Instructed patient to arrive at the Main Entrance A at [facility]: [address] [place], [place] [address] and check in at Admitting at 9:00 AM  Advised of plan to go home the same day and will only stay overnight if medically necessary. You MUST have a responsible adult to drive you home and MUST be with you the first 24 hours after you arrive home or your procedure could be cancelled.  Patient verbalized understanding to all instructions provided and agreed to proceed with procedure.

## 2023-07-25 NOTE — Telephone Encounter (Signed)
 Attempted to reach patient to discuss upcoming procedure, no answer. Left VM for patient to return call.

## 2023-07-26 ENCOUNTER — Encounter: Payer: Self-pay | Admitting: Emergency Medicine

## 2023-07-31 NOTE — Pre-Procedure Instructions (Signed)
 Instructed patient on the following items: Arrival time 0900 Nothing to eat or drink after midnight No meds AM of procedure Responsible person to drive you home and stay with you for 24 hrs  Have you missed any doses of anti-coagulant Eliquis- takes twice a day, hasn't missed any doses.  Don't take dose morning of procedure.

## 2023-08-01 ENCOUNTER — Other Ambulatory Visit: Payer: Self-pay

## 2023-08-01 ENCOUNTER — Ambulatory Visit (HOSPITAL_COMMUNITY): Payer: Self-pay | Admitting: Anesthesiology

## 2023-08-01 ENCOUNTER — Ambulatory Visit (HOSPITAL_COMMUNITY)
Admission: RE | Admit: 2023-08-01 | Discharge: 2023-08-01 | Disposition: A | Discharge status: Home / Self Care | Payer: Medicare Other | Source: Ambulatory Visit | Attending: Cardiology | Admitting: Cardiology

## 2023-08-01 ENCOUNTER — Ambulatory Visit (HOSPITAL_COMMUNITY)
Admission: RE | Disposition: A | Discharge status: Home / Self Care | Payer: Self-pay | Source: Ambulatory Visit | Attending: Cardiology

## 2023-08-01 ENCOUNTER — Ambulatory Visit (HOSPITAL_BASED_OUTPATIENT_CLINIC_OR_DEPARTMENT_OTHER): Payer: Self-pay | Admitting: Anesthesiology

## 2023-08-01 DIAGNOSIS — I4891 Unspecified atrial fibrillation: Secondary | ICD-10-CM

## 2023-08-01 DIAGNOSIS — I10 Essential (primary) hypertension: Secondary | ICD-10-CM | POA: Diagnosis not present

## 2023-08-01 DIAGNOSIS — M1990 Unspecified osteoarthritis, unspecified site: Secondary | ICD-10-CM | POA: Diagnosis not present

## 2023-08-01 DIAGNOSIS — Z8673 Personal history of transient ischemic attack (TIA), and cerebral infarction without residual deficits: Secondary | ICD-10-CM | POA: Diagnosis not present

## 2023-08-01 DIAGNOSIS — F418 Other specified anxiety disorders: Secondary | ICD-10-CM | POA: Diagnosis not present

## 2023-08-01 DIAGNOSIS — E785 Hyperlipidemia, unspecified: Secondary | ICD-10-CM | POA: Diagnosis not present

## 2023-08-01 DIAGNOSIS — I4819 Other persistent atrial fibrillation: Secondary | ICD-10-CM | POA: Insufficient documentation

## 2023-08-01 HISTORY — PX: ATRIAL FIBRILLATION ABLATION: EP1191

## 2023-08-01 SURGERY — ATRIAL FIBRILLATION ABLATION
Anesthesia: General

## 2023-08-01 MED ORDER — FENTANYL CITRATE (PF) 250 MCG/5ML IJ SOLN
INTRAMUSCULAR | Status: DC | PRN
  Administered 2023-08-01 (×2): 50 ug via INTRAVENOUS

## 2023-08-01 MED ORDER — HEPARIN (PORCINE) IN NACL 1000-0.9 UT/500ML-% IV SOLN
INTRAVENOUS | Status: DC | PRN
Start: 2023-08-01 — End: 2023-08-01
  Administered 2023-08-01 (×4): 500 mL

## 2023-08-01 MED ORDER — SODIUM CHLORIDE 0.9 % IV SOLN: INTRAVENOUS | Status: DC

## 2023-08-01 MED ORDER — LIDOCAINE 2% (20 MG/ML) 5 ML SYRINGE
INTRAMUSCULAR | Status: DC | PRN
  Administered 2023-08-01: 100 mg via INTRAVENOUS

## 2023-08-01 MED ORDER — ACETAMINOPHEN 325 MG PO TABS: 650.0000 mg | ORAL_TABLET | ORAL | Status: DC | PRN

## 2023-08-01 MED ORDER — SODIUM CHLORIDE 0.9 % IV SOLN: 250.0000 mL | INTRAVENOUS | Status: DC | PRN

## 2023-08-01 MED ORDER — ONDANSETRON HCL 4 MG/2ML IJ SOLN
INTRAMUSCULAR | Status: DC | PRN
  Administered 2023-08-01: 4 mg via INTRAVENOUS

## 2023-08-01 MED ORDER — ONDANSETRON HCL 4 MG/2ML IJ SOLN: 4.0000 mg | Freq: Four times a day (QID) | INTRAMUSCULAR | Status: DC | PRN

## 2023-08-01 MED ORDER — DEXAMETHASONE SODIUM PHOSPHATE 10 MG/ML IJ SOLN
INTRAMUSCULAR | Status: DC | PRN
  Administered 2023-08-01: 10 mg via INTRAVENOUS

## 2023-08-01 MED ORDER — PROTAMINE SULFATE 10 MG/ML IV SOLN
INTRAVENOUS | Status: DC | PRN
  Administered 2023-08-01: 40 mg via INTRAVENOUS

## 2023-08-01 MED ORDER — ROCURONIUM BROMIDE 10 MG/ML (PF) SYRINGE
PREFILLED_SYRINGE | INTRAVENOUS | Status: DC | PRN
  Administered 2023-08-01: 10 mg via INTRAVENOUS
  Administered 2023-08-01: 70 mg via INTRAVENOUS

## 2023-08-01 MED ORDER — PROPOFOL 10 MG/ML IV BOLUS
INTRAVENOUS | Status: DC | PRN
  Administered 2023-08-01: 150 mg via INTRAVENOUS
  Administered 2023-08-01: 50 mg via INTRAVENOUS

## 2023-08-01 MED ORDER — PHENYLEPHRINE HCL-NACL 20-0.9 MG/250ML-% IV SOLN
INTRAVENOUS | Status: DC | PRN
  Administered 2023-08-01: 50 ug/min via INTRAVENOUS

## 2023-08-01 MED ORDER — HEPARIN SODIUM (PORCINE) 1000 UNIT/ML IJ SOLN
INTRAMUSCULAR | Status: AC
Start: 2023-08-01 — End: ?
  Filled 2023-08-01: qty 10

## 2023-08-01 MED ORDER — SODIUM CHLORIDE 0.9% FLUSH: 3.0000 mL | INTRAVENOUS | Status: DC | PRN

## 2023-08-01 MED ORDER — ATROPINE SULFATE 1 MG/ML IV SOLN
INTRAVENOUS | Status: DC | PRN
Start: 2023-08-01 — End: 2023-08-01
  Administered 2023-08-01: 1 mg via INTRAVENOUS

## 2023-08-01 MED ORDER — FENTANYL CITRATE (PF) 100 MCG/2ML IJ SOLN
INTRAMUSCULAR | Status: AC
  Filled 2023-08-01: qty 2

## 2023-08-01 MED ORDER — SUGAMMADEX SODIUM 200 MG/2ML IV SOLN
INTRAVENOUS | Status: DC | PRN
  Administered 2023-08-01: 400 mg via INTRAVENOUS

## 2023-08-01 MED ORDER — LACTATED RINGERS IV SOLN: INTRAVENOUS | Status: DC | PRN

## 2023-08-01 MED ORDER — HEPARIN SODIUM (PORCINE) 1000 UNIT/ML IJ SOLN
INTRAMUSCULAR | Status: DC | PRN
  Administered 2023-08-01: 15000 [IU] via INTRAVENOUS
  Administered 2023-08-01: 9000 [IU] via INTRAVENOUS

## 2023-08-01 SURGICAL SUPPLY — 20 items
BLANKET WARM UNDERBOD FULL ACC (MISCELLANEOUS) ×1 IMPLANT
CABLE PFA RX CATH CONN (CABLE) IMPLANT
CATH FARAWAVE ABLATION 31 (CATHETERS) IMPLANT
CATH OCTARAY 2.0 F 3-3-3-3-3 (CATHETERS) IMPLANT
CATH SOUNDSTAR ECO 8FR (CATHETERS) IMPLANT
CATH WEBSTER BI DIR CS D-F CRV (CATHETERS) IMPLANT
CLOSURE MYNX CONTROL 6F/7F (Vascular Products) IMPLANT
CLOSURE PERCLOSE PROSTYLE (VASCULAR PRODUCTS) IMPLANT
COVER SWIFTLINK CONNECTOR (BAG) ×1 IMPLANT
DILATOR VESSEL 38 20CM 16FR (INTRODUCER) IMPLANT
GUIDEWIRE INQWIRE 1.5J.035X260 (WIRE) IMPLANT
INQWIRE 1.5J .035X260CM (WIRE) ×1 IMPLANT
KIT VERSACROSS CNCT FARADRIVE (KITS) IMPLANT
PACK EP LF (CUSTOM PROCEDURE TRAY) ×1 IMPLANT
PAD DEFIB RADIO PHYSIO CONN (PAD) ×1 IMPLANT
PATCH CARTO3 (PAD) IMPLANT
SHEATH FARADRIVE STEERABLE (SHEATH) IMPLANT
SHEATH PINNACLE 8F 10CM (SHEATH) IMPLANT
SHEATH PINNACLE 9F 10CM (SHEATH) IMPLANT
SHEATH PROBE COVER 6X72 (BAG) IMPLANT

## 2023-08-01 NOTE — Progress Notes (Signed)
 Patient and wife was given discharge instructions. Both verbalized understanding.

## 2023-08-01 NOTE — Discharge Instructions (Signed)

## 2023-08-01 NOTE — Anesthesia Preprocedure Evaluation (Addendum)
 Anesthesia Evaluation  Patient identified by MRN, date of birth, ID band Patient awake    Reviewed: Allergy & Precautions, H&P , NPO status , Patient's Chart, lab work & pertinent test results  Airway Mallampati: II  TM Distance: >3 FB Neck ROM: Full    Dental no notable dental hx. (+) Teeth Intact, Dental Advisory Given   Pulmonary neg pulmonary ROS, shortness of breath   Pulmonary exam normal breath sounds clear to auscultation       Cardiovascular Exercise Tolerance: Good hypertension, negative cardio ROS Normal cardiovascular exam Rhythm:Regular Rate:Normal  ECHO 11/24 1. Left ventricular ejection fraction, by estimation, is 55 to 60% with  beat to beat variability. The left ventricle has normal function. Left  ventricular endocardial border not optimally defined to evaluate regional  wall motion. Left ventricular  diastolic parameters are indeterminate.   2. Right ventricular systolic function is normal. The right ventricular  size is mildly enlarged. There is normal pulmonary artery systolic  pressure. The estimated right ventricular systolic pressure is 25.6 mmHg.   3. The mitral valve is normal in structure. Mild mitral valve  regurgitation. No evidence of mitral stenosis.   4. The aortic valve is grossly normal. Aortic valve regurgitation is  trivial. No aortic stenosis is present.   5. The inferior vena cava is normal in size with <50% respiratory  variability, suggesting right atrial pressure of 8 mmHg.      Neuro/Psych  PSYCHIATRIC DISORDERS Anxiety Depression    negative neurological ROS  negative psych ROS   GI/Hepatic negative GI ROS, Neg liver ROS,,,  Endo/Other    Class 4 obesityClass 3 obesity  Renal/GU negative Renal ROS  negative genitourinary   Musculoskeletal negative musculoskeletal ROS (+) Arthritis , Osteoarthritis,    Abdominal   Peds negative pediatric ROS (+)  Hematology negative  hematology ROS (+)   Anesthesia Other Findings   Reproductive/Obstetrics negative OB ROS                             Anesthesia Physical Anesthesia Plan  ASA: 3  Anesthesia Plan: General   Post-op Pain Management: Minimal or no pain anticipated   Induction: Intravenous  PONV Risk Score and Plan: 2 and Ondansetron and Dexamethasone  Airway Management Planned: Oral ETT  Additional Equipment:   Intra-op Plan:   Post-operative Plan: Extubation in OR  Informed Consent: I have reviewed the patients History and Physical, chart, labs and discussed the procedure including the risks, benefits and alternatives for the proposed anesthesia with the patient or authorized representative who has indicated his/her understanding and acceptance.       Plan Discussed with: Anesthesiologist and CRNA  Anesthesia Plan Comments: (  )        Anesthesia Quick Evaluation

## 2023-08-01 NOTE — H&P (Signed)
  Electrophysiology Office Note:   Date:  08/01/2023  ID:  Javier King, DOB Oct 05, 1955, MRN [id number]  Primary Cardiologist: Parke Poisson, MD Electrophysiologist: Regan Lemming, MD      History of Present Illness:   Javier King is a 68 y.o. male with h/o anxiety, depression, hypertension, possible TIA, hyperlipidemia, pericarditis, atrial fibrillation seen today for  for Electrophysiology evaluation of Atrial fibrillation at the request of Weston Brass.    Today, denies symptoms of palpitations, chest pain, shortness of breath, orthopnea, PND, lower extremity edema, claudication, dizziness, presyncope, syncope, bleeding, or neurologic sequela. The patient is tolerating medications without difficulties. Plan ablation today.   EP Information / Studies Reviewed:    EKG is ordered today. Personal review as below.        Risk Assessment/Calculations:    CHA2DS2-VASc Score = 4   This indicates a 4.8% annual risk of stroke. The patient's score is based upon: CHF History: 0 HTN History: 1 Diabetes History: 0 Stroke History: 2 Vascular Disease History: 0 Age Score: 1 Gender Score: 0            Physical Exam:   VS:  BP (!) 180/81   Pulse 61   Temp 97.6 F (36.4 C)   Resp 16   Ht 6\' 1"  (1.854 m)   Wt 99.8 kg   SpO2 99%   BMI 29.03 kg/m    Wt Readings from Last 3 Encounters:  08/01/23 99.8 kg  05/03/23 96.2 kg  04/25/23 99.7 kg    GEN: No acute distress.   Neck: No JVD Cardiac: RRR, no murmurs, rubs, or gallops.  Respiratory: decreased BS bases bilaterally. GI: Soft, nontender, non-distended  MS: No edema; No deformity. Neuro:  Nonfocal  Skin: warm and dry Psych: Normal affect    ASSESSMENT AND PLAN:    1.  Persistent atrial fibrillation: Javier King has presented today for surgery, with the diagnosis of AF.  The various methods of treatment have been discussed with the patient and family. After consideration of risks, benefits and  other options for treatment, the patient has consented to  Procedure(s): Catheter ablation as a surgical intervention .  Risks include but not limited to complete heart block, stroke, esophageal damage, nerve damage, bleeding, vascular damage, tamponade, perforation, MI, and death. The patient's history has been reviewed, patient examined, no change in status, stable for surgery.  I have reviewed the patient's chart and labs.  Questions were answered to the patient's satisfaction.    Javier Marlar Elberta Fortis, MD 08/01/2023 9:54 AM

## 2023-08-01 NOTE — Transfer of Care (Signed)
 Immediate Anesthesia Transfer of Care Note  Patient: Javier King  Procedure(s) Performed: ATRIAL FIBRILLATION ABLATION  Patient Location: PACU  Anesthesia Type:General  Level of Consciousness: awake, alert , oriented, and patient cooperative  Airway & Oxygen Therapy: Patient Spontanous Breathing and Patient connected to face mask oxygen  Post-op Assessment: Report given to RN, Post -op Vital signs reviewed and stable, Patient moving all extremities, Patient moving all extremities X 4, and Patient able to stick tongue midline  Post vital signs: Reviewed and stable  Last Vitals:  Vitals Value Taken Time  BP    Temp    Pulse    Resp    SpO2      Last Pain:  Vitals:   08/01/23 0924  PainSc: 0-No pain      Patients Stated Pain Goal: 5 (08/01/23 0924)  Complications: There were no known notable events for this encounter.

## 2023-08-01 NOTE — Anesthesia Procedure Notes (Signed)
 Procedure Name: Intubation Date/Time: 08/01/2023 10:43 AM  Performed by: Venia Carbon, CRNAPre-anesthesia Checklist: Patient identified, Emergency Drugs available, Suction available, Patient being monitored and Timeout performed Patient Re-evaluated:Patient Re-evaluated prior to induction Oxygen Delivery Method: Circle system utilized Preoxygenation: Pre-oxygenation with 100% oxygen Induction Type: IV induction Ventilation: Mask ventilation without difficulty Laryngoscope Size: Mac and 3 Grade View: Grade II Tube size: 7.0 mm Number of attempts: 1 Airway Equipment and Method: Stylet and Patient positioned with wedge pillow Placement Confirmation: ETT inserted through vocal cords under direct vision, positive ETCO2, CO2 detector and breath sounds checked- equal and bilateral Secured at: 24 cm Tube secured with: Tape

## 2023-08-01 NOTE — Anesthesia Postprocedure Evaluation (Signed)
 Anesthesia Post Note  Patient: Javier King  Procedure(s) Performed: ATRIAL FIBRILLATION ABLATION     Patient location during evaluation: PACU Anesthesia Type: General Level of consciousness: awake and alert Pain management: pain level controlled Vital Signs Assessment: post-procedure vital signs reviewed and stable Respiratory status: spontaneous breathing, nonlabored ventilation, respiratory function stable and patient connected to nasal cannula oxygen Cardiovascular status: blood pressure returned to baseline and stable Postop Assessment: no apparent nausea or vomiting Anesthetic complications: no   There were no known notable events for this encounter.  Last Vitals:  Vitals:   08/01/23 0924  BP: (!) 180/81  Pulse: 61  Resp: 16  Temp: 36.4 C  SpO2: 99%    Last Pain:  Vitals:   08/01/23 0924  PainSc: 0-No pain                 Kastiel Simonian

## 2023-08-02 ENCOUNTER — Telehealth (HOSPITAL_COMMUNITY): Payer: Self-pay

## 2023-08-02 ENCOUNTER — Telehealth: Payer: Self-pay | Admitting: Urology

## 2023-08-02 ENCOUNTER — Encounter (HOSPITAL_COMMUNITY): Payer: Self-pay | Admitting: Cardiology

## 2023-08-02 MED FILL — Fentanyl Citrate Preservative Free (PF) Inj 100 MCG/2ML: INTRAMUSCULAR | Qty: 2 | Status: AC

## 2023-08-02 NOTE — Telephone Encounter (Signed)
 Spoke with patient to complete post procedure follow up call.  Patient reports no complications with groin sites.   Instructions reviewed with patient:  Remove large bandage at puncture site after 24 hours. It is normal to have bruising, tenderness and a pea or marble sized lump/knot at the groin site which can take up to three months to resolve.  Get help right away if you notice sudden swelling at the puncture site.  Check your puncture site every day for signs of infection: fever, redness, swelling, pus drainage, warmth, foul odor or excessive pain. If this occurs, please call the office at [phone], to speak with the nurse. Get help right away if your puncture site is bleeding and the bleeding does not stop after applying firm pressure to the area.  You may continue to have skipped beats/ atrial fibrillation during the first several months after your procedure.  It is very important not to miss any doses of your blood thinner Eliquis. Patient restarted taking this medication on yesterday, 08/01/23.   You will follow up with the Afib clinic on 08/29/23 and follow up with the APP 11/01/23.   Patient verbalized understanding to all instructions provided.

## 2023-08-02 NOTE — Telephone Encounter (Signed)
 Called and lvm to r/s apt due to Dr Margo Aye being out of office ANN 08/02/23

## 2023-08-02 NOTE — Telephone Encounter (Signed)
 Attempted to reach patient to follow up with procedure completed on 08/01/23, no answer. Left VM for patient to return call.

## 2023-08-21 ENCOUNTER — Ambulatory Visit: Payer: Medicare Other | Admitting: Urology

## 2023-08-23 ENCOUNTER — Ambulatory Visit: Admitting: Urology

## 2023-08-29 ENCOUNTER — Ambulatory Visit (HOSPITAL_COMMUNITY)
Admission: RE | Admit: 2023-08-29 | Discharge: 2023-08-29 | Disposition: A | Discharge status: Home / Self Care | Source: Ambulatory Visit | Attending: Internal Medicine | Admitting: Internal Medicine

## 2023-08-29 VITALS — BP 164/84 | HR 55 | Ht 73.0 in | Wt 226.2 lb

## 2023-08-29 DIAGNOSIS — F32A Depression, unspecified: Secondary | ICD-10-CM | POA: Insufficient documentation

## 2023-08-29 DIAGNOSIS — I319 Disease of pericardium, unspecified: Secondary | ICD-10-CM | POA: Diagnosis not present

## 2023-08-29 DIAGNOSIS — I4891 Unspecified atrial fibrillation: Secondary | ICD-10-CM | POA: Diagnosis present

## 2023-08-29 DIAGNOSIS — Z7901 Long term (current) use of anticoagulants: Secondary | ICD-10-CM | POA: Insufficient documentation

## 2023-08-29 DIAGNOSIS — F419 Anxiety disorder, unspecified: Secondary | ICD-10-CM | POA: Diagnosis not present

## 2023-08-29 DIAGNOSIS — I4819 Other persistent atrial fibrillation: Secondary | ICD-10-CM | POA: Insufficient documentation

## 2023-08-29 DIAGNOSIS — I10 Essential (primary) hypertension: Secondary | ICD-10-CM | POA: Insufficient documentation

## 2023-08-29 DIAGNOSIS — D6869 Other thrombophilia: Secondary | ICD-10-CM | POA: Insufficient documentation

## 2023-08-29 DIAGNOSIS — E785 Hyperlipidemia, unspecified: Secondary | ICD-10-CM | POA: Insufficient documentation

## 2023-08-29 NOTE — Progress Notes (Signed)
 Primary Care Physician: Sharlene Dory, DO Primary Cardiologist: Parke Poisson, MD Electrophysiologist: Regan Lemming, MD     Referring Physician: Dr. Daine Floras Kluth is a 68 y.o. male with a history of anxiety, depression, HTN, possible TIA, HLD, pericarditis, and atrial fibrillation who presents for consultation in the [facility]. Patient is on Eliquis 5 mg BID for a CHADS2VASC score of 4.  On evaluation today, patient is currently in NSR. S/p Afib ablation on 08/01/23 by Dr. Elberta Fortis. No episodes of Afib since ablation. No chest pain or SOB. Leg sites healed without issue. No missed doses of anticoagulant.  Today, he denies symptoms of orthopnea, PND, lower extremity edema, dizziness, presyncope, syncope, snoring, daytime somnolence, bleeding, or neurologic sequela. The patient is tolerating medications without difficulties and is otherwise without complaint today.    he has a BMI of Body mass index is 29.84 kg/m.Marland Kitchen Filed Weights   08/29/23 1020  Weight: 102.6 kg    Current Outpatient Medications  Medication Sig Dispense Refill   apixaban (ELIQUIS) 5 MG TABS tablet Take 1 tablet (5 mg total) by mouth 2 (two) times daily. 180 tablet 1   Ibuprofen-diphenhydrAMINE HCl (ADVIL PM) 200-25 MG CAPS Take 2 tablets by mouth at bedtime as needed (sleep).     lisinopril (ZESTRIL) 40 MG tablet Take 1 tablet (40 mg total) by mouth daily. 90 tablet 3   Multiple Vitamin (MULTIVITAMIN WITH MINERALS) TABS tablet Take 1 tablet by mouth daily.     Omega-3 Fatty Acids (OMEGA 3 PO) Take 1 capsule by mouth daily.     pantoprazole (PROTONIX) 40 MG tablet Take 1 tablet (40 mg total) by mouth daily before breakfast. 90 tablet 3   tadalafil (CIALIS) 20 MG tablet TAKE 1/2 TO 1 (ONE-HALF TO ONE) TABLET BY MOUTH EVERY OTHER DAY AS NEEDED FOR ERECTILE DYSFUNCTION 30 tablet 0   traZODone (DESYREL) 100 MG tablet Take 1 tablet (100 mg total) by mouth  at bedtime. 90 tablet 1   zolpidem (AMBIEN) 5 MG tablet TAKE 1 TABLET AT BEDTIME ASNEEDED FOR SLEEP 90 tablet 1   No current facility-administered medications for this encounter.    Atrial Fibrillation Management history:  Previous antiarrhythmic drugs: none Previous cardioversions: none Previous ablations: 08/01/23 Anticoagulation history: Eliquis   ROS- All systems are reviewed and negative except as per the HPI above.  Physical Exam: BP (!) 164/84   Pulse (!) 55   Ht 6\' 1"  (1.854 m)   Wt 102.6 kg   BMI 29.84 kg/m   GEN: Well nourished, well developed in no acute distress NECK: No JVD; No carotid bruits CARDIAC: Regular rate and rhythm, no murmurs, rubs, gallops RESPIRATORY:  Clear to auscultation without rales, wheezing or rhonchi  ABDOMEN: Soft, non-tender, non-distended EXTREMITIES:  No edema; No deformity   EKG today demonstrates  Vent. rate 55 BPM PR interval 234 ms QRS duration 100 ms QT/QTcB 412/394 ms P-R-T axes * 76 34 Sinus bradycardia with 1st degree A-V block Septal infarct , age undetermined Abnormal ECG When compared with ECG of 01-Aug-2023 12:21, PREVIOUS ECG IS PRESENT  Echo 04/03/23 demonstrated   1. Left ventricular ejection fraction, by estimation, is 55 to 60% with  beat to beat variability. The left ventricle has normal function. Left  ventricular endocardial border not optimally defined to evaluate regional  wall motion. Left ventricular  diastolic parameters are indeterminate.   2. Right ventricular systolic function is normal. The  right ventricular  size is mildly enlarged. There is normal pulmonary artery systolic  pressure. The estimated right ventricular systolic pressure is 25.6 mmHg.   3. The mitral valve is normal in structure. Mild mitral valve  regurgitation. No evidence of mitral stenosis.   4. The aortic valve is grossly normal. Aortic valve regurgitation is  trivial. No aortic stenosis is present.   5. The inferior vena cava  is normal in size with <50% respiratory  variability, suggesting right atrial pressure of 8 mmHg.    ASSESSMENT & PLAN CHA2DS2-VASc Score = 4  The patient's score is based upon: CHF History: 0 HTN History: 1 Diabetes History: 0 Stroke History: 2 Vascular Disease History: 0 Age Score: 1 Gender Score: 0       ASSESSMENT AND PLAN: Persistent Atrial Fibrillation (ICD10:  I48.19) The patient's CHA2DS2-VASc score is 4, indicating a 4.8% annual risk of stroke.   S/p Afib ablation on 08/01/23 by Dr. Elberta Fortis.  He is currently in NSR. Continue Eliquis 5 mg BID without interruption.    Secondary Hypercoagulable State (ICD10:  D68.69) The patient is at significant risk for stroke/thromboembolism based upon his CHA2DS2-VASc Score of 4.  Continue Apixaban (Eliquis).   No missed doses.     Follow up with EP as scheduled.   Lake Bells, PA-C  Afib Clinic [facility] [address] [place], [place] [address] [phone]

## 2023-09-04 ENCOUNTER — Ambulatory Visit (INDEPENDENT_AMBULATORY_CARE_PROVIDER_SITE_OTHER): Admitting: Urology

## 2023-09-04 ENCOUNTER — Encounter: Payer: Self-pay | Admitting: Urology

## 2023-09-04 VITALS — BP 147/89 | HR 56

## 2023-09-04 DIAGNOSIS — R972 Elevated prostate specific antigen [PSA]: Secondary | ICD-10-CM | POA: Diagnosis not present

## 2023-09-04 LAB — MICROSCOPIC EXAMINATION: Bacteria, UA: NONE SEEN

## 2023-09-04 LAB — URINALYSIS, ROUTINE W REFLEX MICROSCOPIC
Bilirubin, UA: NEGATIVE
Glucose, UA: NEGATIVE
Ketones, UA: NEGATIVE
Leukocytes,UA: NEGATIVE
Nitrite, UA: NEGATIVE
Protein,UA: NEGATIVE
Specific Gravity, UA: 1.015 (ref 1.005–1.030)
Urobilinogen, Ur: 0.2 mg/dL (ref 0.2–1.0)
pH, UA: 7 (ref 5.0–7.5)

## 2023-09-04 NOTE — Progress Notes (Signed)
   Assessment: 1. Elevated PSA     Plan: Free and total psa today Will contact with results and further recommendations If psa concerning I discussed next step as a mp-prostate mri.  Chief Complaint: elevated psa   HPI: Javier King is a 68 y.o. male who presents for continued evaluation of elevated PSA. Please see my note 05/03/2019 for the time of initial visit for detailed history and exam.  Patient is here for interval follow-up.  Unfortunately he did not have his PSA drawn prior to visit today.  NO family history of prostate cancer   LUTS- min; ipss=1   ED- none   PSA data: 09/2020             1.8 09/2021             1.8 11/2022             2.5 03/2023 5.86 04/2023   ExoDx returned low (11.6).    Portions of the above documentation were copied from a prior visit for review purposes only.  Allergies: Allergies  Allergen Reactions   Penicillins Itching    PMH: 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   Cancer (HCC) [phone],9604   skin cancer   DDD (degenerative disc disease), lumbar    Depression 01/14/2016   Edema of both lower extremities    Erectile dysfunction    Essential hypertension 01/14/2016   Hepatic steatosis    History of TIA (transient ischemic attack)    per pt/ unsure if had TIA    Hyperlipidemia    Lower back pain    Pericarditis 1986   Tubular adenoma of colon    02/17/10    PSH: Past Surgical History:  Procedure Laterality Date   ATRIAL FIBRILLATION ABLATION N/A 08/01/2023   Procedure: ATRIAL FIBRILLATION ABLATION;  Surgeon: Lei Pump, MD;  Location: [facility] INVASIVE CV LAB;  Service: Cardiovascular;  Laterality: N/A;   COLONOSCOPY     OTHER SURGICAL HISTORY  2003,2004   Back surgery/ 2 laminectomies in lower back   TONSILLECTOMY      SH: Social History   Tobacco Use   Smoking status: Never   Smokeless tobacco: Never   Vaping Use   Vaping status: Never Used  Substance Use Topics   Alcohol use: Yes    Alcohol/week: 3.0 standard drinks of alcohol    Types: 1 Glasses of wine, 1 Cans of beer, 1 Shots of liquor per week    Comment: occassional   Drug use: No    ROS: Constitutional:  Negative for fever, chills, weight loss CV: Negative for chest pain, previous MI, hypertension Respiratory:  Negative for shortness of breath, wheezing, sleep apnea, frequent cough GI:  Negative for nausea, vomiting, bloody stool, GERD  PE: Vitals:   09/04/23 1044  BP: (!) 147/89  Pulse: (!) 56    GENERAL APPEARANCE:  Well appearing, well developed, well nourished, NAD    Results: UA neg for significant blood or infection

## 2023-09-05 LAB — PSA, TOTAL AND FREE
PSA, Free Pct: 17.7 %
PSA, Free: 1.01 ng/mL
Prostate Specific Ag, Serum: 5.7 ng/mL — ABNORMAL HIGH (ref 0.0–4.0)

## 2023-09-05 NOTE — Addendum Note (Signed)
 Addended by: Barkley Boot C on: 09/05/2023 01:03 PM   Modules accepted: Orders

## 2023-09-16 ENCOUNTER — Other Ambulatory Visit: Payer: Self-pay | Admitting: Family Medicine

## 2023-10-09 ENCOUNTER — Other Ambulatory Visit: Payer: Self-pay

## 2023-10-09 ENCOUNTER — Ambulatory Visit: Payer: Self-pay | Admitting: Family Medicine

## 2023-10-09 ENCOUNTER — Ambulatory Visit (INDEPENDENT_AMBULATORY_CARE_PROVIDER_SITE_OTHER): Payer: Medicare Other | Admitting: Family Medicine

## 2023-10-09 ENCOUNTER — Encounter: Payer: Self-pay | Admitting: Family Medicine

## 2023-10-09 VITALS — BP 136/82 | HR 53 | Temp 98.0°F | Resp 16 | Ht 73.0 in | Wt 220.0 lb

## 2023-10-09 DIAGNOSIS — K219 Gastro-esophageal reflux disease without esophagitis: Secondary | ICD-10-CM | POA: Diagnosis not present

## 2023-10-09 DIAGNOSIS — G4700 Insomnia, unspecified: Secondary | ICD-10-CM

## 2023-10-09 DIAGNOSIS — I10 Essential (primary) hypertension: Secondary | ICD-10-CM

## 2023-10-09 LAB — COMPREHENSIVE METABOLIC PANEL WITH GFR
ALT: 21 U/L (ref 0–53)
AST: 23 U/L (ref 0–37)
Albumin: 4.3 g/dL (ref 3.5–5.2)
Alkaline Phosphatase: 54 U/L (ref 39–117)
BUN: 17 mg/dL (ref 6–23)
CO2: 27 meq/L (ref 19–32)
Calcium: 9.8 mg/dL (ref 8.4–10.5)
Chloride: 100 meq/L (ref 96–112)
Creatinine, Ser: 0.97 mg/dL (ref 0.40–1.50)
GFR: 81.83 mL/min (ref >60.00)
Glucose, Bld: 97 mg/dL (ref 70–99)
Potassium: 4.4 meq/L (ref 3.5–5.1)
Sodium: 134 meq/L — ABNORMAL LOW (ref 135–145)
Total Bilirubin: 0.8 mg/dL (ref 0.2–1.2)
Total Protein: 7 g/dL (ref 6.0–8.3)

## 2023-10-09 LAB — LIPID PANEL
Cholesterol: 161 mg/dL (ref 0–200)
HDL: 44.6 mg/dL (ref >39.00)
LDL Cholesterol: 97 mg/dL (ref 0–99)
NonHDL: 116.53
Total CHOL/HDL Ratio: 4
Triglycerides: 98 mg/dL (ref 0.0–149.0)
VLDL: 19.6 mg/dL (ref 0.0–40.0)

## 2023-10-09 MED ORDER — LISINOPRIL 40 MG PO TABS: 40.0000 mg | ORAL_TABLET | Freq: Every day | ORAL | 3 refills | Status: DC

## 2023-10-09 MED ORDER — TRAZODONE HCL 100 MG PO TABS: 100.0000 mg | ORAL_TABLET | Freq: Every day | ORAL | 3 refills | Status: DC

## 2023-10-09 MED ORDER — ZOLPIDEM TARTRATE 5 MG PO TABS: ORAL_TABLET | ORAL | 1 refills | Status: DC

## 2023-10-09 MED ORDER — DIAZEPAM 5 MG PO TABS: ORAL_TABLET | ORAL | 0 refills | Status: DC

## 2023-10-09 MED ORDER — PANTOPRAZOLE SODIUM 40 MG PO TBEC: 40.0000 mg | DELAYED_RELEASE_TABLET | Freq: Every day | ORAL | 3 refills | Status: DC

## 2023-10-09 NOTE — Progress Notes (Signed)
 Chief Complaint  Patient presents with   Follow-up    Follow up     Subjective Javier King is a 68 y.o. male who presents for hypertension follow up. He does monitor home blood pressures. Blood pressures ranging from 120's/70's on average. He is compliant with medication- lisinopril  40 mg/d. Patient has these side effects of medication: none He is usually adhering to a healthy diet overall. Current exercise: walking, wt lifting No CP or SOB.   Insomnia Taking Ambien  5 mg qhs prn. Takes 3-6 times per month.  No adverse effects.  It does help him sleep.  He also takes trazodone  100 mg nightly which helps with both mood and does not cause erectile dysfunction issues.  GERD Taking Protonix  40 mg/d. Compliant, no AE's. Very well controlled on this med. No difficulty swallowing, tarry stools, unintended weight loss.    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   Cancer (HCC) [id number]   skin cancer   DDD (degenerative disc disease), lumbar    Depression 01/14/2016   Edema of both lower extremities    Erectile dysfunction    Essential hypertension 01/14/2016   Hepatic steatosis    History of TIA (transient ischemic attack)    per pt/ unsure if had TIA    Hyperlipidemia    Lower back pain    Pericarditis 1986   Tubular adenoma of colon    02/17/10    Exam BP 136/82 (BP Location: Left Arm, Patient Position: Sitting)   Pulse (!) 53   Temp 98 F (36.7 C) (Oral)   Resp 16   Ht 6\' 1"  (1.854 m)   Wt 220 lb (99.8 kg)   SpO2 100%   BMI 29.03 kg/m  General:  well developed, well nourished, in no apparent distress Heart: Regular rhythm, bradycardic, no bruits, no LE edema Lungs: clear to auscultation, no accessory muscle use Psych: well oriented with normal range of affect and appropriate judgment/insight  Essential hypertension - Plan: Comprehensive metabolic panel with  GFR, Lipid panel  Insomnia, unspecified type - Plan: traZODone  (DESYREL ) 100 MG tablet, zolpidem  (AMBIEN ) 5 MG tablet  Gastroesophageal reflux disease, unspecified whether esophagitis present  Chronic, stable. Cont lisinopril  40 mg/d. Counseled on diet and exercise. Chronic, stable.  Continue trazodone  100 mg daily, Ambien  5 mg daily as needed. Chronic, stable.  Continue pantoprazole  40 mg daily as needed.  Consider transitioning to Pepcid 20 mg twice daily as needed. F/u in 6 mo. The patient voiced understanding and agreement to the plan.  Shellie Dials [place], DO 10/09/23  12:20 PM

## 2023-10-09 NOTE — Patient Instructions (Addendum)
Give [place] 2-3 business days to get the results of your labs back.   Keep the diet clean and stay active.  Pepcid/famotidine 20 mg 1-2 times daily can help with reflux symptoms.   Let us know if you need anything.

## 2023-10-11 ENCOUNTER — Other Ambulatory Visit (HOSPITAL_COMMUNITY): Payer: Self-pay

## 2023-10-11 ENCOUNTER — Telehealth: Payer: Self-pay

## 2023-10-11 NOTE — Telephone Encounter (Addendum)
 Pharmacy Patient Advocate Encounter  Received notification from [facility] that Prior Authorization for Zolpidem  5mg  tabs has been DENIED.  Full denial letter will be uploaded to the media tab. See denial reason below.  PA #/Case ID/Reference #: [id number]  Patient must try doxepin first per insurance.

## 2023-10-11 NOTE — Telephone Encounter (Signed)
 Pharmacy Patient Advocate Encounter   Received notification from CoverMyMeds that prior authorization for Zolpidem  Tartrate 5MG  tablets is required/requested.   Insurance verification completed.   The patient is insured through [facility] .   Per test claim: PA required; PA submitted to above mentioned insurance via CoverMyMeds Key/confirmation #/EOC [id number] Status is pending

## 2023-10-12 ENCOUNTER — Other Ambulatory Visit (HOSPITAL_COMMUNITY): Payer: Self-pay

## 2023-10-12 MED ORDER — DOXEPIN HCL 6 MG PO TABS: 6.0000 mg | ORAL_TABLET | Freq: Every evening | ORAL | 2 refills | Status: DC

## 2023-10-12 NOTE — Addendum Note (Signed)
 Addended by: Creed Dodrill on: 10/12/2023 07:20 AM   Modules accepted: Orders

## 2023-10-12 NOTE — Telephone Encounter (Signed)
 Called pt Javier King letting him know, Patient must try doxepin first per insurance. Call our  Office back if he has any question.

## 2023-10-12 NOTE — Telephone Encounter (Signed)
 Plz inform pt and I will send in the doxepin in meanwhile. Thx.

## 2023-10-14 ENCOUNTER — Other Ambulatory Visit

## 2023-10-31 NOTE — Progress Notes (Deleted)
  Electrophysiology Office Note:   Date:  10/31/2023  ID:  Javier King, DOB 04/10/1956, MRN [id number]  Primary Cardiologist: Euell Herrlich, MD Primary Heart Failure: None Electrophysiologist: Will Cortland Ding, MD  {Click to update primary MD,subspecialty MD or APP then REFRESH:1}    History of Present Illness:   Javier King is a 68 y.o. male with h/o AF, HTN, HLD, pericarditis, GERD, anxiety, obesity seen today for routine electrophysiology follow-up s/p Ablation.  Since last being seen in our clinic the patient reports doing ***.    He denies chest pain, palpitations, dyspnea, PND, orthopnea, nausea, vomiting, dizziness, syncope, edema, weight gain, or early satiety.    Review of systems complete and found to be negative unless listed in HPI.   EP Information / Studies Reviewed:    EKG is ordered today. Personal review as below.      Studies:  ECHO 03/2023 > LVEF 55-60%, mild MV regurgitation CT Cardiac Morphology / Pulm 07/17/23 > normal PV drainage into LA, no PFO/ASD, normal coronary origin/right dominance, CAC score of 0 EPS 08/01/23 > AF on presentation, successful isolation / anatomical encircling of all 4 PV's with PF ablation, ablation of posterior wall with PF  Arrhythmia / AAD AF    Risk Assessment/Calculations:    CHA2DS2-VASc Score = 4  {Confirm score is correct.  If not, click here to update score.  REFRESH note.  :1} This indicates a 4.8% annual risk of stroke. The patient's score is based upon: CHF History: 0 HTN History: 1 Diabetes History: 0 Stroke History: 2 Vascular Disease History: 0 Age Score: 1 Gender Score: 0   {This patient has a significant risk of stroke if diagnosed with atrial fibrillation.  Please consider VKA or DOAC agent for anticoagulation if the bleeding risk is acceptable.   You can also use the SmartPhrase .[email or web address] for documentation.   :[id number]} No BP recorded.  {Refresh Note OR Click here to enter BP  :1}***         Physical Exam:   VS:  There were no vitals taken for this visit.   Wt Readings from Last 3 Encounters:  10/09/23 220 lb (99.8 kg)  08/29/23 226 lb 3.2 oz (102.6 kg)  08/01/23 220 lb (99.8 kg)     GEN: Well nourished, well developed in no acute distress NECK: No JVD; No carotid bruits CARDIAC: {EPRHYTHM:28826}, no murmurs, rubs, gallops RESPIRATORY:  Clear to auscultation without rales, wheezing or rhonchi  ABDOMEN: Soft, non-tender, non-distended EXTREMITIES:  No edema; No deformity   ASSESSMENT AND PLAN:    Persistent Atrial Fibrillation  CHA2DS2-VASc 4, s/p ablation 08/01/23 (PV +PW with PF) -continue OAC for stroke prophylaxis  -monitors with ***  -EKG with NSR ***  -no symptom burden post ablation ***  Secondary Hypercoagulable State  -continue Eliquis  5mg  BID, dose reviewed and appropriate by age / wt   Hypertension  -well controlled on current regimen ***   Follow up with Dr. Lawana Pray {EPFOLLOW XL:24401}  Signed, Creighton Doffing, NP-C, AGACNP-BC [facility] - Electrophysiology  10/31/2023, 1:22 PM

## 2023-11-01 ENCOUNTER — Ambulatory Visit: Admitting: Pulmonary Disease

## 2023-11-01 DIAGNOSIS — D6869 Other thrombophilia: Secondary | ICD-10-CM

## 2023-11-01 DIAGNOSIS — I4819 Other persistent atrial fibrillation: Secondary | ICD-10-CM

## 2023-11-01 DIAGNOSIS — I10 Essential (primary) hypertension: Secondary | ICD-10-CM

## 2023-11-02 ENCOUNTER — Encounter: Payer: Self-pay | Admitting: Pulmonary Disease

## 2023-11-05 ENCOUNTER — Emergency Department (HOSPITAL_BASED_OUTPATIENT_CLINIC_OR_DEPARTMENT_OTHER)

## 2023-11-05 ENCOUNTER — Encounter (HOSPITAL_BASED_OUTPATIENT_CLINIC_OR_DEPARTMENT_OTHER): Payer: Self-pay | Admitting: Emergency Medicine

## 2023-11-05 ENCOUNTER — Emergency Department (HOSPITAL_BASED_OUTPATIENT_CLINIC_OR_DEPARTMENT_OTHER)
Admission: EM | Admit: 2023-11-05 | Discharge: 2023-11-06 | Disposition: A | Discharge status: Home / Self Care | Attending: Emergency Medicine | Admitting: Emergency Medicine

## 2023-11-05 ENCOUNTER — Other Ambulatory Visit: Payer: Self-pay

## 2023-11-05 DIAGNOSIS — T796XXA Traumatic ischemia of muscle, initial encounter: Secondary | ICD-10-CM | POA: Insufficient documentation

## 2023-11-05 DIAGNOSIS — R944 Abnormal results of kidney function studies: Secondary | ICD-10-CM | POA: Diagnosis not present

## 2023-11-05 DIAGNOSIS — Z7901 Long term (current) use of anticoagulants: Secondary | ICD-10-CM | POA: Insufficient documentation

## 2023-11-05 DIAGNOSIS — S0031XA Abrasion of nose, initial encounter: Secondary | ICD-10-CM | POA: Insufficient documentation

## 2023-11-05 DIAGNOSIS — S0081XA Abrasion of other part of head, initial encounter: Secondary | ICD-10-CM | POA: Diagnosis not present

## 2023-11-05 DIAGNOSIS — S0993XA Unspecified injury of face, initial encounter: Secondary | ICD-10-CM | POA: Diagnosis present

## 2023-11-05 DIAGNOSIS — S80211A Abrasion, right knee, initial encounter: Secondary | ICD-10-CM | POA: Diagnosis not present

## 2023-11-05 DIAGNOSIS — W19XXXA Unspecified fall, initial encounter: Secondary | ICD-10-CM | POA: Insufficient documentation

## 2023-11-05 DIAGNOSIS — D72829 Elevated white blood cell count, unspecified: Secondary | ICD-10-CM | POA: Diagnosis not present

## 2023-11-05 DIAGNOSIS — Z79899 Other long term (current) drug therapy: Secondary | ICD-10-CM | POA: Insufficient documentation

## 2023-11-05 DIAGNOSIS — T07XXXA Unspecified multiple injuries, initial encounter: Secondary | ICD-10-CM

## 2023-11-05 DIAGNOSIS — S80212A Abrasion, left knee, initial encounter: Secondary | ICD-10-CM | POA: Diagnosis not present

## 2023-11-05 DIAGNOSIS — I4891 Unspecified atrial fibrillation: Secondary | ICD-10-CM | POA: Diagnosis not present

## 2023-11-05 DIAGNOSIS — R410 Disorientation, unspecified: Secondary | ICD-10-CM | POA: Diagnosis not present

## 2023-11-05 DIAGNOSIS — R748 Abnormal levels of other serum enzymes: Secondary | ICD-10-CM

## 2023-11-05 DIAGNOSIS — Z23 Encounter for immunization: Secondary | ICD-10-CM | POA: Diagnosis not present

## 2023-11-05 DIAGNOSIS — R7401 Elevation of levels of liver transaminase levels: Secondary | ICD-10-CM | POA: Diagnosis not present

## 2023-11-05 DIAGNOSIS — S0990XA Unspecified injury of head, initial encounter: Secondary | ICD-10-CM

## 2023-11-05 DIAGNOSIS — S40811A Abrasion of right upper arm, initial encounter: Secondary | ICD-10-CM | POA: Insufficient documentation

## 2023-11-05 DIAGNOSIS — Z8616 Personal history of COVID-19: Secondary | ICD-10-CM | POA: Insufficient documentation

## 2023-11-05 LAB — CBC
HCT: 38.8 % — ABNORMAL LOW (ref 39.0–52.0)
Hemoglobin: 13.3 g/dL (ref 13.0–17.0)
MCH: 29.9 pg (ref 26.0–34.0)
MCHC: 34.3 g/dL (ref 30.0–36.0)
MCV: 87.2 fL (ref 80.0–100.0)
Platelets: 280 10*3/uL (ref 150–400)
RBC: 4.45 MIL/uL (ref 4.22–5.81)
RDW: 13.2 % (ref 11.5–15.5)
WBC: 11.4 10*3/uL — ABNORMAL HIGH (ref 4.0–10.5)
nRBC: 0 % (ref 0.0–0.2)

## 2023-11-05 LAB — RESP PANEL BY RT-PCR (RSV, FLU A&B, COVID)  RVPGX2
Influenza A by PCR: NEGATIVE
Influenza B by PCR: NEGATIVE
Resp Syncytial Virus by PCR: NEGATIVE
SARS Coronavirus 2 by RT PCR: NEGATIVE

## 2023-11-05 LAB — COMPREHENSIVE METABOLIC PANEL WITH GFR
ALT: 123 U/L — ABNORMAL HIGH (ref 0–44)
AST: 258 U/L — ABNORMAL HIGH (ref 15–41)
Albumin: 3.8 g/dL (ref 3.5–5.0)
Alkaline Phosphatase: 80 U/L (ref 38–126)
Anion gap: 14 (ref 5–15)
BUN: 25 mg/dL — ABNORMAL HIGH (ref 8–23)
CO2: 23 mmol/L (ref 22–32)
Calcium: 9.2 mg/dL (ref 8.9–10.3)
Chloride: 98 mmol/L (ref 98–111)
Creatinine, Ser: 1.33 mg/dL — ABNORMAL HIGH (ref 0.61–1.24)
GFR, Estimated: 59 mL/min — ABNORMAL LOW (ref >60)
Glucose, Bld: 110 mg/dL — ABNORMAL HIGH (ref 70–99)
Potassium: 3.8 mmol/L (ref 3.5–5.1)
Sodium: 135 mmol/L (ref 135–145)
Total Bilirubin: 0.8 mg/dL (ref 0.0–1.2)
Total Protein: 6.6 g/dL (ref 6.5–8.1)

## 2023-11-05 LAB — CK: Total CK: 3726 U/L — ABNORMAL HIGH (ref 49–397)

## 2023-11-05 LAB — CBG MONITORING, ED: Glucose-Capillary: 90 mg/dL (ref 70–99)

## 2023-11-05 MED ORDER — SODIUM CHLORIDE 0.9 % IV BOLUS
1000.0000 mL | Freq: Once | INTRAVENOUS | Status: AC
  Administered 2023-11-05: 1000 mL via INTRAVENOUS

## 2023-11-05 MED ORDER — TETANUS-DIPHTH-ACELL PERTUSSIS 5-2.5-18.5 LF-MCG/0.5 IM SUSY
0.5000 mL | PREFILLED_SYRINGE | Freq: Once | INTRAMUSCULAR | Status: AC
  Administered 2023-11-05: 0.5 mL via INTRAMUSCULAR
  Filled 2023-11-05: qty 0.5

## 2023-11-05 MED ORDER — BACITRACIN ZINC 500 UNIT/GM EX OINT
TOPICAL_OINTMENT | Freq: Two times a day (BID) | CUTANEOUS | Status: DC
  Administered 2023-11-05: 31.5 via TOPICAL
  Filled 2023-11-05: qty 28.35

## 2023-11-05 NOTE — ED Provider Notes (Signed)
 [place] [facility] AT [facility] Provider Note   CSN: [id number] Arrival date & time: 11/05/23  2040     Patient presents with: Fall and Altered Mental Status   Javier King is a 68 y.o. male.  He has a prior history of A-fib, insulin  resistance, elevated liver enzymes.  He said he felt a sore throat and feverish last week and tested himself and was positive for COVID.  He has felt very weak and disoriented and thinks he may have had a syncopal event Thursday or Friday night.  He woke up on the floor in his closet.  Noted multiple abrasions and blistering on his knees and especially on his right arm and face.  Friends went to check on him today and recommended he come here for further evaluation.  He said he is feeling somewhat better and more clear in his thinking.  Right arm is sore and swollen.  Has not been eating or drinking much.  No significant cough.  No vomiting or diarrhea.  {Add pertinent medical, surgical, social history, OB history to GEX:52841} The history is provided by the patient.  Altered Mental Status Presenting symptoms: confusion and lethargy   Most recent episode:  2 days ago Progression:  Improving Chronicity:  New Context: recent illness   Associated symptoms: no abdominal pain, no nausea and no vomiting        Prior to Admission medications   Medication Sig Start Date End Date Taking? Authorizing Provider  apixaban  (ELIQUIS ) 5 MG TABS tablet Take 1 tablet (5 mg total) by mouth 2 (two) times daily. 07/05/23   Camnitz, Babetta Lesch, MD  diazepam  (VALIUM ) 5 MG tablet Take 1 tab by mouth 45-60 min prior to MRI. Repeat dose if no improvement in 15 minutes. 10/09/23   Jobe Mulder, DO  Doxepin  HCl 6 MG TABS Take 1 tablet (6 mg total) by mouth at bedtime. 10/12/23   Jobe Mulder, DO  lisinopril  (ZESTRIL ) 40 MG tablet Take 1 tablet (40 mg total) by mouth daily. 10/09/23   Jobe Mulder, DO  Multiple Vitamin  (MULTIVITAMIN WITH MINERALS) TABS tablet Take 1 tablet by mouth daily.    [provider]  Omega-3 Fatty Acids (OMEGA 3 PO) Take 1 capsule by mouth daily.    [provider]  pantoprazole  (PROTONIX ) 40 MG tablet Take 1 tablet (40 mg total) by mouth daily before breakfast. 10/09/23   Gwenette Lennox, Shellie Dials, DO  tadalafil  (CIALIS ) 20 MG tablet TAKE 1/2 TO 1 (ONE-HALF TO ONE) TABLET BY MOUTH EVERY OTHER DAY AS NEEDED FOR ERECTILE DYSFUNCTION 09/17/23   Jobe Mulder, DO  traZODone  (DESYREL ) 100 MG tablet Take 1 tablet (100 mg total) by mouth at bedtime. 10/09/23   Jobe Mulder, DO  zolpidem  (AMBIEN ) 5 MG tablet TAKE 1 TABLET AT BEDTIME ASNEEDED FOR SLEEP 10/09/23   Jobe Mulder, DO    Allergies: Penicillins    Review of Systems  Constitutional:  Positive for fatigue.  HENT:  Positive for sore throat.   Eyes:  Negative for visual disturbance.  Respiratory:  Negative for shortness of breath.   Cardiovascular:  Negative for chest pain.  Gastrointestinal:  Negative for abdominal pain, nausea and vomiting.  Skin:  Positive for wound.  Neurological:  Positive for syncope.  Psychiatric/Behavioral:  Positive for confusion.     Updated Vital Signs BP (!) 108/50 (BP Location: Left Arm)   Pulse 70   Temp 97.6 F (36.4 C)  Resp 18   Ht 6' 1 (1.854 m)   Wt 99.8 kg   SpO2 98%   BMI 29.03 kg/m   Physical Exam Vitals and nursing note reviewed.  Constitutional:      General: He is not in acute distress.    Appearance: Normal appearance. He is well-developed.  HENT:     Head: Normocephalic.     Comments: Abrasions over face and nose  Eyes:     Conjunctiva/sclera: Conjunctivae normal.    Cardiovascular:     Rate and Rhythm: Normal rate and regular rhythm.     Heart sounds: No murmur heard. Pulmonary:     Effort: Pulmonary effort is normal. No respiratory distress.     Breath sounds: Normal breath sounds.  Abdominal:     Palpations:  Abdomen is soft.     Tenderness: There is no abdominal tenderness. There is no guarding or rebound.   Musculoskeletal:        General: Swelling and tenderness present.     Cervical back: Neck supple.     Comments: Abrasions over right arm and arm swelling.  Compartments soft though.  Full range of motion.  Abrasions over both knees.   Skin:    General: Skin is warm and dry.     Capillary Refill: Capillary refill takes less than 2 seconds.   Neurological:     General: No focal deficit present.     Mental Status: He is alert and oriented to person, place, and time.     Cranial Nerves: No cranial nerve deficit.     Sensory: No sensory deficit.     Motor: No weakness.        (all labs ordered are listed, but only abnormal results are displayed) Labs Reviewed  COMPREHENSIVE METABOLIC PANEL WITH GFR - Abnormal; Notable for the following components:      Result Value   Glucose, Bld 110 (*)    BUN 25 (*)    Creatinine, Ser 1.33 (*)    AST 258 (*)    ALT 123 (*)    GFR, Estimated 59 (*)    All other components within normal limits  CBC - Abnormal; Notable for the following components:   WBC 11.4 (*)    HCT 38.8 (*)    All other components within normal limits  CK - Abnormal; Notable for the following components:   Total CK 3,726 (*)    All other components within normal limits  RESP PANEL BY RT-PCR (RSV, FLU A&B, COVID)  RVPGX2  URINALYSIS, ROUTINE W REFLEX MICROSCOPIC  HEPATITIS PANEL, ACUTE  ACETAMINOPHEN  LEVEL  SALICYLATE LEVEL  CBG MONITORING, ED    EKG: None  Radiology: CT HEAD WO CONTRAST Result Date: 11/05/2023 CLINICAL DATA:  Head trauma head trauma altered EXAM: CT HEAD WITHOUT CONTRAST TECHNIQUE: Contiguous axial images were obtained from the base of the skull through the vertex without intravenous contrast. RADIATION DOSE REDUCTION: This exam was performed according to the departmental dose-optimization program which includes automated exposure control,  adjustment of the mA and/or kV according to patient size and/or use of iterative reconstruction technique. COMPARISON:  None Available. FINDINGS: Brain: No evidence of acute infarction, hemorrhage, hydrocephalus, extra-axial collection or mass lesion/mass effect. Vascular: No hyperdense vessel or unexpected calcification. Skull: Normal. Negative for fracture or focal lesion. Sinuses/Orbits: No acute finding. Other: None IMPRESSION: Negative non contrasted CT appearance of the brain. Electronically Signed   By: Esmeralda Hedge M.D.   On: 11/05/2023 21:53   DG  Shoulder Right Result Date: 11/05/2023 CLINICAL DATA:  Fall with pain EXAM: RIGHT SHOULDER - 2+ VIEW COMPARISON:  None Available. FINDINGS: No acute fracture or malalignment. Moderate AC joint degenerative change. The right apex is clear IMPRESSION: No acute osseous abnormality. Electronically Signed   By: Esmeralda Hedge M.D.   On: 11/05/2023 21:41   DG Wrist Complete Right Result Date: 11/05/2023 CLINICAL DATA:  Fall with wrist pain EXAM: RIGHT WRIST - COMPLETE 3+ VIEW COMPARISON:  None Available. FINDINGS: No definitive fracture or malalignment. Diffuse soft tissue swelling. IMPRESSION: No definitive fracture. Diffuse soft tissue swelling. Electronically Signed   By: Esmeralda Hedge M.D.   On: 11/05/2023 21:41   DG Hand Complete Right Result Date: 11/05/2023 CLINICAL DATA:  Fall with pain confusion EXAM: RIGHT HAND - COMPLETE 3+ VIEW COMPARISON:  None Available. FINDINGS: No acute displaced fracture or malalignment. Diffuse soft tissue swelling. IMPRESSION: No acute osseous abnormality. Electronically Signed   By: Esmeralda Hedge M.D.   On: 11/05/2023 21:39   DG Chest 1 View Result Date: 11/05/2023 CLINICAL DATA:  Fall with pain EXAM: CHEST  1 VIEW COMPARISON:  None Available. FINDINGS: The heart size and mediastinal contours are within normal limits. Both lungs are clear. The visualized skeletal structures are unremarkable. IMPRESSION: No active disease.  Electronically Signed   By: Esmeralda Hedge M.D.   On: 11/05/2023 21:38    {Document cardiac monitor, telemetry assessment procedure when appropriate:32947} Procedures   Medications Ordered in the ED  Tdap (BOOSTRIX) injection 0.5 mL (0.5 mLs Intramuscular Given 11/05/23 2134)  sodium chloride  0.9 % bolus 1,000 mL (1,000 mLs Intravenous New Bag/Given 11/05/23 2306)    Clinical Course as of 11/05/23 2312  Mon Nov 05, 2023  2113 EKG not crossing into epic.  Normal sinus rhythm no acute ST-T changes. [MB]  2145 X-rays of chest right shoulder right wrist right hand do not show any acute traumatic findings other than soft tissue swelling.  Awaiting radiology reading [MB]    Clinical Course User Index [MB] Tonya Fredrickson, MD   {Click here for ABCD2, HEART and other calculators REFRESH Note before signing:1}                              Medical Decision Making Amount and/or Complexity of Data Reviewed Labs: ordered. Radiology: ordered.  Risk Prescription drug management.   This patient complains of ***; this involves an extensive number of treatment Options and is a complaint that carries with it a high risk of complications and morbidity. The differential includes ***  I ordered, reviewed and interpreted labs, which included *** I ordered medication *** and reviewed PMP when indicated. I ordered imaging studies which included *** and I independently    visualized and interpreted imaging which showed *** Additional history obtained from *** Previous records obtained and reviewed *** I consulted *** and discussed lab and imaging findings and discussed disposition.  Cardiac monitoring reviewed, *** Social determinants considered, *** Critical Interventions: ***  After the interventions stated above, I reevaluated the patient and found *** Admission and further testing considered, ***   {Document critical care time when appropriate  Document review of labs and clinical decision  tools ie CHADS2VASC2, etc  Document your independent review of radiology images and any outside records  Document your discussion with family members, caretakers and with consultants  Document social determinants of health affecting pt's care  Document your decision making why or  why not admission, treatments were needed:32947:::1}   Final diagnoses:  None    ED Discharge Orders     None

## 2023-11-05 NOTE — Discharge Instructions (Signed)
 You were seen in the emergency department after an episode of confusion, possible fall, abrasions and head injury.  Your lab work showed your muscle enzymes and liver enzymes to be elevated.  Please use soap and water for your wounds and apply some antibiotic ointment.  Drink plenty of fluids.  Follow-up with your primary care doctor this week for repeat lab work.

## 2023-11-05 NOTE — ED Notes (Signed)
 Pt expresses felling hopeless due to current circumstances, when asked if he would ever act in self harm, pt reports  I dont know, everyday gets darker and darker

## 2023-11-05 NOTE — ED Notes (Signed)
 Patient transported to CT

## 2023-11-05 NOTE — ED Triage Notes (Signed)
 Pt POV in wheelchair- pt tested + for Covid Wednesday or Thursday. Reports feeling light headed Saturday, fell, + LOC. Pt reports intermittent confusion. Poor po intake.  Reports 2nd fall yesterday.  Wounds partially healed to R anterior head, R wrist (swelling noted), R shoulder, R hip, R knee, R foot.   Pt answers questions appropriately, but delayed. Denies urinary sx, ShOB.   Pt reports not currently taking eliquis  x3 months.

## 2023-11-06 ENCOUNTER — Other Ambulatory Visit

## 2023-11-06 DIAGNOSIS — S0081XA Abrasion of other part of head, initial encounter: Secondary | ICD-10-CM | POA: Diagnosis not present

## 2023-11-06 LAB — HEPATITIS PANEL, ACUTE
HCV Ab: NONREACTIVE
Hep A IgM: NONREACTIVE
Hep B C IgM: NONREACTIVE
Hepatitis B Surface Ag: NONREACTIVE

## 2023-11-06 LAB — URINALYSIS, ROUTINE W REFLEX MICROSCOPIC
Glucose, UA: NEGATIVE mg/dL
Hgb urine dipstick: NEGATIVE
Ketones, ur: 15 mg/dL — AB
Leukocytes,Ua: NEGATIVE
Nitrite: NEGATIVE
Protein, ur: 30 mg/dL — AB
Specific Gravity, Urine: 1.025 (ref 1.005–1.030)
pH: 5.5 (ref 5.0–8.0)

## 2023-11-06 LAB — URINALYSIS, MICROSCOPIC (REFLEX)

## 2023-11-06 LAB — ACETAMINOPHEN LEVEL: Acetaminophen (Tylenol), Serum: <10 ug/mL — ABNORMAL LOW (ref 10–30)

## 2023-11-06 LAB — SALICYLATE LEVEL: Salicylate Lvl: <7 mg/dL — ABNORMAL LOW (ref 7.0–30.0)

## 2023-11-06 LAB — CK: Total CK: 3549 U/L — ABNORMAL HIGH (ref 49–397)

## 2023-11-06 NOTE — ED Provider Notes (Signed)
  Physical Exam  BP (!) 151/54   Pulse 70   Temp 98.7 F (37.1 C) (Oral)   Resp 20   Ht 6' 1 (1.854 m)   Wt 99.8 kg   SpO2 99%   BMI 29.03 kg/m   Physical Exam Vitals and nursing note reviewed.  Constitutional:      Appearance: Normal appearance.  HENT:     Head:     Comments: There are abrasions noted to the forehead and bridge of the nose.  Cardiovascular:     Rate and Rhythm: Normal rate and regular rhythm.  Pulmonary:     Effort: Pulmonary effort is normal.   Neurological:     General: No focal deficit present.     Mental Status: He is alert and oriented to person, place, and time.     Procedures  Procedures  ED Course / MDM   Clinical Course as of 11/06/23 0148  Mon Nov 05, 2023  2113 EKG not crossing into epic.  Normal sinus rhythm no acute ST-T changes. [MB]  2145 X-rays of chest right shoulder right wrist right hand do not show any acute traumatic findings other than soft tissue swelling.  Awaiting radiology reading [MB]    Clinical Course User Index [MB] Javier Fredrickson, MD   Medical Decision Making Amount and/or Complexity of Data Reviewed Labs: ordered. Radiology: ordered.  Risk OTC drugs. Prescription drug management.   Care assumed from Dr. Randal King at shift change.  Patient presenting here for evaluation of weakness and lightheadedness for the past several days.  He apparently had a fall on Saturday and developed wounds to his right forearm and right head.  Care was signed out to me awaiting results of repeat total CK as his initial study was elevated.  Patient has received IV fluids and repeat is on the downtrend at 3500.  I discussed the disposition with the patient who would prefer not to be admitted.  As his CK is now downtrending and renal function has not been significantly affected, I feel as though this is appropriate.  Patient to be discharged with increased fluid intake and follow-up with primary doctor.       Javier Blander,  MD 11/06/23 [phone]

## 2023-11-21 ENCOUNTER — Telehealth: Payer: Self-pay

## 2023-11-21 ENCOUNTER — Encounter: Payer: Self-pay | Admitting: Family Medicine

## 2023-11-21 ENCOUNTER — Ambulatory Visit: Admitting: Family Medicine

## 2023-11-21 VITALS — BP 138/76 | HR 62 | Temp 98.0°F | Resp 16 | Ht 73.0 in | Wt 209.4 lb

## 2023-11-21 DIAGNOSIS — F411 Generalized anxiety disorder: Secondary | ICD-10-CM

## 2023-11-21 DIAGNOSIS — G4700 Insomnia, unspecified: Secondary | ICD-10-CM

## 2023-11-21 MED ORDER — TRAZODONE HCL 150 MG PO TABS
150.0000 mg | ORAL_TABLET | Freq: Every day | ORAL | 0 refills | Status: DC
Start: 2023-11-21 — End: 2024-02-18

## 2023-11-21 MED ORDER — ZOLPIDEM TARTRATE 5 MG PO TABS: 5.0000 mg | ORAL_TABLET | Freq: Every evening | ORAL | 1 refills | Status: DC | PRN

## 2023-11-21 MED ORDER — PROPRANOLOL HCL 10 MG PO TABS: ORAL_TABLET | ORAL | 1 refills | Status: DC

## 2023-11-21 NOTE — Telephone Encounter (Signed)
 Copied from CRM [id number]. Topic: Clinical - Medication Prior Auth >> Nov 21, 2023  4:28 PM Martinique E wrote: Reason for CRM: Patient called in stating that his pharmacy told him that his medication zolpidem  (AMBIEN ) 5 MG tablet needs prior authorization.

## 2023-11-21 NOTE — Telephone Encounter (Signed)
 Copied from CRM [id number]. Topic: Clinical - Medication Prior Auth >> Nov 21, 2023  4:28 PM Javier King wrote: Reason for CRM: Patient called in stating that his pharmacy told him that his medication zolpidem  (AMBIEN ) 5 MG tablet needs prior authorization.

## 2023-11-21 NOTE — Progress Notes (Signed)
 Chief Complaint  Patient presents with   Anxiety    Anxiety and Insomnia     Subjective Javier King presents for f/u anxiety.  Pt is currently being treated with trazodone  100 mg nightly, Ambien  5 mg nightly as needed.  Reports struggling over the past 2 weeks.  He is currently looking for a job and having some financial struggles. He sustained a fall in mid June and anxiety increased since then. He has been going for short walks.  No thoughts of harming self or others. No self-medication with alcohol, prescription drugs or illicit drugs. Pt is not following with a counselor/psychologist.  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   Cancer (HCC) 7982,7986   skin cancer   DDD (degenerative disc disease), lumbar    Depression 01/14/2016   Edema of both lower extremities    Erectile dysfunction    Essential hypertension 01/14/2016   Hepatic steatosis    History of TIA (transient ischemic attack)    per pt/ unsure if had TIA    Hyperlipidemia    Lower back pain    Pericarditis 1986   Tubular adenoma of colon    02/17/10   Allergies as of 11/21/2023       Reactions   Penicillins Itching        Medication List        Accurate as of November 21, 2023 11:40 AM. If you have any questions, ask your nurse or doctor.          STOP taking these medications    diazepam  5 MG tablet Commonly known as: Valium  Stopped by: Mabel Mt Sister Carbone   Doxepin  HCl 6 MG Tabs Stopped by: Mabel Mt Sadie Hazelett       TAKE these medications    apixaban  5 MG Tabs tablet Commonly known as: ELIQUIS  Take 1 tablet (5 mg total) by mouth 2 (two) times daily.   lisinopril  40 MG tablet Commonly known as: ZESTRIL  Take 1 tablet (40 mg total) by mouth daily.   multivitamin with minerals Tabs tablet Take 1 tablet by mouth daily.   OMEGA 3 PO Take 1 capsule by mouth daily.    pantoprazole  40 MG tablet Commonly known as: PROTONIX  Take 1 tablet (40 mg total) by mouth daily before breakfast.   propranolol 10 MG tablet Commonly known as: INDERAL Take 1-2 tabs by mouth 3 times daily as needed for anxiety/panic attacks. Started by: Mabel Mt Pry   tadalafil  20 MG tablet Commonly known as: CIALIS  TAKE 1/2 TO 1 (ONE-HALF TO ONE) TABLET BY MOUTH EVERY OTHER DAY AS NEEDED FOR ERECTILE DYSFUNCTION   traZODone  150 MG tablet Commonly known as: DESYREL  Take 1 tablet (150 mg total) by mouth at bedtime. What changed:  medication strength how much to take Changed by: Mabel Mt Pry   zolpidem  5 MG tablet Commonly known as: AMBIEN  Take 1-2 tablets (5-10 mg total) by mouth at bedtime as needed for sleep. What changed:  how much to take how to take this when to take this reasons to take this additional instructions Changed by: Mabel Mt Pry        Exam BP 138/76 (BP Location: Left Arm, Cuff Size: Normal)   Pulse 62   Temp 98 F (36.7 C) (Oral)   Resp 16   Ht 6' 1 (1.854 m)   Wt 209 lb 6.4 oz (95 kg)  SpO2 96%   BMI 27.63 kg/m  General:  well developed, well nourished, in no apparent distress Lungs:  No respiratory distress Psych: well oriented with normal range of affect and age-appropriate judgement/insight, alert and oriented x4.  Assessment and Plan  GAD (generalized anxiety disorder) - Plan: propranolol (INDERAL) 10 MG tablet, traZODone  (DESYREL ) 150 MG tablet  Insomnia, unspecified type - Plan: zolpidem  (AMBIEN ) 5 MG tablet  Chronic, not controlled.  Add propranolol 10-20 mg 3 times daily as needed.  Increase trazodone  from 100 mg daily 150 mg daily.  We will also increase his range of Ambien  to 5-10 mg qhs prn.  Counseled on exercise.  Anxiety coping techniques provided.  Consider counseling. F/u in 1 month. The patient voiced understanding and agreement to the plan.  Mabel Mt [place], DO 11/21/23 11:40  AM

## 2023-11-21 NOTE — Patient Instructions (Signed)

## 2023-11-22 ENCOUNTER — Telehealth: Payer: Self-pay

## 2023-11-22 ENCOUNTER — Other Ambulatory Visit (HOSPITAL_COMMUNITY): Payer: Self-pay

## 2023-11-22 NOTE — Telephone Encounter (Signed)
 Pharmacy Patient Advocate Encounter  Received notification from [facility] that Prior Authorization for Zolpidem  5mg  tabs has been CANCELLED due to a previous denial is on file.      Previous denial was due to patient not having a trial of Doxepin . Last office visit states a d/c of Doxepin .

## 2023-11-22 NOTE — Telephone Encounter (Signed)
 See other telephone note. PA in progress.

## 2023-11-22 NOTE — Telephone Encounter (Signed)
 Copied from CRM [id number]. Topic: Clinical - Medication Prior Auth >> Nov 22, 2023  9:59 AM Mercedes MATSU wrote: Reason for CRM: Patient called in stating that his pharmacy on file call and told him that his medication zolpidem  (AMBIEN ) 5 MG tablet is waiting for prior authorization approval. Patient is requesting a call back with this information and can be reached at [phone].

## 2023-11-22 NOTE — Telephone Encounter (Signed)
 Pharmacy Patient Advocate Encounter   Received notification from Pt Calls Messages that prior authorization for Zolpidem  5mg  tabs is required/requested.   Insurance verification completed.   The patient is insured through [facility] .   Per test claim: PA required; PA submitted to above mentioned insurance via CoverMyMeds Key/confirmation #/[facility] Status is pending

## 2023-11-26 ENCOUNTER — Telehealth: Payer: Self-pay

## 2023-11-26 NOTE — Telephone Encounter (Signed)
 Copied from CRM [id number]. Topic: Clinical - Medication Question >> Nov 26, 2023  2:42 PM Ernestene P wrote: Reason for CRM: Pt is requesting refill for methylPREDNISolone  (MEDROL  DOSEPAK) 4 MG TBPK tablet [id number] [place] [place] - [address] [place] [address] Phone: [phone] Fax: [phone]

## 2023-11-27 ENCOUNTER — Telehealth: Payer: Self-pay | Admitting: Pharmacist

## 2023-11-27 MED ORDER — METHYLPREDNISOLONE 4 MG PO TBPK: ORAL_TABLET | ORAL | 0 refills | Status: DC

## 2023-11-27 NOTE — Telephone Encounter (Signed)
 Per the initial denial, the insurance plan requires the patient to trial doxepin . I was unable to find any dispense records for this medication, but I did note that it was listed as discontinued at the 07/02 office visit.  Can you confirm whether the patient actually trialed doxepin , or if it was considered contraindicated? To draft the most effective appeal, any clarification would be helpful.  If the medication was not used due to safety concerns, we may be able to emphasize the potential increased risk of serotonin syndrome when doxepin  is combined with trazodone . The more clinical detail we can provide, the stronger our case will be.  Please advise!  Thank you, Devere Pandy, PharmD Clinical Pharmacist  [facility]  Direct Dial: [phone]

## 2023-11-27 NOTE — Telephone Encounter (Signed)
 Can we reach out to the pt to see if he actually filled it? That's a pretty high dose of trazodone  to add another serotonin affecting agent.

## 2023-11-28 ENCOUNTER — Encounter: Payer: Self-pay | Admitting: Family Medicine

## 2023-11-28 ENCOUNTER — Other Ambulatory Visit: Payer: Self-pay | Admitting: Family Medicine

## 2023-11-28 DIAGNOSIS — F411 Generalized anxiety disorder: Secondary | ICD-10-CM

## 2023-11-30 ENCOUNTER — Telehealth: Payer: Self-pay

## 2023-11-30 NOTE — Telephone Encounter (Signed)
 Copied from CRM [id number]. Topic: Clinical - Medication Prior Auth >> Nov 30, 2023  2:22 PM Chasity T wrote: Reason for CRM: Adrien from [facility] is calling in for prior authorization for zolpidem  (AMBIEN ) 5 MG tablet. They faxed over the paperwork as well.

## 2023-11-30 NOTE — Telephone Encounter (Signed)
 Copied from CRM [id number]. Topic: Clinical - Medication Prior Auth >> Nov 30, 2023  1:51 PM Aisha D wrote: Reason for CRM: Medicare Part B dept is calling to request PA for zolpidem  (AMBIEN ) 5 MG tablet. She stated that she will be faxing the request over to the office today.

## 2023-11-30 NOTE — Telephone Encounter (Signed)
 Copied from CRM [id number]. Topic: Clinical - Medication Prior Auth >> Nov 30, 2023 11:02 AM Robinson H wrote: Reason for CRM: Therisa with [employer] and patient on line states patient needs prior authorization for the zolpidem  (AMBIEN ) 5 MG tablet, faxed prior authorization to office today as well. Office can also call provider line to give verbal approval.  Therisa Leyden Insurance [id number] Option 2  Javier King [phone]

## 2023-12-09 ENCOUNTER — Other Ambulatory Visit: Payer: Self-pay | Admitting: Family Medicine

## 2023-12-09 MED ORDER — METHYLPREDNISOLONE 4 MG PO TBPK: ORAL_TABLET | ORAL | 0 refills | Status: DC

## 2023-12-12 ENCOUNTER — Telehealth: Payer: Self-pay | Admitting: Urology

## 2023-12-12 NOTE — Telephone Encounter (Signed)
 Patient NO SHOWED last MRI appointment due to being in the hospital. Wants to reschedule and get this done.  Wanted to make sure that the patient still should/needs to get this prior to coming in and before authorizing.

## 2023-12-18 ENCOUNTER — Other Ambulatory Visit: Payer: Self-pay | Admitting: Family Medicine

## 2023-12-18 DIAGNOSIS — F411 Generalized anxiety disorder: Secondary | ICD-10-CM

## 2023-12-25 ENCOUNTER — Ambulatory Visit: Admitting: Family Medicine

## 2023-12-25 ENCOUNTER — Encounter: Payer: Self-pay | Admitting: Family Medicine

## 2023-12-25 VITALS — BP 120/78 | HR 66 | Temp 98.0°F | Resp 16 | Ht 73.0 in | Wt 222.8 lb

## 2023-12-25 DIAGNOSIS — G2581 Restless legs syndrome: Secondary | ICD-10-CM | POA: Diagnosis not present

## 2023-12-25 DIAGNOSIS — G4700 Insomnia, unspecified: Secondary | ICD-10-CM | POA: Diagnosis not present

## 2023-12-25 DIAGNOSIS — M5416 Radiculopathy, lumbar region: Secondary | ICD-10-CM

## 2023-12-25 MED ORDER — ZOLPIDEM TARTRATE 5 MG PO TABS: 5.0000 mg | ORAL_TABLET | Freq: Every evening | ORAL | 1 refills | Status: DC | PRN

## 2023-12-25 MED ORDER — GABAPENTIN 300 MG PO CAPS: 300.0000 mg | ORAL_CAPSULE | Freq: Every day | ORAL | 3 refills | Status: DC

## 2023-12-25 NOTE — Progress Notes (Signed)
 Chief Complaint  Patient presents with   Follow-up    Follow Up    Subjective: Patient is a 68 y.o. male here for follow-up.  Patient was seen around a month ago and started on higher doses of trazodone  at 150 mg nightly.  This has helped with his anxiety type symptoms.  He is still sleeping poorly but relates this to pain.  He is having right-sided lower back pain radiating into his extremity.  He has a set of stretches and exercises which he has been doing.  He has poor flexibility.  No loss of bowel/bladder control.  He does have a history of RLS which seems to be worsening over the past 6 weeks.  He has never taken any medication for this.  Sitting still seems to exacerbate his desire to move.  He also has tingling of his right pinky finger and associated grip strength loss.  All of his musculoskeletal issues are stemming from a fall he had 6 weeks ago.  He is healing slower than he would like.  He took 2 Medrol  Dosepak's which did help but did not completely resolve the issue as it seems to have done previously.  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   Cancer (HCC) 7982,7986   skin cancer   DDD (degenerative disc disease), lumbar    Depression 01/14/2016   Edema of both lower extremities    Erectile dysfunction    Essential hypertension 01/14/2016   Hepatic steatosis    History of TIA (transient ischemic attack)    per pt/ unsure if had TIA    Hyperlipidemia    Lower back pain    Pericarditis 1986   Tubular adenoma of colon    02/17/10    Objective: BP 120/78 (BP Location: Left Arm, Patient Position: Sitting)   Pulse 66   Temp 98 F (36.7 C) (Oral)   Resp 16   Ht 6' 1 (1.854 m)   Wt 222 lb 12.8 oz (101.1 kg)   SpO2 98%   BMI 29.39 kg/m  General: Awake, appears stated age MSK: TTP over the right lumbar paraspinal musculature without obvious deformity.  Poor  hamstring range of motion bilaterally. Neuro: Straight leg is negative, DTRs 0/4 throughout, no clonus, no cerebellar signs, negative Tinel's over the ulnar nerve at the elbow Lungs: No accessory muscle use Psych: Age appropriate judgment and insight, normal affect and mood  Assessment and Plan: Insomnia, unspecified type - Plan: zolpidem  (AMBIEN ) 5 MG tablet  RLS (restless legs syndrome)  Lumbar radiculopathy  Chronic, stable. Cont Ambien  5 mg nightly as needed, trazodone  150 mg nightly. Chronic, not controlled.  Start gabapentin  300 to 600 mg nightly.  Perhaps this would help with #3 as well.  Follow-up in 1 month to recheck this. Needs improved flexibility.  Heat, ice, Tylenol , stretches and exercises.  Consider physical therapy if no improvement in next month.  We will see how gabapentin  done as well.  Would consider specialty referral also. The patient voiced understanding and agreement to the plan.  Mabel [place], DO 12/25/23  9:04 AM

## 2023-12-25 NOTE — Patient Instructions (Addendum)
 Ice/cold pack over area for 10-15 min twice daily.  Heat (pad or rice pillow in microwave) over affected area, 10-15 minutes twice daily.   Take the gabapentin  30-60 min before bed.   Wear the brace at night. Ok to use during aggravating activities.   Let us  know if you need anything.

## 2023-12-26 ENCOUNTER — Encounter: Payer: Self-pay | Admitting: Family Medicine

## 2023-12-30 ENCOUNTER — Encounter: Payer: Self-pay | Admitting: Family Medicine

## 2023-12-31 ENCOUNTER — Other Ambulatory Visit (HOSPITAL_COMMUNITY): Payer: Self-pay

## 2023-12-31 ENCOUNTER — Telehealth: Payer: Self-pay

## 2023-12-31 NOTE — Telephone Encounter (Signed)
 Pharmacy Patient Advocate Encounter   Received notification from Patient Advice Request messages that prior authorization for Zolpidem  5mg  is required/requested.   Insurance verification completed.   The patient is insured through [facility] .

## 2023-12-31 NOTE — Telephone Encounter (Signed)
 Pharmacy Patient Advocate Encounter   Received notification from Patient Advice Request messages that prior authorization for Zolpidem  5mg  is required/requested.   Insurance verification completed.   The patient is insured through [facility] .

## 2024-01-01 MED ORDER — DOXEPIN HCL 6 MG PO TABS: 6.0000 mg | ORAL_TABLET | Freq: Every evening | ORAL | 2 refills | Status: DC

## 2024-01-01 NOTE — Telephone Encounter (Signed)
 Sent pt message about doxepin . Let us  know in a few days how it works.

## 2024-01-01 NOTE — Addendum Note (Signed)
 Addended by: FRANN MABEL SQUIBB on: 01/01/2024 07:40 AM   Modules accepted: Orders

## 2024-01-01 NOTE — Telephone Encounter (Signed)
 I sent pt message back in 5/23, patient must try doxepin  fist per insurance. I sent him another message  Letting him know and advised him to let us  know if he would like to try doxepin .

## 2024-01-01 NOTE — Telephone Encounter (Signed)
 I will send doxepin . Let us  know in a few days how it works.

## 2024-01-02 ENCOUNTER — Other Ambulatory Visit: Payer: Self-pay

## 2024-01-02 MED ORDER — DOXEPIN HCL 10 MG PO CAPS: ORAL_CAPSULE | ORAL | 1 refills | Status: DC

## 2024-01-02 NOTE — Telephone Encounter (Signed)
 Copied from CRM [id number]. Topic: Clinical - Prescription Issue >> Jan 02, 2024 11:53 AM Deleta RAMAN wrote: Reason for CRM: patient is calling to see if the dosage can be higher  for Doxepin  to be more affordable from 6 Mg to 10 MG. Please contact the patient at [phone]

## 2024-01-25 ENCOUNTER — Other Ambulatory Visit: Payer: Self-pay | Admitting: Medical Genetics

## 2024-01-30 ENCOUNTER — Ambulatory Visit: Admitting: Family Medicine

## 2024-01-30 ENCOUNTER — Encounter: Payer: Self-pay | Admitting: Family Medicine

## 2024-01-30 VITALS — BP 126/82 | HR 68 | Temp 98.0°F | Resp 16 | Ht 73.0 in | Wt 234.0 lb

## 2024-01-30 DIAGNOSIS — N529 Male erectile dysfunction, unspecified: Secondary | ICD-10-CM

## 2024-01-30 DIAGNOSIS — G4700 Insomnia, unspecified: Secondary | ICD-10-CM

## 2024-01-30 MED ORDER — TADALAFIL 20 MG PO TABS: ORAL_TABLET | ORAL | 0 refills | Status: DC

## 2024-01-30 NOTE — Patient Instructions (Signed)
I recommend getting the flu shot in mid October. This suggestion would change if the CDC comes out with a different recommendation.   Keep the diet clean and stay active.   Let us know if you need anything. 

## 2024-01-30 NOTE — Progress Notes (Signed)
 Chief Complaint  Patient presents with   Follow-up    Follow Up    Subjective: Patient is a 68 y.o. male here for f/u.  Patient was started on doxepin  10 mg nightly as needed.  He takes trazodone  150 mg daily for depression/anxiety which is helpful.  He stopped taking Ambien  when insurance stopped covering it.  When he takes doxepin , it does work and he sleeps well.  He has not needed to take it in the last 2 weeks.  He is very pleased with his new medication.  No adverse effects when he does take it.  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   Cancer (HCC) 7982,7986   skin cancer   DDD (degenerative disc disease), lumbar    Depression 01/14/2016   Edema of both lower extremities    Erectile dysfunction    Essential hypertension 01/14/2016   Hepatic steatosis    History of TIA (transient ischemic attack)    per pt/ unsure if had TIA    Hyperlipidemia    Lower back pain    Pericarditis 1986   Tubular adenoma of colon    02/17/10    Objective: BP 126/82 (BP Location: Left Arm, Patient Position: Sitting)   Pulse 68   Temp 98 F (36.7 C) (Oral)   Resp 16   Ht 6' 1 (1.854 m)   Wt 234 lb (106.1 kg)   SpO2 99%   BMI 30.87 kg/m  General: Awake, appears stated age Heart: RRR, no LE edema Lungs: CTAB, no rales, wheezes or rhonchi. No accessory muscle use Psych: Age appropriate judgment and insight, normal affect and mood  Assessment and Plan: Insomnia, unspecified type  Erectile dysfunction, unspecified erectile dysfunction type  Chronic, stable.  Continue doxepin  10 mg nightly as needed.  Follow-up as originally scheduled. The patient voiced understanding and agreement to the plan.  Mabel Mt [place], DO 01/30/24  12:18 PM

## 2024-02-01 ENCOUNTER — Other Ambulatory Visit

## 2024-02-05 ENCOUNTER — Encounter: Payer: Self-pay | Admitting: Emergency Medicine

## 2024-02-13 ENCOUNTER — Telehealth: Payer: Self-pay | Admitting: Family Medicine

## 2024-02-13 NOTE — Telephone Encounter (Signed)
 Copied from CRM [id number]. Topic: Medicare AWV >> Feb 13, 2024  2:33 PM Nathanel DEL wrote: Reason for CRM: Called LVM 02/13/2024 to schedule AWV. Please schedule office or virtual visits  Nathanel Paschal; Care Guide Ambulatory Clinical Support [place] [facility] Direct Dial: [phone]

## 2024-02-16 ENCOUNTER — Other Ambulatory Visit: Payer: Self-pay | Admitting: Family Medicine

## 2024-02-16 DIAGNOSIS — F411 Generalized anxiety disorder: Secondary | ICD-10-CM

## 2024-02-28 ENCOUNTER — Ambulatory Visit
Admission: RE | Admit: 2024-02-28 | Discharge: 2024-02-28 | Disposition: A | Source: Ambulatory Visit | Attending: Urology | Admitting: Urology

## 2024-02-28 ENCOUNTER — Other Ambulatory Visit: Payer: Self-pay | Admitting: Family Medicine

## 2024-02-28 DIAGNOSIS — R972 Elevated prostate specific antigen [PSA]: Secondary | ICD-10-CM

## 2024-02-28 MED ORDER — GADOPICLENOL 0.5 MMOL/ML IV SOLN
10.0000 mL | Freq: Once | INTRAVENOUS | Status: AC | PRN
  Administered 2024-02-28: 10 mL via INTRAVENOUS

## 2024-03-09 NOTE — Progress Notes (Unsigned)
  Electrophysiology Office Note:   Date:  03/10/2024  ID:  Javier King, DOB 1956/04/22, MRN [id number]  Primary Cardiologist: Soyla DELENA Merck, MD Electrophysiologist: Soyla Gladis Norton, MD   Electrophysiologist:  Soyla Gladis Norton, MD      History of Present Illness:   Javier King is a 68 y.o. male with h/o Anxiety, depression, HTN, ?TIA, HLD,  pericarditis, and AF s/p ablation 07/2023 seen today for routine electrophysiology followup.   Since last being seen in our clinic the patient reports doing OK. He was laid off about a year ago so stopped Eliquis  once his prescription ran out. He does have Medicare. He is looking for work. No breakthrough arrhythmia of which he is aware. Otherwise, he denies chest pain, palpitations, dyspnea, PND, orthopnea, nausea, vomiting, dizziness, syncope, edema, weight gain, or early satiety.   Review of systems complete and found to be negative unless listed in HPI.   EP Information / Studies Reviewed:    EKG is ordered today. Personal review as below.  EKG Interpretation Date/Time:  Monday March 10 2024 08:55:17 EDT Ventricular Rate:  50 PR Interval:  230 QRS Duration:  100 QT Interval:  440 QTC Calculation: 401 R Axis:   8  Text Interpretation: Sinus bradycardia with 1st degree A-V block Confirmed by Lesia Ozell [phone]) on 03/10/2024 9:03:12 AM    Arrhythmia/Device History No specialty comments available.   Physical Exam:   VS:  BP 126/70 (BP Location: Left Arm, Patient Position: Sitting)   Pulse (!) 50   Ht 6' 1 (1.854 m)   Wt 235 lb 9.6 oz (106.9 kg)   SpO2 100%   BMI 31.08 kg/m    Wt Readings from Last 3 Encounters:  03/10/24 235 lb 9.6 oz (106.9 kg)  01/30/24 234 lb (106.1 kg)  12/25/23 222 lb 12.8 oz (101.1 kg)     GEN: No acute distress NECK: No JVD; No carotid bruits CARDIAC: Regular rate and rhythm, no murmurs, rubs, gallops RESPIRATORY:  Clear to auscultation without rales, wheezing or rhonchi   ABDOMEN: Soft, non-tender, non-distended EXTREMITIES:  No edema; No deformity   ASSESSMENT AND PLAN:    Persistent AF S/p ablation 07/2023 EKG today shows sinus bradycardia CHA2DS2VASc of at least 4 given ? TIA Off eliquis  due to cost Will start pradaxa 150 mg BID. Labs today and 1 month  Secondary hypercoagulable state Pt on Eliquis  as above    HTN Stable on current regimen   Follow up with EP Team in 6 months  Signed, Ozell Prentice Lesia, PA-C

## 2024-03-10 ENCOUNTER — Encounter: Payer: Self-pay | Admitting: Student

## 2024-03-10 ENCOUNTER — Ambulatory Visit: Attending: Student | Admitting: Student

## 2024-03-10 ENCOUNTER — Other Ambulatory Visit: Payer: Self-pay | Admitting: *Deleted

## 2024-03-10 VITALS — BP 126/70 | HR 50 | Ht 73.0 in | Wt 235.6 lb

## 2024-03-10 DIAGNOSIS — I4819 Other persistent atrial fibrillation: Secondary | ICD-10-CM | POA: Insufficient documentation

## 2024-03-10 DIAGNOSIS — I10 Essential (primary) hypertension: Secondary | ICD-10-CM | POA: Insufficient documentation

## 2024-03-10 DIAGNOSIS — D6869 Other thrombophilia: Secondary | ICD-10-CM | POA: Insufficient documentation

## 2024-03-10 DIAGNOSIS — Z79899 Other long term (current) drug therapy: Secondary | ICD-10-CM | POA: Diagnosis present

## 2024-03-10 MED ORDER — DABIGATRAN ETEXILATE MESYLATE 150 MG PO CAPS: 150.0000 mg | ORAL_CAPSULE | Freq: Two times a day (BID) | ORAL | 11 refills | Status: AC

## 2024-03-10 NOTE — Patient Instructions (Signed)
 Medication Instructions:  1.Stop eliquis  2.Start pradaxa 150 mg twice daily *If you need a refill on your cardiac medications before your next appointment, please call your pharmacy*  Lab Work: BMET, CBC-TODAY BMET, CBC-IN 1 MONTH If you have labs (blood work) drawn today and your tests are completely normal, you will receive your results only by: MyChart Message (if you have MyChart) OR A paper copy in the mail If you have any lab test that is abnormal or we need to change your treatment, we will call you to review the results.  Follow-Up: At [facility], you and your health needs are our priority.  As part of our continuing mission to provide you with exceptional heart care, our providers are all part of one team.  This team includes your primary Cardiologist (physician) and Advanced Practice Providers or APPs (Physician Assistants and Nurse Practitioners) who all work together to provide you with the care you need, when you need it.  Your next appointment:   6 month(s)  Provider:   Soyla Norton, MD or Ozell Jodie Passey, PA-C

## 2024-03-11 ENCOUNTER — Other Ambulatory Visit (HOSPITAL_BASED_OUTPATIENT_CLINIC_OR_DEPARTMENT_OTHER): Payer: Self-pay

## 2024-03-11 ENCOUNTER — Ambulatory Visit: Payer: Self-pay | Admitting: Student

## 2024-03-11 LAB — BASIC METABOLIC PANEL WITH GFR
BUN/Creatinine Ratio: 26 — ABNORMAL HIGH (ref 10–24)
BUN: 25 mg/dL (ref 8–27)
CO2: 21 mmol/L (ref 20–29)
Calcium: 9.8 mg/dL (ref 8.6–10.2)
Chloride: 102 mmol/L (ref 96–106)
Creatinine, Ser: 0.95 mg/dL (ref 0.76–1.27)
Glucose: 86 mg/dL (ref 70–99)
Potassium: 5 mmol/L (ref 3.5–5.2)
Sodium: 138 mmol/L (ref 134–144)
eGFR: 88 mL/min/1.73 (ref >59)

## 2024-03-11 LAB — CBC
Hematocrit: 39.8 % (ref 37.5–51.0)
Hemoglobin: 12.9 g/dL — ABNORMAL LOW (ref 13.0–17.7)
MCH: 30.4 pg (ref 26.6–33.0)
MCHC: 32.4 g/dL (ref 31.5–35.7)
MCV: 94 fL (ref 79–97)
Platelets: 246 x10E3/uL (ref 150–450)
RBC: 4.25 x10E6/uL (ref 4.14–5.80)
RDW: 12.6 % (ref 11.6–15.4)
WBC: 6.7 x10E3/uL (ref 3.4–10.8)

## 2024-03-11 MED ORDER — COMIRNATY 30 MCG/0.3ML IM SUSY
0.3000 mL | PREFILLED_SYRINGE | Freq: Once | INTRAMUSCULAR | 0 refills | Status: AC
  Filled 2024-03-11: qty 0.3, 1d supply, fill #0

## 2024-03-11 MED ORDER — FLUZONE HIGH-DOSE 0.5 ML IM SUSY
0.5000 mL | PREFILLED_SYRINGE | Freq: Once | INTRAMUSCULAR | 0 refills | Status: AC
  Filled 2024-03-11: qty 0.5, 1d supply, fill #0

## 2024-03-14 ENCOUNTER — Other Ambulatory Visit: Payer: Self-pay | Admitting: Family Medicine

## 2024-03-14 DIAGNOSIS — F411 Generalized anxiety disorder: Secondary | ICD-10-CM

## 2024-03-25 ENCOUNTER — Other Ambulatory Visit: Payer: Self-pay | Admitting: Family Medicine

## 2024-04-03 ENCOUNTER — Ambulatory Visit (INDEPENDENT_AMBULATORY_CARE_PROVIDER_SITE_OTHER): Admitting: Urology

## 2024-04-03 ENCOUNTER — Encounter: Payer: Self-pay | Admitting: Urology

## 2024-04-03 VITALS — BP 199/64 | HR 60 | Ht 73.0 in | Wt 235.0 lb

## 2024-04-03 DIAGNOSIS — R972 Elevated prostate specific antigen [PSA]: Secondary | ICD-10-CM

## 2024-04-03 LAB — URINALYSIS, ROUTINE W REFLEX MICROSCOPIC
Bilirubin, UA: NEGATIVE
Glucose, UA: NEGATIVE
Ketones, UA: NEGATIVE
Leukocytes,UA: NEGATIVE
Nitrite, UA: NEGATIVE
Protein,UA: NEGATIVE
Specific Gravity, UA: 1.015 (ref 1.005–1.030)
Urobilinogen, Ur: 0.2 mg/dL (ref 0.2–1.0)
pH, UA: 5.5 (ref 5.0–7.5)

## 2024-04-03 LAB — MICROSCOPIC EXAMINATION: Bacteria, UA: NONE SEEN

## 2024-04-03 NOTE — Progress Notes (Signed)
 Assessment: 1. Elevated PSA     Plan: I personally reviewed the patient's chart including provider notes, lab and imaging results. I reviewed the results of the MRI from October 2025 with the patient today.  I also discussed the limitations of prostate MRI in diagnosing prostate cancer.  I discussed other diagnostic tools including urine biomarker testing and further evaluation with a prostate biopsy. PSA today-will call with results. Return to office in 6 months.  Chief Complaint: Chief Complaint  Patient presents with   Elevated PSA    HPI: Javier King is a 68 y.o. male who presents for continued evaluation of elevated PSA. He was previously followed by Dr. Shona with his last visit in April 2025. He has a history of elevated PSA. PSA data: 09/2020             1.8 09/2021             1.8 11/2022             2.5 03/2023 5.86 12/24  ExoDx - low at 11.6 4/25  5.7 18% free  MRI from 02/29/2024 showed a volume of 42 cm and no findings of intermediate or high-grade prostate cancer.  He returns today for follow-up.  He is not having any significant lower urinary tract symptoms other than occasional frequency.  No dysuria or gross hematuria.  Portions of the above documentation were copied from a prior visit for review purposes only.  Allergies: Allergies  Allergen Reactions   Penicillins Itching    PMH: 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   Cancer (HCC) 7982,7986   skin cancer   DDD (degenerative disc disease), lumbar    Depression 01/14/2016   Edema of both lower extremities    Erectile dysfunction    Essential hypertension 01/14/2016   Hepatic steatosis    History of TIA (transient ischemic attack)    per pt/ unsure if had TIA    Hyperlipidemia    Lower back pain    Pericarditis 1986   Tubular adenoma of colon    02/17/10    PSH: Past Surgical  History:  Procedure Laterality Date   ATRIAL FIBRILLATION ABLATION N/A 08/01/2023   Procedure: ATRIAL FIBRILLATION ABLATION;  Surgeon: Inocencio Soyla Lunger, MD;  Location: [facility];  Service: Cardiovascular;  Laterality: N/A;   COLONOSCOPY     OTHER SURGICAL HISTORY  2003,2004   Back surgery/ 2 laminectomies in lower back   TONSILLECTOMY      SH: Social History   Tobacco Use   Smoking status: Never   Smokeless tobacco: Never  Vaping Use   Vaping status: Never Used  Substance Use Topics   Alcohol use: Yes    Alcohol/week: 3.0 standard drinks of alcohol    Types: 1 Glasses of wine, 1 Cans of beer, 1 Shots of liquor per week    Comment: occassional   Drug use: No    ROS: Constitutional:  Negative for fever, chills, weight loss CV: Negative for chest pain, previous MI, hypertension Respiratory:  Negative for shortness of breath, wheezing, sleep apnea, frequent cough GI:  Negative for nausea, vomiting, bloody stool, GERD  PE: BP (!) 199/64   Pulse 60   Ht 6' 1 (1.854 m)   Wt 235 lb (106.6 kg)   BMI 31.00 kg/m  GENERAL APPEARANCE:  Well appearing, well developed, well  nourished, NAD HEENT:  Atraumatic, normocephalic, oropharynx clear NECK:  Supple without lymphadenopathy or thyromegaly ABDOMEN:  Soft, non-tender, no masses EXTREMITIES:  Moves all extremities well, without clubbing, cyanosis, or edema NEUROLOGIC:  Alert and oriented x 3, normal gait, CN II-XII grossly intact MENTAL STATUS:  appropriate BACK:  Non-tender to palpation, No CVAT SKIN:  Warm, dry, and intact   Results: U/A: 0-5 WBCs, 0-2 RBCs

## 2024-04-04 ENCOUNTER — Ambulatory Visit: Payer: Self-pay | Admitting: Urology

## 2024-04-04 LAB — PSA: Prostate Specific Ag, Serum: 5.2 ng/mL — ABNORMAL HIGH (ref 0.0–4.0)

## 2024-04-11 ENCOUNTER — Encounter: Payer: Self-pay | Admitting: Family Medicine

## 2024-04-11 ENCOUNTER — Ambulatory Visit (INDEPENDENT_AMBULATORY_CARE_PROVIDER_SITE_OTHER): Admitting: *Deleted

## 2024-04-11 ENCOUNTER — Ambulatory Visit: Admitting: Family Medicine

## 2024-04-11 ENCOUNTER — Ambulatory Visit: Payer: Self-pay | Admitting: Family Medicine

## 2024-04-11 VITALS — BP 134/82 | HR 64 | Temp 98.0°F | Resp 16 | Ht 72.0 in | Wt 238.0 lb

## 2024-04-11 VITALS — BP 134/82 | HR 64 | Temp 98.0°F | Resp 16 | Ht 73.0 in | Wt 238.4 lb

## 2024-04-11 DIAGNOSIS — G4700 Insomnia, unspecified: Secondary | ICD-10-CM

## 2024-04-11 DIAGNOSIS — I10 Essential (primary) hypertension: Secondary | ICD-10-CM

## 2024-04-11 DIAGNOSIS — Z0000 Encounter for general adult medical examination without abnormal findings: Secondary | ICD-10-CM

## 2024-04-11 LAB — COMPREHENSIVE METABOLIC PANEL WITH GFR
ALT: 17 U/L (ref 0–53)
AST: 19 U/L (ref 0–37)
Albumin: 4.3 g/dL (ref 3.5–5.2)
Alkaline Phosphatase: 45 U/L (ref 39–117)
BUN: 26 mg/dL — ABNORMAL HIGH (ref 6–23)
CO2: 29 meq/L (ref 19–32)
Calcium: 9.8 mg/dL (ref 8.4–10.5)
Chloride: 101 meq/L (ref 96–112)
Creatinine, Ser: 1.07 mg/dL (ref 0.40–1.50)
GFR: 72.48 mL/min (ref >60.00)
Glucose, Bld: 88 mg/dL (ref 70–99)
Potassium: 4.6 meq/L (ref 3.5–5.1)
Sodium: 136 meq/L (ref 135–145)
Total Bilirubin: 0.8 mg/dL (ref 0.2–1.2)
Total Protein: 6.8 g/dL (ref 6.0–8.3)

## 2024-04-11 LAB — LIPID PANEL
Cholesterol: 162 mg/dL (ref 0–200)
HDL: 38 mg/dL — ABNORMAL LOW (ref >39.00)
LDL Cholesterol: 107 mg/dL — ABNORMAL HIGH (ref 0–99)
NonHDL: 124.46
Total CHOL/HDL Ratio: 4
Triglycerides: 85 mg/dL (ref 0.0–149.0)
VLDL: 17 mg/dL (ref 0.0–40.0)

## 2024-04-11 NOTE — Patient Instructions (Signed)
 Give us  2-3 business days to get the results of your labs back.   Keep the diet clean and stay active.  Let us  know if you need anything.

## 2024-04-11 NOTE — Progress Notes (Signed)
 Chief Complaint  Patient presents with   Medicare Wellness     Subjective:   Javier King is a 68 y.o. male who presents for a Medicare Annual Wellness Visit.  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   Cancer (HCC) 7982,7986   skin cancer   DDD (degenerative disc disease), lumbar    Depression 01/14/2016   Edema of both lower extremities    Erectile dysfunction    Essential hypertension 01/14/2016   Hepatic steatosis    History of TIA (transient ischemic attack)    per pt/ unsure if had TIA    Hyperlipidemia    Lower back pain    Pericarditis 1986   Tubular adenoma of colon    02/17/10   Past Surgical History:  Procedure Laterality Date   ATRIAL FIBRILLATION ABLATION N/A 08/01/2023   Procedure: ATRIAL FIBRILLATION ABLATION;  Surgeon: Inocencio Soyla Lunger, MD;  Location: [facility];  Service: Cardiovascular;  Laterality: N/A;   COLONOSCOPY     OTHER SURGICAL HISTORY  2003,2004   Back surgery/ 2 laminectomies in lower back   TONSILLECTOMY     Family History  Problem Relation Age of Onset   Heart disease Mother    Thyroid  disease Mother    Cancer Mother    Alzheimer's disease Father    Parkinsonism Father    Diabetes Sister    Leukemia Sister    Arthritis Brother    Colon cancer Neg Hx    Colon polyps Neg Hx    Social History   Occupational History   Occupation: 3D Agricultural Engineer  Tobacco Use   Smoking status: Never   Smokeless tobacco: Never  Vaping Use   Vaping status: Never Used  Substance and Sexual Activity   Alcohol use: Yes    Alcohol/week: 3.0 standard drinks of alcohol    Types: 1 Glasses of wine, 1 Cans of beer, 1 Shots of liquor per week    Comment: occassional   Drug use: No   Sexual activity: Not Currently   Tobacco Counseling Counseling given: Not Answered  SDOH Screenings    Food Insecurity: No Food Insecurity (04/04/2024)  Recent Concern: Food Insecurity - Food Insecurity Present (04/04/2024)  Housing: Low Risk  (04/04/2024)  Transportation Needs: No Transportation Needs (04/04/2024)  Utilities: Not At Risk (04/11/2024)  Alcohol Screen: Low Risk  (04/04/2024)  Depression (PHQ2-9): Low Risk  (04/11/2024)  Financial Resource Strain: Medium Risk (04/04/2024)  Physical Activity: Insufficiently Active (04/04/2024)  Social Connections: Socially Isolated (04/04/2024)  Stress: No Stress Concern Present (04/04/2024)  Tobacco Use: Low Risk  (04/11/2024)   See flowsheets for full screening details  Depression Screen PHQ 2 & 9 Depression Scale- Over the past 2 weeks, how often have you been bothered by any of the following problems? Little interest or pleasure in doing things: 0 Feeling down, depressed, or hopeless (PHQ Adolescent also includes...irritable): 0 PHQ-2 Total Score: 0 Trouble falling or staying asleep, or sleeping too much: 0 Feeling tired or having little energy: 0 Poor appetite or overeating (PHQ Adolescent also includes...weight loss): 0 Feeling bad about yourself - or that you are a failure or have let yourself or your family down: 0 Trouble concentrating on things, such as reading the newspaper or watching television (PHQ Adolescent also includes...like school work): 0 Moving or speaking so slowly that other people  could have noticed. 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         Visit info / Clinical Intake: Medicare Wellness Visit Type:: Initial Annual Wellness Visit Persons participating in visit:: patient Medicare Wellness Visit Mode:: In-person (required for WTM) Information given by:: patient Interpreter Needed?: No Pre-visit prep was completed: yes AWV questionnaire completed by patient prior to visit?: yes Date:: 04/04/24 Living arrangements:: (!) lives alone Patient's Overall Health Status Rating: good Typical amount of pain: some Does pain affect daily life?: no Are you currently prescribed opioids?: no  Dietary Habits and Nutritional Risks How many meals a day?: 3 Eats fruit and vegetables daily?: yes Most meals are obtained by: preparing own meals In the last 2 weeks, have you had any of the following?: none Diabetic:: no  Functional Status Activities of Daily Living (to include ambulation/medication): (Patient-Rptd) Independent Ambulation: (Patient-Rptd) Independent Medication Administration: Independent Home Management: (Patient-Rptd) Independent Manage your own finances?: yes Primary transportation is: driving Concerns about vision?: no *vision screening is required for WTM* (sees [facility] every 18 months) Concerns about hearing?: no (has noticed slight decrease)  Fall Screening Falls in the past year?: 0 Number of falls in past year: 0 Was there an injury with Fall?: 0 Fall Risk Category Calculator: 0 Patient Fall Risk Level: Low Fall Risk  Fall Risk Patient at Risk for Falls Due to: No Fall Risks Fall risk Follow up: Falls evaluation completed  Home and Transportation Safety: All rugs have non-skid backing?: (!) no All stairs or steps have railings?: N/A, no stairs Grab bars in the bathtub or shower?: (!) no Have non-skid surface in bathtub or shower?: (!) no Good home lighting?: yes Regular seat belt use?: yes Hospital stays in the last year:: no  Cognitive  Assessment Difficulty concentrating, remembering, or making decisions? : no Will 6CIT or Mini Cog be Completed: yes What year is it?: 0 points What month is it?: 0 points Give patient an address phrase to remember (5 components): [address], [place]  About what time is it?: 0 points Count backwards from 20 to 1: 0 points Say the months of the year in reverse: 4 points Repeat the address phrase from earlier: 0 points 6 CIT Score: 4 points  Advance Directives (For Healthcare) Does Patient Have a Medical Advance Directive?: Yes Does patient want to make changes to medical advance directive?: No - Patient declined Type of Advance Directive: Healthcare Power of [place]; Living will Copy of Healthcare Power of Attorney in Chart?: Yes - validated most recent copy scanned in chart (See row information) Copy of Living Will in Chart?: Yes - validated most recent copy scanned in chart (See row information)  Reviewed/Updated  Reviewed/Updated: Reviewed All (Medical, Surgical, Family, Medications, Allergies, Care Teams, Patient Goals)        Objective:  Today's Vitals   04/11/24 0939  BP: 134/82  Pulse: 64  Resp: 16  Temp: 98 F (36.7 C)  TempSrc: Oral  SpO2: 98%  Weight: 238 lb (108 kg)  Height: 6' (1.829 m)   Body mass index is 32.28 kg/m.  Current Medications (verified) Outpatient Encounter Medications as of 04/11/2024  Medication Sig   dabigatran  (PRADAXA ) 150 MG CAPS capsule Take 1 capsule (150 mg total) by mouth 2 (two) times daily.   doxepin  (SINEQUAN ) 10 MG capsule Take 1 capsule (10 mg total) by mouth at bedtime.   gabapentin  (NEURONTIN ) 300 MG capsule Take 1-2 capsules (300-600 mg total) by mouth at bedtime.   lisinopril  (ZESTRIL ) 40 MG tablet Take 1 tablet (40 mg total) by mouth daily.   Multiple Vitamin (MULTIVITAMIN WITH MINERALS) TABS tablet Take 1 tablet by mouth daily.   Omega-3 Fatty Acids (OMEGA 3 PO) Take 1 capsule by mouth daily.   OVER THE  COUNTER MEDICATION Take 1 Scoop by mouth daily.   pantoprazole  (PROTONIX ) 40 MG tablet Take 1 tablet (40 mg total) by mouth daily before breakfast.   propranolol  (INDERAL ) 10 MG tablet TAKE 1-2 TABS BY MOUTH 3 TIMES DAILY AS NEEDED FOR ANXIETY/PANIC ATTACKS.   tadalafil  (CIALIS ) 20 MG tablet TAKE 1/2 TO 1 (ONE-HALF TO ONE) TABLET BY MOUTH EVERY OTHER DAY AS NEEDED FOR  ERECTILE  DYSFUNCTION   traZODone  (DESYREL ) 150 MG tablet TAKE 1 TABLET BY MOUTH AT BEDTIME.   No facility-administered encounter medications on file as of 04/11/2024.   Hearing/Vision screen No results found. Immunizations and Health Maintenance Health Maintenance  Topic Date Due   COVID-19 Vaccine (8 - Mixed Product risk 2025-26 season) 09/09/2024   Medicare Annual Wellness (AWV)  04/11/2025   Colonoscopy  05/26/2027   DTaP/Tdap/Td (3 - Td or Tdap) 11/04/2033   Pneumococcal Vaccine: 50+ Years  Completed   Influenza Vaccine  Completed   Hepatitis C Screening  Completed   Zoster Vaccines- Shingrix   Completed   Meningococcal B Vaccine  Aged Out        Assessment/Plan:  This is a routine wellness examination for Derin.  Patient Care Team: Frann Mabel Mt, DO as PCP - General (Family Medicine) Loni Soyla LABOR, MD as PCP - Cardiology (Cardiology) Inocencio Soyla Lunger, MD as PCP - Electrophysiology (Cardiology)  I have personally reviewed and noted the following in the patient's chart:   Medical and social history Use of alcohol, tobacco or illicit drugs  Current medications and supplements including opioid prescriptions. Functional ability and status Nutritional status Physical activity Advanced directives List of other physicians Hospitalizations, surgeries, and ER visits in previous 12 months Vitals Screenings to include cognitive, depression, and falls Referrals and appointments  No orders of the defined types were placed in this encounter.  In addition, I have reviewed and discussed with  patient certain preventive protocols, quality metrics, and best practice recommendations. A written personalized care plan for preventive services as well as general preventive health recommendations were provided to patient.   Lolita Libra, CMA   04/11/2024   Return in 1 year (on 04/11/2025).  After Visit Summary: (In Person-Printed) AVS printed and given to the patient  Nurse Notes: nothing significant to report

## 2024-04-11 NOTE — Patient Instructions (Addendum)
 [name],  Thank you for taking the time for your Medicare Wellness Visit. I appreciate your continued commitment to your health goals. Please review the care plan we discussed, and feel free to reach out if I can assist you further.  Please note that Annual Wellness Visits do not include a physical exam. Some assessments may be limited, especially if the visit was conducted virtually. If needed, we may recommend an in-person follow-up with your provider.  Ongoing Care Seeing your primary care provider every 3 to 6 months helps us  monitor your health and provide consistent, personalized care.   Dr Frann:  10/10/24 4pm Medicare AWV: 04/15/25 3pm, in person  Referrals If a referral was made during today's visit and you haven't received any updates within two weeks, please contact the referred provider directly to check on the status.  If your financial situation changes, Please look online at [email or web address] for food / utility assistance etc.  Recommended Screenings:  Health Maintenance  Topic Date Due   Medicare Annual Wellness Visit  Never done   COVID-19 Vaccine (8 - Mixed Product risk 2025-26 season) 09/09/2024   Colon Cancer Screening  05/26/2027   DTaP/Tdap/Td vaccine (3 - Td or Tdap) 11/04/2033   Pneumococcal Vaccine for age over 63  Completed   Flu Shot  Completed   Hepatitis C Screening  Completed   Zoster (Shingles) Vaccine  Completed   Meningitis B Vaccine  Aged Out       04/04/2024   10:37 AM  Advanced Directives  Does Patient Have a Medical Advance Directive? Yes  Type of Estate Agent of [place];Living will  Does patient want to make changes to medical advance directive? No - Patient declined  Copy of Healthcare Power of Attorney in Chart? Yes - validated most recent copy scanned in chart (See row information)    Vision: Annual vision screenings are recommended for early detection of glaucoma, cataracts, and diabetic retinopathy. These  exams can also reveal signs of chronic conditions such as diabetes and high blood pressure.  Dental: Annual dental screenings help detect early signs of oral cancer, gum disease, and other conditions linked to overall health, including heart disease and diabetes.  Please see the attached documents for additional preventive care recommendations.

## 2024-04-11 NOTE — Progress Notes (Signed)
 Chief Complaint  Patient presents with   Medication Refill    Medication Check    Subjective Javier King is a 68 y.o. male who presents for hypertension follow up. He does not monitor home blood pressures. He is compliant with medication- lisinopril  40 mg/d. Patient has these side effects of medication: none He is adhering to a healthy diet overall. Current exercise: walking No CP or SOB.   GAD/insomnia Taking trazodone  150 mg/d, compliant no AE's. Doxepin  10 mg qhs is helpful for sleep, could be giving him wt gain.  No homicidal or suicidal ideation.  No self-medication.  He is not seeing a therapist right now.   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   Cancer (HCC) 7982,7986   skin cancer   DDD (degenerative disc disease), lumbar    Depression 01/14/2016   Edema of both lower extremities    Erectile dysfunction    Essential hypertension 01/14/2016   Hepatic steatosis    History of TIA (transient ischemic attack)    per pt/ unsure if had TIA    Hyperlipidemia    Lower back pain    Pericarditis 1986   Tubular adenoma of colon    02/17/10    Exam BP 134/82 (BP Location: Left Arm, Patient Position: Sitting)   Pulse 64   Temp 98 F (36.7 C) (Oral)   Resp 16   Ht 6' 1 (1.854 m)   Wt 238 lb 6.4 oz (108.1 kg)   SpO2 98%   BMI 31.45 kg/m  General:  well developed, well nourished, in no apparent distress Heart: RRR, no bruits, no LE edema Lungs: clear to auscultation, no accessory muscle use Psych: well oriented with normal range of affect and appropriate judgment/insight  Essential hypertension - Plan: Comprehensive metabolic panel with GFR, Lipid panel  Insomnia, unspecified type  Chronic, stable.  Continue lisinopril  40 mg daily.  Counseled on diet and exercise. Chronic, stable.  Continue trazodone  150 mg nightly, doxepin  10 mg nightly as needed. F/u  in 6 mo. The patient voiced understanding and agreement to the plan.  [name], DO 04/11/24  12:19 PM

## 2024-04-20 ENCOUNTER — Other Ambulatory Visit: Payer: Self-pay | Admitting: Family Medicine

## 2024-05-05 ENCOUNTER — Other Ambulatory Visit: Payer: Self-pay | Admitting: Family Medicine

## 2024-05-08 ENCOUNTER — Other Ambulatory Visit: Payer: Self-pay

## 2024-05-08 MED ORDER — PANTOPRAZOLE SODIUM 40 MG PO TBEC: 40.0000 mg | DELAYED_RELEASE_TABLET | Freq: Every day | ORAL | 1 refills | Status: DC

## 2024-05-23 ENCOUNTER — Other Ambulatory Visit: Payer: Self-pay | Admitting: Family Medicine

## 2024-05-23 DIAGNOSIS — F411 Generalized anxiety disorder: Secondary | ICD-10-CM

## 2024-05-30 ENCOUNTER — Other Ambulatory Visit: Payer: Self-pay

## 2024-05-30 MED ORDER — LISINOPRIL 40 MG PO TABS: 40.0000 mg | ORAL_TABLET | Freq: Every day | ORAL | 1 refills | Status: AC

## 2024-06-05 ENCOUNTER — Other Ambulatory Visit: Payer: Self-pay | Admitting: Family Medicine

## 2024-06-10 ENCOUNTER — Other Ambulatory Visit: Payer: Self-pay | Admitting: Family Medicine

## 2024-06-10 ENCOUNTER — Encounter: Payer: Self-pay | Admitting: Family Medicine

## 2024-06-10 MED ORDER — ZOLPIDEM TARTRATE 5 MG PO TABS: 5.0000 mg | ORAL_TABLET | Freq: Every evening | ORAL | 1 refills | Status: AC | PRN

## 2024-06-11 ENCOUNTER — Telehealth: Payer: Self-pay

## 2024-06-11 ENCOUNTER — Other Ambulatory Visit: Payer: Self-pay

## 2024-06-11 DIAGNOSIS — F411 Generalized anxiety disorder: Secondary | ICD-10-CM

## 2024-06-11 MED ORDER — TRAZODONE HCL 150 MG PO TABS: 150.0000 mg | ORAL_TABLET | Freq: Two times a day (BID) | ORAL | 0 refills | Status: DC

## 2024-06-11 MED ORDER — PANTOPRAZOLE SODIUM 40 MG PO TBEC: 40.0000 mg | DELAYED_RELEASE_TABLET | Freq: Every day | ORAL | 1 refills | Status: AC

## 2024-06-11 NOTE — Telephone Encounter (Signed)
 Refill has been sent.

## 2024-06-11 NOTE — Telephone Encounter (Signed)
 Copied from CRM #[id number]. Topic: Clinical - Medication Refill >> Jun 11, 2024  1:11 PM Zebedee SAUNDERS wrote: Medication: propranolol  (INDERAL ) 10 MG tablet, traZODone  (DESYREL ) 150 MG tablet pantoprazole  (PROTONIX ) 40 MG tablet Has the patient contacted their pharmacy? Yes (Agent: If no, request that the patient contact the pharmacy for the refill. If patient does not wish to contact the pharmacy document the reason why and proceed with request.) (Agent: If yes, when and what did the pharmacy advise?)Pharmacy needs 90 day script  This is the patient's preferred pharmacy:   [facility] - [place], [place] - [address] [place] [address] Phone: [phone] Fax: [phone]  Is this the correct pharmacy for this prescription? Yes If no, delete pharmacy and type the correct one.   Has the prescription been filled recently? No  Is the patient out of the medication? Yes  Has the patient been seen for an appointment in the last year OR does the patient have an upcoming appointment? Yes  Can we respond through MyChart? Yes  Agent: Please be advised that Rx refills may take up to 3 business days. We ask that you follow-up with your pharmacy.

## 2024-06-18 ENCOUNTER — Other Ambulatory Visit: Payer: Self-pay

## 2024-06-18 ENCOUNTER — Telehealth: Payer: Self-pay

## 2024-06-18 DIAGNOSIS — F411 Generalized anxiety disorder: Secondary | ICD-10-CM

## 2024-06-18 MED ORDER — TRAZODONE HCL 150 MG PO TABS: 150.0000 mg | ORAL_TABLET | Freq: Every evening | ORAL | Status: AC | PRN

## 2024-06-18 MED ORDER — TRAZODONE HCL 150 MG PO TABS: 150.0000 mg | ORAL_TABLET | Freq: Every day | ORAL | 1 refills | Status: AC

## 2024-06-18 NOTE — Addendum Note (Signed)
 Addended by: FRANN MABEL SQUIBB on: 06/18/2024 11:50 AM   Modules accepted: Orders

## 2024-06-18 NOTE — Telephone Encounter (Signed)
 Trazodone  150 mg 1 tab at night as needed.

## 2024-06-18 NOTE — Telephone Encounter (Signed)
 Should be one tab nightly as needed.

## 2024-06-18 NOTE — Telephone Encounter (Signed)
 [facility] called about Trazodone  is it suppose be 1 tab or 2 times day at bedtime.

## 2024-10-01 ENCOUNTER — Ambulatory Visit: Admitting: Urology

## 2024-10-10 ENCOUNTER — Ambulatory Visit: Admitting: Family Medicine

## 2025-04-15 ENCOUNTER — Ambulatory Visit
